# Patient Record
Sex: Female | Born: 1935 | Race: White | Smoking: Never smoker
Health system: Northeastern US, Academic
[De-identification: ages and names within clinical notes are randomized; demographics above are authoritative.]

## PROBLEM LIST (undated history)

## (undated) DIAGNOSIS — M1990 Unspecified osteoarthritis, unspecified site: Secondary | ICD-10-CM

## (undated) DIAGNOSIS — I341 Nonrheumatic mitral (valve) prolapse: Secondary | ICD-10-CM

## (undated) DIAGNOSIS — G5603 Carpal tunnel syndrome, bilateral upper limbs: Secondary | ICD-10-CM

## (undated) DIAGNOSIS — G4733 Obstructive sleep apnea (adult) (pediatric): Secondary | ICD-10-CM

## (undated) DIAGNOSIS — K219 Gastro-esophageal reflux disease without esophagitis: Secondary | ICD-10-CM

## (undated) DIAGNOSIS — I10 Essential (primary) hypertension: Secondary | ICD-10-CM

## (undated) DIAGNOSIS — E559 Vitamin D deficiency, unspecified: Secondary | ICD-10-CM

## (undated) DIAGNOSIS — H269 Unspecified cataract: Secondary | ICD-10-CM

## (undated) DIAGNOSIS — Z5189 Encounter for other specified aftercare: Secondary | ICD-10-CM

## (undated) DIAGNOSIS — R0602 Shortness of breath: Secondary | ICD-10-CM

## (undated) DIAGNOSIS — I4719 Other supraventricular tachycardia: Secondary | ICD-10-CM

## (undated) DIAGNOSIS — E785 Hyperlipidemia, unspecified: Secondary | ICD-10-CM

## (undated) DIAGNOSIS — E669 Obesity, unspecified: Secondary | ICD-10-CM

## (undated) DIAGNOSIS — Z8719 Personal history of other diseases of the digestive system: Secondary | ICD-10-CM

## (undated) DIAGNOSIS — IMO0002 Reserved for concepts with insufficient information to code with codable children: Secondary | ICD-10-CM

## (undated) DIAGNOSIS — S129XXA Fracture of neck, unspecified, initial encounter: Secondary | ICD-10-CM

## (undated) DIAGNOSIS — R51 Headache: Secondary | ICD-10-CM

## (undated) DIAGNOSIS — W19XXXA Unspecified fall, initial encounter: Secondary | ICD-10-CM

## (undated) DIAGNOSIS — I340 Nonrheumatic mitral (valve) insufficiency: Secondary | ICD-10-CM

## (undated) DIAGNOSIS — IMO0001 Reserved for inherently not codable concepts without codable children: Secondary | ICD-10-CM

## (undated) DIAGNOSIS — S0990XA Unspecified injury of head, initial encounter: Secondary | ICD-10-CM

## (undated) DIAGNOSIS — M8580 Other specified disorders of bone density and structure, unspecified site: Secondary | ICD-10-CM

## (undated) DIAGNOSIS — G5602 Carpal tunnel syndrome, left upper limb: Secondary | ICD-10-CM

## (undated) DIAGNOSIS — K259 Gastric ulcer, unspecified as acute or chronic, without hemorrhage or perforation: Secondary | ICD-10-CM

## (undated) DIAGNOSIS — M75100 Unspecified rotator cuff tear or rupture of unspecified shoulder, not specified as traumatic: Secondary | ICD-10-CM

## (undated) DIAGNOSIS — J302 Other seasonal allergic rhinitis: Secondary | ICD-10-CM

## (undated) DIAGNOSIS — I272 Pulmonary hypertension, unspecified: Secondary | ICD-10-CM

## (undated) DIAGNOSIS — I2510 Atherosclerotic heart disease of native coronary artery without angina pectoris: Secondary | ICD-10-CM

## (undated) DIAGNOSIS — I050 Rheumatic mitral stenosis: Secondary | ICD-10-CM

## (undated) DIAGNOSIS — Z9989 Dependence on other enabling machines and devices: Secondary | ICD-10-CM

## (undated) HISTORY — DX: Unspecified osteoarthritis, unspecified site: M19.90

## (undated) HISTORY — DX: Essential (primary) hypertension: I10

## (undated) HISTORY — DX: Unspecified cataract: H26.9

## (undated) HISTORY — DX: Gastro-esophageal reflux disease without esophagitis: K21.9

## (undated) HISTORY — DX: Nonrheumatic mitral (valve) prolapse: I34.1

## (undated) HISTORY — DX: Obstructive sleep apnea (adult) (pediatric): G47.33

## (undated) HISTORY — DX: Vitamin D deficiency, unspecified: E55.9

## (undated) HISTORY — DX: Carpal tunnel syndrome, bilateral upper limbs: G56.03

## (undated) HISTORY — DX: Rheumatic mitral stenosis: I05.0

## (undated) HISTORY — PX: LIPOMA EXCISION: SHX5283

## (undated) HISTORY — PX: TUBAL LIGATION: SHX77

## (undated) HISTORY — DX: Nonrheumatic mitral (valve) insufficiency: I34.0

## (undated) HISTORY — DX: Obesity, unspecified: E66.9

## (undated) HISTORY — DX: Atherosclerotic heart disease of native coronary artery without angina pectoris: I25.10

## (undated) HISTORY — PX: OTHER SURGICAL HISTORY: SHX169

## (undated) HISTORY — DX: Gastric ulcer, unspecified as acute or chronic, without hemorrhage or perforation: K25.9

## (undated) HISTORY — DX: Obstructive sleep apnea (adult) (pediatric): Z99.89

## (undated) HISTORY — PX: THUMB FUSION: SUR636

## (undated) HISTORY — DX: Pulmonary hypertension, unspecified: I27.20

## (undated) HISTORY — PX: EYE SURGERY: SHX253

## (undated) HISTORY — PX: CATARACT EXTRACTION: SUR2

## (undated) HISTORY — DX: Other supraventricular tachycardia: I47.19

## (undated) HISTORY — DX: Other specified disorders of bone density and structure, unspecified site: M85.80

---
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
## 1989-01-05 DIAGNOSIS — IMO0002 Reserved for concepts with insufficient information to code with codable children: Secondary | ICD-10-CM

## 1989-01-05 HISTORY — DX: Reserved for concepts with insufficient information to code with codable children: IMO0002

## 2007-06-06 ENCOUNTER — Encounter: Admission: RE | Admit: 2007-06-06 | Discharge: 2007-06-06 | Payer: Self-pay | Admitting: Internal Medicine

## 2007-12-08 ENCOUNTER — Other Ambulatory Visit: Admission: RE | Admit: 2007-12-08 | Discharge: 2007-12-08 | Payer: Self-pay | Admitting: Internal Medicine

## 2008-07-18 ENCOUNTER — Encounter: Admission: RE | Admit: 2008-07-18 | Discharge: 2008-07-18 | Payer: Self-pay | Admitting: Internal Medicine

## 2009-04-14 ENCOUNTER — Encounter: Admission: RE | Admit: 2009-04-14 | Discharge: 2009-04-14 | Payer: Self-pay | Admitting: Neurology

## 2009-05-07 ENCOUNTER — Ambulatory Visit: Payer: Self-pay | Admitting: Obstetrics and Gynecology

## 2009-05-07 ENCOUNTER — Inpatient Hospital Stay (HOSPITAL_COMMUNITY): Admission: AD | Admit: 2009-05-07 | Discharge: 2009-05-07 | Payer: Self-pay | Admitting: Family Medicine

## 2009-05-09 ENCOUNTER — Inpatient Hospital Stay (HOSPITAL_COMMUNITY): Admission: AD | Admit: 2009-05-09 | Discharge: 2009-05-09 | Payer: Self-pay | Admitting: Obstetrics & Gynecology

## 2009-06-05 ENCOUNTER — Emergency Department (HOSPITAL_COMMUNITY): Admission: EM | Admit: 2009-06-05 | Discharge: 2009-06-05 | Payer: Self-pay | Admitting: Emergency Medicine

## 2009-11-05 ENCOUNTER — Encounter: Admission: RE | Admit: 2009-11-05 | Discharge: 2009-11-05 | Payer: Self-pay | Admitting: Orthopaedic Surgery

## 2010-03-25 LAB — URINALYSIS, ROUTINE W REFLEX MICROSCOPIC
Bilirubin Urine: NEGATIVE
Glucose, UA: NEGATIVE mg/dL
Ketones, ur: NEGATIVE mg/dL
Leukocytes, UA: NEGATIVE
Nitrite: NEGATIVE
Protein, ur: NEGATIVE mg/dL
Specific Gravity, Urine: <1.005 — ABNORMAL LOW (ref 1.005–1.030)
Urobilinogen, UA: 0.2 mg/dL (ref 0.0–1.0)
pH: 6 (ref 5.0–8.0)

## 2010-03-25 LAB — CBC
HCT: 43.1 % (ref 36.0–46.0)
Hemoglobin: 14.9 g/dL (ref 12.0–15.0)
MCHC: 34.6 g/dL (ref 30.0–36.0)
MCV: 94.1 fL (ref 78.0–100.0)
Platelets: 234 10*3/uL (ref 150–400)
RBC: 4.57 MIL/uL (ref 3.87–5.11)
RDW: 12.6 % (ref 11.5–15.5)
WBC: 7.3 10*3/uL (ref 4.0–10.5)

## 2010-03-25 LAB — URINE MICROSCOPIC-ADD ON

## 2010-03-25 LAB — WET PREP, GENITAL
Clue Cells Wet Prep HPF POC: NONE SEEN
Trich, Wet Prep: NONE SEEN
WBC, Wet Prep HPF POC: NONE SEEN
Yeast Wet Prep HPF POC: NONE SEEN

## 2010-03-25 LAB — GC/CHLAMYDIA PROBE AMP, GENITAL
Chlamydia, DNA Probe: NEGATIVE
GC Probe Amp, Genital: NEGATIVE

## 2010-03-25 LAB — TSH: TSH: 1.477 u[IU]/mL (ref 0.350–4.500)

## 2011-03-04 ENCOUNTER — Other Ambulatory Visit: Payer: Self-pay | Admitting: Orthopaedic Surgery

## 2011-03-10 ENCOUNTER — Encounter (HOSPITAL_COMMUNITY): Payer: Self-pay | Admitting: Pharmacy Technician

## 2011-03-12 NOTE — Pre-Procedure Instructions (Signed)
20 Sydney Booth  03/12/2011   Your procedure is scheduled on:  Tuesday, March 19th.  Report to [facility] at 6:30 AM.  Call this number if you have problems the morning of surgery: [phone]   Remember:   Do not eat food:After Midnight.  May have clear liquids: up to 4 Hours before arrival. (2:30) (2:30am) Clear liquids include soda, tea, black coffee, apple or grape juice, broth.   Take these medicines the morning of surgery with A SIP OF WATER:  Amlodipine (Norvasc), Omeprazole (Prilosec), Propranolol (Inderal LA)   Do not wear jewelry, make-up or nail polish.  Do not wear lotions, powders, or perfumes. You may wear deodorant.  Do not shave 48 hours prior to surgery.  Do not bring valuables to the hospital.  Contacts, dentures or bridgework may not be worn into surgery.  Leave suitcase in the car. After surgery it may be brought to your room.  For patients admitted to the hospital, checkout time is 11:00 AM the day of discharge.   Patients discharged the day of surgery will not be allowed to drive home.  Name and phone number of your driver: --  Special Instructions: CHG Shower Use Special Wash: 1/2 bottle night before surgery and 1/2 bottle morning of surgery.   Please read over the following fact sheets that you were given: Pain Booklet, Coughing and Deep Breathing, Blood Transfusion Information, MRSA Information and Surgical Site Infection Prevention

## 2011-03-13 ENCOUNTER — Encounter (HOSPITAL_COMMUNITY)
Admission: RE | Admit: 2011-03-13 | Discharge: 2011-03-13 | Disposition: A | Discharge status: Home / Self Care | Payer: Medicare Other | Source: Ambulatory Visit | Attending: Orthopaedic Surgery | Admitting: Orthopaedic Surgery

## 2011-03-13 ENCOUNTER — Encounter (HOSPITAL_COMMUNITY): Payer: Self-pay

## 2011-03-13 ENCOUNTER — Other Ambulatory Visit: Payer: Self-pay

## 2011-03-13 HISTORY — DX: Unspecified osteoarthritis, unspecified site: M19.90

## 2011-03-13 HISTORY — DX: Unspecified injury of head, initial encounter: S09.90XA

## 2011-03-13 HISTORY — DX: Reserved for concepts with insufficient information to code with codable children: IMO0002

## 2011-03-13 HISTORY — DX: Headache: R51

## 2011-03-13 HISTORY — DX: Reserved for inherently not codable concepts without codable children: IMO0001

## 2011-03-13 HISTORY — DX: Personal history of other diseases of the digestive system: Z87.19

## 2011-03-13 HISTORY — DX: Other seasonal allergic rhinitis: J30.2

## 2011-03-13 HISTORY — DX: Nonrheumatic mitral (valve) prolapse: I34.1

## 2011-03-13 HISTORY — DX: Encounter for other specified aftercare: Z51.89

## 2011-03-13 HISTORY — DX: Unspecified rotator cuff tear or rupture of unspecified shoulder, not specified as traumatic: M75.100

## 2011-03-13 HISTORY — DX: Shortness of breath: R06.02

## 2011-03-13 HISTORY — DX: Fracture of neck, unspecified, initial encounter: S12.9XXA

## 2011-03-13 HISTORY — DX: Essential (primary) hypertension: I10

## 2011-03-13 HISTORY — DX: Hyperlipidemia, unspecified: E78.5

## 2011-03-13 HISTORY — DX: Gastro-esophageal reflux disease without esophagitis: K21.9

## 2011-03-13 LAB — DIFFERENTIAL
Basophils Absolute: 0 10*3/uL (ref 0.0–0.1)
Basophils Relative: 0 % (ref 0–1)
Eosinophils Absolute: 0.1 10*3/uL (ref 0.0–0.7)
Eosinophils Relative: 1 % (ref 0–5)
Lymphocytes Relative: 23 % (ref 12–46)
Lymphs Abs: 1.5 10*3/uL (ref 0.7–4.0)
Monocytes Absolute: 0.5 10*3/uL (ref 0.1–1.0)
Monocytes Relative: 7 % (ref 3–12)
Neutro Abs: 4.6 10*3/uL (ref 1.7–7.7)
Neutrophils Relative %: 69 % (ref 43–77)

## 2011-03-13 LAB — BASIC METABOLIC PANEL
BUN: 11 mg/dL (ref 6–23)
CO2: 29 mEq/L (ref 19–32)
Calcium: 9.8 mg/dL (ref 8.4–10.5)
Chloride: 99 mEq/L (ref 96–112)
Creatinine, Ser: 0.83 mg/dL (ref 0.50–1.10)
GFR calc Af Amer: 77 mL/min — ABNORMAL LOW (ref >90)
GFR calc non Af Amer: 66 mL/min — ABNORMAL LOW (ref >90)
Glucose, Bld: 104 mg/dL — ABNORMAL HIGH (ref 70–99)
Potassium: 4.4 mEq/L (ref 3.5–5.1)
Sodium: 137 mEq/L (ref 135–145)

## 2011-03-13 LAB — URINALYSIS, ROUTINE W REFLEX MICROSCOPIC
Bilirubin Urine: NEGATIVE
Glucose, UA: NEGATIVE mg/dL
Hgb urine dipstick: NEGATIVE
Ketones, ur: NEGATIVE mg/dL
Leukocytes, UA: NEGATIVE
Nitrite: NEGATIVE
Protein, ur: NEGATIVE mg/dL
Specific Gravity, Urine: 1.006 (ref 1.005–1.030)
Urobilinogen, UA: 0.2 mg/dL (ref 0.0–1.0)
pH: 7 (ref 5.0–8.0)

## 2011-03-13 LAB — SURGICAL PCR SCREEN
MRSA, PCR: NEGATIVE
Staphylococcus aureus: NEGATIVE

## 2011-03-13 LAB — CBC
HCT: 41.7 % (ref 36.0–46.0)
Hemoglobin: 13.8 g/dL (ref 12.0–15.0)
MCH: 30.3 pg (ref 26.0–34.0)
MCHC: 33.1 g/dL (ref 30.0–36.0)
MCV: 91.6 fL (ref 78.0–100.0)
Platelets: 272 10*3/uL (ref 150–400)
RBC: 4.55 MIL/uL (ref 3.87–5.11)
RDW: 12.7 % (ref 11.5–15.5)
WBC: 6.6 10*3/uL (ref 4.0–10.5)

## 2011-03-13 LAB — PROTIME-INR
INR: 1.13 (ref 0.00–1.49)
Prothrombin Time: 14.7 seconds (ref 11.6–15.2)

## 2011-03-13 LAB — APTT: aPTT: 30 seconds (ref 24–37)

## 2011-03-13 NOTE — Progress Notes (Signed)
Requested 2 D-Echo from Dr Forest Gleason office.

## 2011-03-19 NOTE — H&P (Signed)
Sydney Booth is an 78 y.o. female.   Chief Complaint: Left knee pain HPI: pt has a long history of severe left knee pain. She is having pain with every step and now night pain. She has tried nsaid's , cortisone and supartz all with no real benefit. X-ray reveal bone on bone left knee DJD. We have discussed options with her and will go forward with a TKR.  Past Medical History  Diagnosis Date  . Hypertension   . Blood transfusion   . Hyperlipemia   . Headache   . Closed head injury     as a chlid  . Cervical vertebral fracture     fued on its on  . Shortness of breath     With activity  . Seasonal allergies   . GERD (gastroesophageal reflux disease)   . Arthritis   . H/O hiatal hernia   . Ulcer 1991    Bledding   . Rotator cuff tear     Left  . Mitral valve prolapse     Past Surgical History  Procedure Date  . Carparal tunnel   . Thumb fusion   . Eye surgery     Right 2011, Left 2008  . Colonscopy   . Biospy    . Biospy      Uterus wall    Family History  Problem Relation Age of Onset  . Anesthesia problems Neg Hx    Social History:  reports that she has never smoked. She does not have any smokeless tobacco history on file. She reports that she does not drink alcohol or use illicit drugs.  Allergies:  Allergies  Allergen Reactions  . Sulfa Antibiotics Hives  . Food     Milk  Dariey products    No current facility-administered medications on file as of .   No current outpatient prescriptions on file as of .    No results found for this or any previous visit (from the past 48 hour(s)). No results found.  Review of Systems  Constitutional: Negative.   HENT: Negative.   Eyes: Negative.   Respiratory: Negative.   Cardiovascular: Negative.        History of MVP  Gastrointestinal: Negative.   Genitourinary: Negative.   Musculoskeletal: Negative.   Skin: Negative.   Neurological: Negative.   Endo/Heme/Allergies: Negative.   Psychiatric/Behavioral:  Negative.     There were no vitals taken for this visit. Physical Exam  Constitutional: She appears well-developed.  HENT:  Head: Normocephalic.  Eyes: Pupils are equal, round, and reactive to light.  Neck: Normal range of motion.  Cardiovascular: Normal rate and normal heart sounds.   Respiratory: Effort normal and breath sounds normal.  GI: Soft.  Musculoskeletal:       Left knee: ROM 0-110. 1+effusion pain along the medial joint line. 1+ crepitation  Neurological: She is alert.  Skin: Skin is warm.  Psychiatric: She has a normal mood and affect.     Assessment/Plan A: Left knee end stage DJD P: The plan is to go forward with a L TKR. Risks are discussed with the patient.  Sydney Booth R 03/19/2011, 12:55 PM

## 2011-03-23 MED ORDER — CEFAZOLIN SODIUM-DEXTROSE 2-3 GM-% IV SOLR
2.0000 g | INTRAVENOUS | Status: AC
  Administered 2011-03-24: 2 g via INTRAVENOUS
  Filled 2011-03-23: qty 50

## 2011-03-24 ENCOUNTER — Ambulatory Visit (HOSPITAL_COMMUNITY): Payer: Medicare Other | Admitting: Anesthesiology

## 2011-03-24 ENCOUNTER — Encounter (HOSPITAL_COMMUNITY): Payer: Self-pay | Admitting: Anesthesiology

## 2011-03-24 ENCOUNTER — Encounter (HOSPITAL_COMMUNITY)
Admission: RE | Disposition: A | Discharge status: Home Health | Payer: Self-pay | Source: Ambulatory Visit | Attending: Orthopaedic Surgery

## 2011-03-24 ENCOUNTER — Inpatient Hospital Stay (HOSPITAL_COMMUNITY)
Admission: RE | Admit: 2011-03-24 | Discharge: 2011-03-27 | DRG: 470 | Disposition: A | Discharge status: Home Health | Payer: Medicare Other | Source: Ambulatory Visit | Attending: Orthopaedic Surgery | Admitting: Orthopaedic Surgery

## 2011-03-24 ENCOUNTER — Encounter (HOSPITAL_COMMUNITY): Payer: Self-pay | Admitting: *Deleted

## 2011-03-24 DIAGNOSIS — M1710 Unilateral primary osteoarthritis, unspecified knee: Principal | ICD-10-CM | POA: Diagnosis present

## 2011-03-24 DIAGNOSIS — Z91011 Allergy to milk products: Secondary | ICD-10-CM

## 2011-03-24 DIAGNOSIS — Z882 Allergy status to sulfonamides status: Secondary | ICD-10-CM

## 2011-03-24 DIAGNOSIS — M1711 Unilateral primary osteoarthritis, right knee: Secondary | ICD-10-CM | POA: Diagnosis present

## 2011-03-24 DIAGNOSIS — I10 Essential (primary) hypertension: Secondary | ICD-10-CM | POA: Diagnosis present

## 2011-03-24 DIAGNOSIS — M1712 Unilateral primary osteoarthritis, left knee: Secondary | ICD-10-CM

## 2011-03-24 DIAGNOSIS — K219 Gastro-esophageal reflux disease without esophagitis: Secondary | ICD-10-CM | POA: Diagnosis present

## 2011-03-24 DIAGNOSIS — Z6841 Body Mass Index (BMI) 40.0 and over, adult: Secondary | ICD-10-CM

## 2011-03-24 DIAGNOSIS — Z7901 Long term (current) use of anticoagulants: Secondary | ICD-10-CM

## 2011-03-24 HISTORY — PX: TOTAL KNEE ARTHROPLASTY: SHX125

## 2011-03-24 LAB — TYPE AND SCREEN
ABO/RH(D): O POS
Antibody Screen: POSITIVE

## 2011-03-24 SURGERY — ARTHROPLASTY, KNEE, TOTAL
Anesthesia: Monitor Anesthesia Care | Site: Knee | Laterality: Left | Wound class: Clean

## 2011-03-24 MED ORDER — ENOXAPARIN SODIUM 30 MG/0.3ML ~~LOC~~ SOLN
30.0000 mg | Freq: Two times a day (BID) | SUBCUTANEOUS | Status: DC
  Administered 2011-03-24 – 2011-03-26 (×4): 30 mg via SUBCUTANEOUS
  Filled 2011-03-24 (×5): qty 0.3

## 2011-03-24 MED ORDER — BUPIVACAINE-EPINEPHRINE PF 0.5-1:200000 % IJ SOLN
INTRAMUSCULAR | Status: DC | PRN
  Administered 2011-03-24: 25 mL

## 2011-03-24 MED ORDER — LACTATED RINGERS IV SOLN
INTRAVENOUS | Status: DC
  Administered 2011-03-24 – 2011-03-25 (×2): via INTRAVENOUS

## 2011-03-24 MED ORDER — MIDAZOLAM HCL 2 MG/2ML IJ SOLN: 1.0000 mg | INTRAMUSCULAR | Status: DC | PRN

## 2011-03-24 MED ORDER — LIDOCAINE HCL (CARDIAC) 20 MG/ML IV SOLN
INTRAVENOUS | Status: DC | PRN
  Administered 2011-03-24: 80 mg via INTRAVENOUS

## 2011-03-24 MED ORDER — METOCLOPRAMIDE HCL 5 MG PO TABS
5.0000 mg | ORAL_TABLET | Freq: Three times a day (TID) | ORAL | Status: DC | PRN
  Filled 2011-03-24: qty 2

## 2011-03-24 MED ORDER — METHOCARBAMOL 100 MG/ML IJ SOLN
500.0000 mg | Freq: Four times a day (QID) | INTRAVENOUS | Status: DC | PRN
  Filled 2011-03-24: qty 5

## 2011-03-24 MED ORDER — CEFAZOLIN SODIUM-DEXTROSE 2-3 GM-% IV SOLR
2.0000 g | Freq: Four times a day (QID) | INTRAVENOUS | Status: AC
  Administered 2011-03-24 – 2011-03-25 (×3): 2 g via INTRAVENOUS
  Filled 2011-03-24 (×3): qty 50

## 2011-03-24 MED ORDER — EZETIMIBE 10 MG PO TABS
10.0000 mg | ORAL_TABLET | Freq: Every day | ORAL | Status: DC
  Administered 2011-03-25 – 2011-03-27 (×3): 10 mg via ORAL
  Filled 2011-03-24 (×3): qty 1

## 2011-03-24 MED ORDER — PANTOPRAZOLE SODIUM 40 MG PO TBEC
40.0000 mg | DELAYED_RELEASE_TABLET | Freq: Every day | ORAL | Status: DC
  Administered 2011-03-25 – 2011-03-27 (×3): 40 mg via ORAL
  Filled 2011-03-24 (×3): qty 1

## 2011-03-24 MED ORDER — GLYCOPYRROLATE 0.2 MG/ML IJ SOLN
INTRAMUSCULAR | Status: DC | PRN
  Administered 2011-03-24: .4 mg via INTRAVENOUS

## 2011-03-24 MED ORDER — MIDAZOLAM HCL 5 MG/5ML IJ SOLN
INTRAMUSCULAR | Status: DC | PRN
  Administered 2011-03-24: 2 mg via INTRAVENOUS

## 2011-03-24 MED ORDER — ACETAMINOPHEN 325 MG PO TABS: 650.0000 mg | ORAL_TABLET | Freq: Four times a day (QID) | ORAL | Status: DC | PRN

## 2011-03-24 MED ORDER — DOCUSATE SODIUM 100 MG PO CAPS
100.0000 mg | ORAL_CAPSULE | Freq: Two times a day (BID) | ORAL | Status: DC
  Administered 2011-03-24 – 2011-03-27 (×6): 100 mg via ORAL
  Filled 2011-03-24 (×6): qty 1

## 2011-03-24 MED ORDER — FLEET ENEMA 7-19 GM/118ML RE ENEM: 1.0000 | ENEMA | Freq: Once | RECTAL | Status: AC | PRN

## 2011-03-24 MED ORDER — DEXTROSE-NACL 5-0.45 % IV SOLN: INTRAVENOUS | Status: DC

## 2011-03-24 MED ORDER — NEOSTIGMINE METHYLSULFATE 1 MG/ML IJ SOLN
INTRAMUSCULAR | Status: DC | PRN
  Administered 2011-03-24: 3 mg via INTRAVENOUS

## 2011-03-24 MED ORDER — ALUM & MAG HYDROXIDE-SIMETH 200-200-20 MG/5ML PO SUSP: 30.0000 mL | ORAL | Status: DC | PRN

## 2011-03-24 MED ORDER — METOCLOPRAMIDE HCL 5 MG/ML IJ SOLN
5.0000 mg | Freq: Three times a day (TID) | INTRAMUSCULAR | Status: DC | PRN
  Administered 2011-03-24: 10 mg via INTRAVENOUS
  Filled 2011-03-24: qty 2

## 2011-03-24 MED ORDER — ACETAMINOPHEN 650 MG RE SUPP: 650.0000 mg | Freq: Four times a day (QID) | RECTAL | Status: DC | PRN

## 2011-03-24 MED ORDER — ROCURONIUM BROMIDE 100 MG/10ML IV SOLN
INTRAVENOUS | Status: DC | PRN
  Administered 2011-03-24: 50 mg via INTRAVENOUS

## 2011-03-24 MED ORDER — ONDANSETRON HCL 4 MG/2ML IJ SOLN
INTRAMUSCULAR | Status: AC
  Filled 2011-03-24: qty 2

## 2011-03-24 MED ORDER — DIPHENHYDRAMINE HCL 12.5 MG/5ML PO ELIX: 12.5000 mg | ORAL_SOLUTION | ORAL | Status: DC | PRN

## 2011-03-24 MED ORDER — ONDANSETRON HCL 4 MG PO TABS: 4.0000 mg | ORAL_TABLET | Freq: Four times a day (QID) | ORAL | Status: DC | PRN

## 2011-03-24 MED ORDER — HYDROMORPHONE HCL PF 1 MG/ML IJ SOLN
0.5000 mg | INTRAMUSCULAR | Status: DC | PRN
  Administered 2011-03-25: 1 mg via INTRAVENOUS
  Filled 2011-03-24: qty 1

## 2011-03-24 MED ORDER — WARFARIN SODIUM 5 MG PO TABS
5.0000 mg | ORAL_TABLET | Freq: Once | ORAL | Status: AC
  Administered 2011-03-24: 5 mg via ORAL
  Filled 2011-03-24: qty 1

## 2011-03-24 MED ORDER — MENTHOL 3 MG MT LOZG: 1.0000 | LOZENGE | OROMUCOSAL | Status: DC | PRN

## 2011-03-24 MED ORDER — WARFARIN VIDEO: Freq: Once | Status: DC

## 2011-03-24 MED ORDER — HYDROMORPHONE HCL PF 1 MG/ML IJ SOLN
INTRAMUSCULAR | Status: AC
  Filled 2011-03-24: qty 1

## 2011-03-24 MED ORDER — WARFARIN - PHARMACIST DOSING INPATIENT: Freq: Every day | Status: DC

## 2011-03-24 MED ORDER — SODIUM CHLORIDE 0.9 % IR SOLN
Status: DC | PRN
  Administered 2011-03-24: 3000 mL
  Administered 2011-03-24: 1000 mL

## 2011-03-24 MED ORDER — PHENYLEPHRINE HCL 10 MG/ML IJ SOLN
INTRAMUSCULAR | Status: DC | PRN
  Administered 2011-03-24: 40 ug via INTRAVENOUS
  Administered 2011-03-24: 80 ug via INTRAVENOUS
  Administered 2011-03-24 (×2): 40 ug via INTRAVENOUS
  Administered 2011-03-24 (×4): 80 ug via INTRAVENOUS

## 2011-03-24 MED ORDER — OXYCODONE-ACETAMINOPHEN 5-325 MG PO TABS
1.0000 | ORAL_TABLET | ORAL | Status: DC | PRN
  Administered 2011-03-24 – 2011-03-25 (×2): 2 via ORAL
  Filled 2011-03-24 (×2): qty 2

## 2011-03-24 MED ORDER — COUMADIN BOOK
Freq: Once | Status: AC
  Administered 2011-03-24: 20:00:00
  Filled 2011-03-24: qty 1

## 2011-03-24 MED ORDER — ONDANSETRON HCL 4 MG/2ML IJ SOLN
4.0000 mg | Freq: Four times a day (QID) | INTRAMUSCULAR | Status: DC | PRN
  Administered 2011-03-24: 4 mg via INTRAVENOUS
  Filled 2011-03-24: qty 2

## 2011-03-24 MED ORDER — ONDANSETRON HCL 4 MG/2ML IJ SOLN
INTRAMUSCULAR | Status: DC | PRN
  Administered 2011-03-24 (×2): 4 mg via INTRAVENOUS

## 2011-03-24 MED ORDER — CHLORHEXIDINE GLUCONATE 4 % EX LIQD
60.0000 mL | Freq: Once | CUTANEOUS | Status: DC
  Filled 2011-03-24: qty 60

## 2011-03-24 MED ORDER — METHOCARBAMOL 500 MG PO TABS
500.0000 mg | ORAL_TABLET | Freq: Four times a day (QID) | ORAL | Status: DC | PRN
  Administered 2011-03-25 – 2011-03-26 (×2): 500 mg via ORAL
  Filled 2011-03-24 (×2): qty 1

## 2011-03-24 MED ORDER — BISACODYL 5 MG PO TBEC
5.0000 mg | DELAYED_RELEASE_TABLET | Freq: Every day | ORAL | Status: DC | PRN
  Administered 2011-03-27: 5 mg via ORAL
  Filled 2011-03-24: qty 1

## 2011-03-24 MED ORDER — CEFUROXIME SODIUM 1.5 G IJ SOLR
INTRAMUSCULAR | Status: DC | PRN
  Administered 2011-03-24: 1.5 g

## 2011-03-24 MED ORDER — PROPRANOLOL HCL ER 60 MG PO CP24
60.0000 mg | ORAL_CAPSULE | Freq: Every day | ORAL | Status: DC
  Administered 2011-03-26 – 2011-03-27 (×2): 60 mg via ORAL
  Filled 2011-03-24 (×3): qty 1

## 2011-03-24 MED ORDER — FENTANYL CITRATE 0.05 MG/ML IJ SOLN: 50.0000 ug | INTRAMUSCULAR | Status: DC | PRN

## 2011-03-24 MED ORDER — PROPOFOL 10 MG/ML IV EMUL
INTRAVENOUS | Status: DC | PRN
  Administered 2011-03-24: 160 mg via INTRAVENOUS

## 2011-03-24 MED ORDER — PHENOL 1.4 % MT LIQD: 1.0000 | OROMUCOSAL | Status: DC | PRN

## 2011-03-24 MED ORDER — LACTATED RINGERS IV SOLN
INTRAVENOUS | Status: DC | PRN
  Administered 2011-03-24 (×2): via INTRAVENOUS

## 2011-03-24 MED ORDER — OXYCODONE HCL 5 MG PO TABS
5.0000 mg | ORAL_TABLET | ORAL | Status: DC | PRN
  Filled 2011-03-24 (×2): qty 2

## 2011-03-24 MED ORDER — MEDROXYPROGESTERONE ACETATE 2.5 MG PO TABS
2.5000 mg | ORAL_TABLET | Freq: Every day | ORAL | Status: DC
  Administered 2011-03-25 – 2011-03-27 (×3): 2.5 mg via ORAL
  Filled 2011-03-24 (×3): qty 1

## 2011-03-24 MED ORDER — AMLODIPINE BESYLATE 5 MG PO TABS
5.0000 mg | ORAL_TABLET | Freq: Every day | ORAL | Status: DC
  Administered 2011-03-26 – 2011-03-27 (×2): 5 mg via ORAL
  Filled 2011-03-24 (×3): qty 1

## 2011-03-24 MED ORDER — LORAZEPAM 2 MG/ML IJ SOLN: 1.0000 mg | Freq: Once | INTRAMUSCULAR | Status: DC | PRN

## 2011-03-24 MED ORDER — FENTANYL CITRATE 0.05 MG/ML IJ SOLN
INTRAMUSCULAR | Status: DC | PRN
  Administered 2011-03-24: 50 ug via INTRAVENOUS
  Administered 2011-03-24: 100 ug via INTRAVENOUS
  Administered 2011-03-24: 50 ug via INTRAVENOUS

## 2011-03-24 MED ORDER — HYDROMORPHONE HCL PF 1 MG/ML IJ SOLN
0.2500 mg | INTRAMUSCULAR | Status: DC | PRN
  Administered 2011-03-24 (×3): 0.5 mg via INTRAVENOUS

## 2011-03-24 MED ORDER — SENNOSIDES-DOCUSATE SODIUM 8.6-50 MG PO TABS: 1.0000 | ORAL_TABLET | Freq: Every evening | ORAL | Status: DC | PRN

## 2011-03-24 SURGICAL SUPPLY — 65 items
AUTOTRANSFUSION W/QD PVC DRAIN (AUTOTRANSFUSION) ×2 IMPLANT
BANDAGE ELASTIC 4 VELCRO ST LF (GAUZE/BANDAGES/DRESSINGS) ×2 IMPLANT
BANDAGE ESMARK 6X9 LF (GAUZE/BANDAGES/DRESSINGS) ×1 IMPLANT
BANDAGE GAUZE ELAST BULKY 4 IN (GAUZE/BANDAGES/DRESSINGS) ×4 IMPLANT
BLADE SAGITTAL 25.0X1.19X90 (BLADE) ×2 IMPLANT
BLADE SURG ROTATE 9660 (MISCELLANEOUS) IMPLANT
BNDG ELASTIC 6X10 VLCR STRL LF (GAUZE/BANDAGES/DRESSINGS) ×2 IMPLANT
BNDG ESMARK 6X9 LF (GAUZE/BANDAGES/DRESSINGS) ×2
BOWL SMART MIX CTS (DISPOSABLE) ×2 IMPLANT
CEMENT HV SMART SET (Cement) ×4 IMPLANT
CLOTH BEACON ORANGE TIMEOUT ST (SAFETY) ×2 IMPLANT
COVER SURGICAL LIGHT HANDLE (MISCELLANEOUS) ×2 IMPLANT
CUFF TOURNIQUET SINGLE 34IN LL (TOURNIQUET CUFF) ×2 IMPLANT
CUFF TOURNIQUET SINGLE 44IN (TOURNIQUET CUFF) IMPLANT
DRAPE EXTREMITY T 121X128X90 (DRAPE) ×2 IMPLANT
DRAPE PROXIMA HALF (DRAPES) ×2 IMPLANT
DRAPE U-SHAPE 47X51 STRL (DRAPES) ×2 IMPLANT
DRSG ADAPTIC 3X8 NADH LF (GAUZE/BANDAGES/DRESSINGS) ×2 IMPLANT
DRSG PAD ABDOMINAL 8X10 ST (GAUZE/BANDAGES/DRESSINGS) ×2 IMPLANT
DURAPREP 26ML APPLICATOR (WOUND CARE) ×2 IMPLANT
ELECT REM PT RETURN 9FT ADLT (ELECTROSURGICAL) ×2
ELECTRODE REM PT RTRN 9FT ADLT (ELECTROSURGICAL) ×1 IMPLANT
EVACUATOR 1/8 PVC DRAIN (DRAIN) ×2 IMPLANT
FACESHIELD LNG OPTICON STERILE (SAFETY) ×4 IMPLANT
GLOVE BIO SURGEON STRL SZ8.5 (GLOVE) ×2 IMPLANT
GLOVE BIOGEL PI IND STRL 8 (GLOVE) ×2 IMPLANT
GLOVE BIOGEL PI IND STRL 8.5 (GLOVE) ×1 IMPLANT
GLOVE BIOGEL PI INDICATOR 8 (GLOVE) ×2
GLOVE BIOGEL PI INDICATOR 8.5 (GLOVE) ×1
GLOVE SS BIOGEL STRL SZ 8 (GLOVE) ×3 IMPLANT
GLOVE SUPERSENSE BIOGEL SZ 8 (GLOVE) ×3
GLOVE SURG SS PI 7.5 STRL IVOR (GLOVE) ×2 IMPLANT
GOWN PREVENTION PLUS XLARGE (GOWN DISPOSABLE) ×4 IMPLANT
GOWN PREVENTION PLUS XXLARGE (GOWN DISPOSABLE) ×2 IMPLANT
GOWN STRL NON-REIN LRG LVL3 (GOWN DISPOSABLE) ×2 IMPLANT
HANDPIECE INTERPULSE COAX TIP (DISPOSABLE) ×1
HOOD PEEL AWAY FACE SHEILD DIS (HOOD) ×4 IMPLANT
IMMOBILIZER KNEE 20 (SOFTGOODS)
IMMOBILIZER KNEE 20 THIGH 36 (SOFTGOODS) IMPLANT
IMMOBILIZER KNEE 22 UNIV (SOFTGOODS) ×2 IMPLANT
IMMOBILIZER KNEE 24 THIGH 36 (MISCELLANEOUS) IMPLANT
IMMOBILIZER KNEE 24 UNIV (MISCELLANEOUS)
KIT BASIN OR (CUSTOM PROCEDURE TRAY) ×2 IMPLANT
KIT ROOM TURNOVER OR (KITS) ×2 IMPLANT
MANIFOLD NEPTUNE II (INSTRUMENTS) ×2 IMPLANT
NS IRRIG 1000ML POUR BTL (IV SOLUTION) ×2 IMPLANT
PACK TOTAL JOINT (CUSTOM PROCEDURE TRAY) ×2 IMPLANT
PAD ARMBOARD 7.5X6 YLW CONV (MISCELLANEOUS) ×4 IMPLANT
PADDING CAST ABS 4INX4YD NS (CAST SUPPLIES) ×1
PADDING CAST ABS 6INX4YD NS (CAST SUPPLIES) ×1
PADDING CAST ABS COTTON 4X4 ST (CAST SUPPLIES) ×1 IMPLANT
PADDING CAST ABS COTTON 6X4 NS (CAST SUPPLIES) ×1 IMPLANT
SET HNDPC FAN SPRY TIP SCT (DISPOSABLE) ×1 IMPLANT
SPONGE GAUZE 4X4 12PLY (GAUZE/BANDAGES/DRESSINGS) ×4 IMPLANT
STAPLER VISISTAT 35W (STAPLE) ×2 IMPLANT
SUCTION FRAZIER TIP 10 FR DISP (SUCTIONS) ×2 IMPLANT
SUT VIC AB 0 CT1 27 (SUTURE) ×2
SUT VIC AB 0 CT1 27XBRD ANBCTR (SUTURE) ×2 IMPLANT
SUT VIC AB 1 CTB1 27 (SUTURE) ×4 IMPLANT
SUT VIC AB 2-0 CT1 27 (SUTURE) ×2
SUT VIC AB 2-0 CT1 TAPERPNT 27 (SUTURE) ×2 IMPLANT
TOWEL OR 17X24 6PK STRL BLUE (TOWEL DISPOSABLE) ×2 IMPLANT
TOWEL OR 17X26 10 PK STRL BLUE (TOWEL DISPOSABLE) ×2 IMPLANT
TRAY FOLEY CATH 14FR (SET/KITS/TRAYS/PACK) ×2 IMPLANT
WATER STERILE IRR 1000ML POUR (IV SOLUTION) ×6 IMPLANT

## 2011-03-24 NOTE — Transfer of Care (Signed)
Immediate Anesthesia Transfer of Care Note  Patient: Sydney Booth  Procedure(s) Performed: Procedure(s) (LRB): TOTAL KNEE ARTHROPLASTY (Left)  Patient Location: PACU  Anesthesia Type: General  Level of Consciousness: awake, alert  and oriented  Airway & Oxygen Therapy: Patient Spontanous Breathing and Patient connected to nasal cannula oxygen  Post-op Assessment: Report given to PACU RN and Post -op Vital signs reviewed and stable  Post vital signs: Reviewed and stable  Complications: No apparent anesthesia complications

## 2011-03-24 NOTE — Progress Notes (Signed)
ANTICOAGULATION CONSULT NOTE - Initial Consult  Pharmacy Consult for Coumadin Indication: total knee replacement  Allergies  Allergen Reactions  . Sulfa Antibiotics Hives  . Food     Milk  Dariey products    Patient Measurements: Height: 5\' 1"  (154.9 cm) Weight: 221 lb 1.9 oz (100.3 kg) IBW/kg (Calculated) : 47.8  Heparin Dosing Weight:   Vital Signs: Temp: 96.5 F (35.8 C) (03/19 1700) Temp src: Oral (03/19 1700) BP: 103/66 mmHg (03/19 1700) Pulse Rate: 76  (03/19 1700)  Labs: No results found for this basename: HGB:2,HCT:3,PLT:3,APTT:3,LABPROT:3,INR:3,HEPARINUNFRC:3,CREATININE:3,CKTOTAL:3,CKMB:3,TROPONINI:3 in the last 72 hours Estimated Creatinine Clearance: 61.7 ml/min (by C-G formula based on Cr of 0.83).  Medical History: Past Medical History  Diagnosis Date  . Hypertension   . Blood transfusion   . Hyperlipemia   . Headache   . Closed head injury     as a chlid  . Cervical vertebral fracture     fued on its on  . Shortness of breath     With activity  . Seasonal allergies   . GERD (gastroesophageal reflux disease)   . Arthritis   . H/O hiatal hernia   . Ulcer 1991    Bledding   . Rotator cuff tear     Left  . Mitral valve prolapse     Medications:  Scheduled:     . amLODipine  5 mg Oral Daily  .  ceFAZolin (ANCEF) IV  2 g Intravenous 60 min Pre-Op  .  ceFAZolin (ANCEF) IV  2 g Intravenous Q6H  . docusate sodium  100 mg Oral BID  . enoxaparin  30 mg Subcutaneous Q12H  . ezetimibe  10 mg Oral Daily  . HYDROmorphone      . medroxyPROGESTERone  2.5 mg Oral Daily  . ondansetron      . pantoprazole  40 mg Oral Q1200  . propranolol  60 mg Oral Daily  . DISCONTD: chlorhexidine  60 mL Topical Once     Assessment: 78 yr old female admitted for total knee replacement. Her pre op INR was 1.13.  Goal of Therapy:  INR 2-3   Plan:  Ordered coumadin 5 mg today. Also ordered coumadin booklet and video for pt. Will f/u daily INR.  Eugene Garnet 03/24/2011,7:06 PM

## 2011-03-24 NOTE — Anesthesia Procedure Notes (Addendum)
Anesthesia Regional Block:  Femoral nerve block  Pre-Anesthetic Checklist: ,, timeout performed, Correct Patient, Correct Site, Correct Laterality, Correct Procedure, Correct Position, site marked, Risks and benefits discussed,  Surgical consent,  Pre-op evaluation,  At surgeon's request and post-op pain management  Laterality: Left  Prep: Maximum Sterile Barrier Precautions used and chloraprep       Needles:  Injection technique: Single-shot  Needle Type: Stimulator Needle - 80     Needle Length: 8cm  Needle Gauge: 22 and 22 G    Additional Needles:  Procedures: nerve stimulator Femoral nerve block  Nerve Stimulator or Paresthesia:  Response: 0.48 mA,   Additional Responses:   Narrative:  Start time: 03/24/2011 7:55 AM End time: 03/24/2011 8:08 AM Injection made incrementally with aspirations every 5 mL.  Performed by: Personally  Anesthesiologist: Dr Gypsy Balsam  Additional Notes: [id number] L FNB for POP  Sterile tech, fbp, chg prep #22 stim needle w/stim down to .48ma No blood asp, multiple times Vernia Buff .5% w/epi 1:200000 total 25 cc No compl Dr Gypsy Balsam  Interscalene brachial plexus block Procedure Name: Intubation Date/Time: 03/24/2011 8:41 AM Performed by: Quentin Ore Pre-anesthesia Checklist: Patient identified, Emergency Drugs available, Suction available, Patient being monitored and Timeout performed Patient Re-evaluated:Patient Re-evaluated prior to inductionOxygen Delivery Method: Circle system utilized Preoxygenation: Pre-oxygenation with 100% oxygen Intubation Type: IV induction Ventilation: Mask ventilation without difficulty Laryngoscope Size: Mac and 3 Grade View: Grade II Tube type: Oral Tube size: 7.0 mm Number of attempts: 1 Airway Equipment and Method: Stylet Placement Confirmation: ETT inserted through vocal cords under direct vision,  positive ETCO2 and breath sounds checked- equal and bilateral Secured at: 20 cm Tube secured with: Tape Dental  Injury: Teeth and Oropharynx as per pre-operative assessment

## 2011-03-24 NOTE — Anesthesia Postprocedure Evaluation (Signed)
  Anesthesia Post-op Note  Patient: Sydney Booth  Procedure(s) Performed: Procedure(s) (LRB): TOTAL KNEE ARTHROPLASTY (Left)  Patient Location: PACU  Anesthesia Type: GA combined with regional for post-op pain  Level of Consciousness: awake and alert   Airway and Oxygen Therapy: Patient Spontanous Breathing  Post-op Pain: mild  Post-op Assessment: Post-op Vital signs reviewed, Patient's Cardiovascular Status Stable, Respiratory Function Stable, Patent Airway and Pain level controlled  Post-op Vital Signs: stable  Complications: No apparent anesthesia complications

## 2011-03-24 NOTE — Preoperative (Signed)
Beta Blockers   Reason not to administer Beta Blockers:Not Applicable 

## 2011-03-24 NOTE — Progress Notes (Signed)
Orthopedic Tech Progress Note Patient Details:  Sydney Booth 1931-04-15 [id number]  CPM Left Knee CPM Left Knee: On Left Knee Flexion (Degrees): 50  Left Knee Extension (Degrees): 0    [name] 03/24/2011, 2:22 PM

## 2011-03-24 NOTE — Op Note (Signed)
PREOP DIAGNOSIS: DJD LEFT KNEE POSTOP DIAGNOSIS: DJD LEFT KNEE PROCEDURE: LEFT TKR ANESTHESIA: General and block ATTENDING SURGEON: Yanilen Adamik G ASSISTANT: Lindwood Qua PA  INDICATIONS FOR PROCEDURE: Sydney Booth is a 78 y.o. female who has struggled for a long time with pain due to degenerative arthritis of the left knee.  The patient has failed many conservative non-operative measures and at this point has pain which limits the ability to sleep and walk.  The patient is offered total knee replacement.  Informed operative consent was obtained after discussion of possible risks of anesthesia, infection, neurovascular injury, DVT, and death.  The importance of the post-operative rehabilitation protocol to optimize result was stressed extensively with the patient.  SUMMARY OF FINDINGS AND PROCEDURE:  Sydney Booth was taken to the operative suite where under the above anesthesia a left knee replacement was performed.  There were advanced degenerative changes and the bone quality was good.  We used the DePuy system and placed size standard femur, 3 MBT tibia, 35mm all polyethylene patella, and a size 10 spacer.  I did include zinacef antibiotic in the cement.  The patient was admitted for appropriate post-op care to include perioperative antibiotics and mechanical and pharmacologic measures for DVT prophylaxis.  DESCRIPTION OF PROCEDURE:  Sydney Booth was taken to the operative suite where the above anesthesia was applied.  The patient was positioned supine and prepped and draped in normal sterile fashion.  An appropriate time out was performed.  After the administration of kefzol pre-op antibiotic a standard longitudinal incision was made on the anterior knee.  Dissection was carried down to the extensor mechanism.  All appropriate anti-infective measures were used including the pre-operative antibiotic, betadine impregnated drape, and closed hooded exhaust systems for each member of the surgical team.  A  medial parapatellar incision was made in the extensor mechanism and the knee cap flipped and the knee flexed.  Some residual meniscal tissues were removed along with any remaining ACL/PCL tissue.  A guide was placed on the tibia and a flat cut was made on it's superior surface.  An intramedullary guide was placed in the femur and was utilized to make anterior and posterior cuts creating an appropriate flexion gap.  A second intramedullary guide was placed in the femur to make a distal cut properly balancing the knee with an extension gap equal to the flexion gap.  The three bones sized to the above mentioned sizes and the appropriate guides were placed and utilized.  A trial reduction was done and the knee easily came to full extension and the patella tracked well on flexion.  The trial components were removed and all bones were cleaned with pulsatile lavage and then dried thoroughly.  Cement was mixed including antibiotic and was pressurized onto the bones followed by placement of the aforementioned components.  Excess cement was trimmed and pressure was held on the components until the cement had hardened.  The tourniquet was deflated and a small amount of bleeding was controlled with cautery and pressure.  The knee was irrigated and a drain placed exiting superolaterally.  The extensor mechanism was re-approximated with #1 suture.  The knee was flexed and the repair was solid.  The subcutaneous tissues were re-approximated with #0 and #2-0 vicryl and the skin closed with staples.  A sterile dressing was applied.  Intraoperative fluids, EBL, and tourniquet time can be obtained from anesthesia records.  DISPOSITION:  The patient was taken to recovery room in stable condition and  admitted for appropriate post-op care to include peri-operative antibiotic and DVT prophylaxis with mechanical and pharmacologic measures.  Hilliary Jock G 03/24/2011, 10:43 AM

## 2011-03-24 NOTE — Anesthesia Preprocedure Evaluation (Addendum)
Anesthesia Evaluation  Patient identified by MRN, date of birth, ID band Patient awake    Reviewed: Allergy & Precautions, H&P , NPO status , Patient's Chart, lab work & pertinent test results  Airway Mallampati: II TM Distance: >3 FB Neck ROM: Full    Dental   Pulmonary shortness of breath,    Pulmonary exam normal       Cardiovascular hypertension,     Neuro/Psych  Headaches,  Neuromuscular disease    GI/Hepatic hiatal hernia, GERD-  ,  Endo/Other  Morbid obesity  Renal/GU      Musculoskeletal   Abdominal (+) + obese,   Peds  Hematology   Anesthesia Other Findings   Reproductive/Obstetrics                           Anesthesia Physical Anesthesia Plan  ASA: III  Anesthesia Plan: General   Post-op Pain Management:    Induction: Intravenous  Airway Management Planned: Simple Face Mask and Oral ETT  Additional Equipment:   Intra-op Plan:   Post-operative Plan: Extubation in OR  Informed Consent: I have reviewed the patients History and Physical, chart, labs and discussed the procedure including the risks, benefits and alternatives for the proposed anesthesia with the patient or authorized representative who has indicated his/her understanding and acceptance.     Plan Discussed with: CRNA and Surgeon  Anesthesia Plan Comments: (Pt very anxious Long discussion of GA w/fnb vs spinal Risk/benefits stated and understood Elected to change to GA with fnb for post op pain control Dr Gypsy Balsam)       Anesthesia Quick Evaluation

## 2011-03-24 NOTE — Progress Notes (Signed)
Orthopedic Tech Progress Note Patient Details:  Sydney Booth 1931-03-05 [id number]      [name] 03/24/2011, 2:23 PM Trapeze bar

## 2011-03-25 ENCOUNTER — Encounter (HOSPITAL_COMMUNITY): Payer: Self-pay | Admitting: *Deleted

## 2011-03-25 LAB — BASIC METABOLIC PANEL
BUN: 12 mg/dL (ref 6–23)
CO2: 28 mEq/L (ref 19–32)
Calcium: 8.7 mg/dL (ref 8.4–10.5)
Chloride: 98 mEq/L (ref 96–112)
Creatinine, Ser: 0.69 mg/dL (ref 0.50–1.10)
GFR calc Af Amer: >90 mL/min (ref >90)
GFR calc non Af Amer: 82 mL/min — ABNORMAL LOW (ref >90)
Glucose, Bld: 129 mg/dL — ABNORMAL HIGH (ref 70–99)
Potassium: 3.7 mEq/L (ref 3.5–5.1)
Sodium: 133 mEq/L — ABNORMAL LOW (ref 135–145)

## 2011-03-25 LAB — CBC
HCT: 31.8 % — ABNORMAL LOW (ref 36.0–46.0)
Hemoglobin: 10.9 g/dL — ABNORMAL LOW (ref 12.0–15.0)
MCH: 30.7 pg (ref 26.0–34.0)
MCHC: 34.3 g/dL (ref 30.0–36.0)
MCV: 89.6 fL (ref 78.0–100.0)
Platelets: 245 10*3/uL (ref 150–400)
RBC: 3.55 MIL/uL — ABNORMAL LOW (ref 3.87–5.11)
RDW: 12.7 % (ref 11.5–15.5)
WBC: 10 10*3/uL (ref 4.0–10.5)

## 2011-03-25 LAB — PROTIME-INR
INR: 1.19 (ref 0.00–1.49)
Prothrombin Time: 15.4 seconds — ABNORMAL HIGH (ref 11.6–15.2)

## 2011-03-25 MED ORDER — HYDROCODONE-ACETAMINOPHEN 5-325 MG PO TABS
1.0000 | ORAL_TABLET | ORAL | Status: DC | PRN
  Administered 2011-03-25 (×3): 2 via ORAL
  Administered 2011-03-26: 1 via ORAL
  Administered 2011-03-26 (×2): 2 via ORAL
  Administered 2011-03-27 (×2): 1 via ORAL
  Filled 2011-03-25 (×2): qty 1
  Filled 2011-03-25 (×6): qty 2

## 2011-03-25 MED ORDER — WARFARIN SODIUM 5 MG PO TABS
5.0000 mg | ORAL_TABLET | Freq: Once | ORAL | Status: AC
  Administered 2011-03-25: 5 mg via ORAL
  Filled 2011-03-25: qty 1

## 2011-03-25 NOTE — Progress Notes (Signed)
Physical Therapy Note   03/25/11 1728  PT Visit Information  Last PT Received On 03/25/11  Precautions  Precautions Knee  Knee Immobilizer (Used KI for knee stability in standing)  Restrictions  LLE Weight Bearing WBAT  Bed Mobility  Supine to Sit 4: Min assist;With rails;HOB elevated (Comment degrees)  Supine to Sit Details (indicate cue type and reason) cues for technique  Sitting - Scoot to Edge of Bed 4: Min assist;With rail  Sitting - Scoot to [place] of Bed Details (indicate cue type and reason) safety cues  Transfers  Sit to Stand 4: Min assist;From bed;With upper extremity assist (guard assist)  Sit to Stand Details (indicate cue type and reason) safety cues  Stand to Sit 4: Min assist;With upper extremity assist;With armrests;To chair/3-in-1  Stand to Sit Details safety, positioning cues  Ambulation/Gait  Ambulation/Gait Yes  Ambulation/Gait Assistance 4: Min assist  Ambulation/Gait Assistance Details (indicate cue type and reason) cues fro gait sequence and turning RW/directional cues; pt reports slightly dizzy, but better activity tol than am session  Ambulation Distance (Feet) 6 Feet  Assistive device Rolling walker  Gait Pattern Antalgic  PT - End of Session  Equipment Utilized During Treatment Gait belt;Left knee immobilizer  Activity Tolerance Patient tolerated treatment well (improved from am session)  Patient left in chair;with call bell in reach  Nurse Communication Mobility status for transfers  General  Behavior During Session [facility] for tasks performed  Cognition [facility] for tasks performed  PT - Assessment/Plan  Comments on Treatment Session Pt was able to tolerate more upright activity with less reports of dizziness  PT Plan Discharge plan remains appropriate  PT Frequency 7X/week  Recommendations for Other Services OT consult  Follow Up Recommendations Home health PT;Supervision/Assistance - 24 hour  Equipment Recommended Rolling walker with 5" wheels;3 in 1  bedside comode  Acute Rehab PT Goals  Time For Goal Achievement 7 days  Pt will go Supine/Side to Sit with supervision  PT Goal: Supine/Side to Sit - Progress Progressing toward goal  Pt will go Sit to Supine/Side with supervision  Pt will go Sit to Stand with supervision  PT Goal: Sit to Stand - Progress Progressing toward goal  Pt will go Stand to Sit with supervision  PT Goal: Stand to Sit - Progress Progressing toward goal  Pt will Ambulate >150 feet;with supervision;with rolling walker  PT Goal: Ambulate - Progress Progressing toward goal (slow)    Van Clines, [place] [phone]\

## 2011-03-25 NOTE — Progress Notes (Signed)
Subjective: 1 Day Post-Op Procedure(s) (LRB): TOTAL KNEE ARTHROPLASTY (Left)  Activity level:  In bed doing ok Diet tolerance:  Light eating Voiding:  ok Patient reports pain as 2 on 0-10 scale.    Objective: Vital signs in last 24 hours: Temp:  [96.5 F (35.8 C)-98 F (36.7 C)] 97.8 F (36.6 C) (03/20 0559) Pulse Rate:  [55-82] 76  (03/20 0559) Resp:  [3-27] 18  (03/20 0559) BP: (100-126)/(50-75) 106/73 mmHg (03/20 0559) SpO2:  [95 %-100 %] 99 % (03/20 0559) Weight:  [100.3 kg (221 lb 1.9 oz)] 100.3 kg (221 lb 1.9 oz) (03/19 1700)  Intake/Output from previous day: 03/19 0701 - 03/20 0700 In: 2240 [P.O.:240; I.V.:1400] Out: 1325 [Urine:725; Drains:450; Blood:150] Intake/Output this shift:     Basename 03/25/11 0615  HGB 10.9*    Basename 03/25/11 0615  WBC 10.0  RBC 3.55*  HCT 31.8*  PLT 245    Basename 03/25/11 0615  NA 133*  K 3.7  CL 98  CO2 28  BUN 12  CREATININE 0.69  GLUCOSE 129*  CALCIUM 8.7    Basename 03/25/11 0615  LABPT --  INR 1.19    Physical Exam:  Neurologically intact ABD soft Neurovascular intact Sensation intact distally Intact pulses distally Dorsiflexion/Plantar flexion intact No cellulitis present Compartment soft Drain still in  Assessment/Plan: 1 Day Post-Op Procedure(s) (LRB): TOTAL KNEE ARTHROPLASTY (Left) Advance diet Up with therapy Will stop percocet and change to norco  Alvine Mostafa R 03/25/2011, 8:34 AM

## 2011-03-25 NOTE — Progress Notes (Signed)
Physical Therapy Evaluation Patient Details Name: Sydney Booth MRN: [id number] DOB: 1931/08/04 Today's Date: 03/25/2011  Problem List:  Patient Active Problem List  Diagnoses  . Left knee DJD  . HTN (hypertension)    Past Medical History:  Past Medical History  Diagnosis Date  . Hypertension   . Blood transfusion   . Hyperlipemia   . Headache   . Closed head injury     as a chlid  . Cervical vertebral fracture     fued on its on  . Shortness of breath     With activity  . Seasonal allergies   . GERD (gastroesophageal reflux disease)   . Arthritis   . H/O hiatal hernia   . Ulcer 1991    Bledding   . Rotator cuff tear     Left  . Mitral valve prolapse    Past Surgical History:  Past Surgical History  Procedure Date  . Carparal tunnel   . Thumb fusion   . Eye surgery     Right 2011, Left 2008  . Colonscopy   . Biospy    . Biospy      Uterus wall  . Tubal ligation   . Total knee arthroplasty 03/24/2011    Procedure: TOTAL KNEE ARTHROPLASTY;  Surgeon: Velna Ochs, MD;  Location: [facility] OR;  Service: Orthopedics;  Laterality: Left;  with revision tibial component    PT Assessment/Plan/Recommendation PT Assessment Clinical Impression Statement: 78 yo female s/p LTKA presents with decr functional mobilty, decr activity tol; will benefit from PT to maximize independence and safety with mobility, amb, activity tol to enable safe dc home PT Recommendation/Assessment: Patient will need skilled PT in the acute care venue PT Problem List: Decreased strength;Decreased range of motion;Decreased activity tolerance;Decreased balance;Decreased mobility;Decreased coordination;Decreased knowledge of use of DME;Obesity;Pain PT Therapy Diagnosis : Difficulty walking;Acute pain PT Plan PT Frequency: 7X/week PT Treatment/Interventions: DME instruction;Gait training;Stair training;Functional mobility training;Therapeutic activities;Therapeutic exercise;Patient/family education PT  Recommendation Follow Up Recommendations: Home health PT;Supervision/Assistance - 24 hour Equipment Recommended: Rolling walker with 5" wheels;3 in 1 bedside comode (short, wide RW) PT Goals  Acute Rehab PT Goals PT Goal Formulation: With patient Time For Goal Achievement: 7 days Pt will go Supine/Side to Sit: with supervision PT Goal: Supine/Side to Sit - Progress: Goal set today Pt will go Sit to Supine/Side: with supervision PT Goal: Sit to Supine/Side - Progress: Goal set today Pt will go Sit to Stand: with supervision PT Goal: Sit to Stand - Progress: Goal set today Pt will go Stand to Sit: with supervision PT Goal: Stand to Sit - Progress: Goal set today Pt will Ambulate: >150 feet;with supervision;with rolling walker PT Goal: Ambulate - Progress: Goal set today Pt will Go Up / Down Stairs: 1-2 stairs;with min assist;with rolling walker PT Goal: Up/Down Stairs - Progress: Goal set today Pt will Perform Home Exercise Program: Independently PT Goal: Perform Home Exercise Program - Progress: Goal set today  PT Evaluation Precautions/Restrictions  Precautions Precautions: Knee Required Braces or Orthoses: Yes Knee Immobilizer: Other (comment) (Used KI POD1 for knee stability in standing) Restrictions Weight Bearing Restrictions: Yes LLE Weight Bearing: Weight bearing as tolerated Prior Functioning  Home Living Lives With: Family Receives Help From: Family (Daughter will stay with pt 24 hour initially) Type of Home: House Home Layout: One level Home Access: Stairs to enter Entrance Stairs-Rails: None Entrance Stairs-Number of Steps: 2 Home Adaptive Equipment: None (this needs to be verified) Prior Function Level of Independence: Independent  with homemaking with ambulation Able to Take Stairs?: Yes Driving: Yes Cognition Cognition Arousal/Alertness: Awake/alert Overall Cognitive Status: Appears within functional limits for tasks assessed Orientation Level: Oriented  X4 Sensation/Coordination Sensation Light Touch: Appears Intact Coordination Gross Motor Movements are Fluid and Coordinated:  (Somewhat limited by pain and body habitus) Fine Motor Movements are Fluid and Coordinated: Yes Extremity Assessment RUE Assessment RUE Assessment: Within Functional Limits LUE Assessment LUE Assessment: Within Functional Limits RLE Assessment RLE Assessment: Within Functional Limits LLE Assessment LLE Assessment:  (Grossly decr AROM and strength, limited by pain postop; AAROM in flexion to approx 50deg by visual estimate) Mobility (including Balance) Bed Mobility Bed Mobility: Yes Supine to Sit: 4: Min assist;With rails;HOB elevated (Comment degrees) Supine to Sit Details (indicate cue type and reason): cues for technique, to pre-position by half-bridging to EOB; needed min assist to pull trunk and shoulders off of bed (handheld assist Sitting - Scoot to Edge of Bed: 4: Min assist;With rail Sitting - Scoot to [place] of Bed Details (indicate cue type and reason): cues for optimal positioning Transfers Transfers: Yes Sit to Stand: 4: Min assist;From bed;With upper extremity assist Sit to Stand Details (indicate cue type and reason): cues for technique, safety, and hand placement Stand to Sit: 3: Mod assist;With armrests;With upper extremity assist;To chair/3-in-1 Stand to Sit Details: cues and physical assist for control of descent Ambulation/Gait Ambulation/Gait: Yes Ambulation/Gait Assistance: 3: Mod assist Ambulation/Gait Assistance Details (indicate cue type and reason): cues for gait sequence, to activate quad in Left stance, and to self-monitor for dizziness; pt became dizzy with standing/uprigth actvity, and had to sit down quickly; VVS once seated in chair and reclined; RN notified Ambulation Distance (Feet): 3 Feet Assistive device: Rolling walker Gait Pattern:  (short steps)    Exercise  Total Joint Exercises Ankle Circles/Pumps: AROM;Both;10  reps;Supine Quad Sets: AROM;Left;10 reps;Supine Heel Slides: AAROM;Left;5 reps;Supine End of Session PT - End of Session Equipment Utilized During Treatment: Gait belt;Left knee immobilizer Activity Tolerance:  (limited by dizziness) Patient left: in chair;with call bell in reach Nurse Communication: Mobility status for transfers General Behavior During Session: [facility] for tasks performed Cognition: [facility] for tasks performed  [facility], [place] [phone]  03/25/2011, 4:52 PM

## 2011-03-26 LAB — CBC
HCT: 27.3 % — ABNORMAL LOW (ref 36.0–46.0)
Hemoglobin: 9.4 g/dL — ABNORMAL LOW (ref 12.0–15.0)
MCH: 31 pg (ref 26.0–34.0)
MCHC: 34.4 g/dL (ref 30.0–36.0)
MCV: 90.1 fL (ref 78.0–100.0)
Platelets: 205 10*3/uL (ref 150–400)
RBC: 3.03 MIL/uL — ABNORMAL LOW (ref 3.87–5.11)
RDW: 13 % (ref 11.5–15.5)
WBC: 8.6 10*3/uL (ref 4.0–10.5)

## 2011-03-26 LAB — PROTIME-INR
INR: 2.15 — ABNORMAL HIGH (ref 0.00–1.49)
Prothrombin Time: 24.4 seconds — ABNORMAL HIGH (ref 11.6–15.2)

## 2011-03-26 NOTE — Progress Notes (Signed)
PT. TREATMENT NOTE  03/26/11 1020  PT Visit Information  Last PT Received On 03/26/11  Precautions  Precautions Knee  Required Braces or Orthoses No  Knee Immobilizer (order for DC in chart)  Restrictions  Weight Bearing Restrictions Yes  LLE Weight Bearing WBAT  Bed Mobility  Bed Mobility No  Transfers  Transfers Yes  Sit to Stand 4: Min assist;From bed;With upper extremity assist  Sit to Stand Details (indicate cue type and reason) cues for safety and technique  Stand to Sit 4: Min assist;With upper extremity assist;With armrests;To chair/3-in-1  Stand to Sit Details vc's for safety and technique  Ambulation/Gait  Ambulation/Gait Yes  Ambulation/Gait Assistance 4: Min assist  Ambulation/Gait Assistance Details (indicate cue type and reason) cues for sequence and technique  Ambulation Distance (Feet) 50 Feet  Assistive device Rolling walker  Gait Pattern Antalgic  Exercises  Exercises Total Joint  Total Joint Exercises  Ankle Circles/Pumps AROM;Both;10 reps;Supine  Quad Sets AROM;Left;10 reps;Supine  Short Arc Quad Left;Supine;5 reps;AAROM  Knee Flexion AAROM;5 reps;Seated;Other (comment) (AA ROM 0-75 degrees)  PT - End of Session  Equipment Utilized During Treatment Gait belt;Left knee immobilizer  Activity Tolerance Patient tolerated treatment well  Patient left in chair;with call bell in reach;with family/visitor present  Nurse Communication Mobility status for transfers;Mobility status for ambulation  General  Behavior During Session [facility] for tasks performed  Cognition [facility] for tasks performed  PT - Assessment/Plan  Comments on Treatment Session Pt. progressed ambulation much better today.  Improvement with sequence noted as well.  PT Frequency 7X/week  Recommendations for Other Services OT consult  Follow Up Recommendations Home health PT;Supervision/Assistance - 24 hour  Equipment Recommended Rolling walker with 5" wheels;3 in 1 bedside comode  Acute Rehab PT  Goals  PT Goal: Sit to Stand - Progress Progressing toward goal  PT Goal: Stand to Sit - Progress Progressing toward goal  PT Goal: Ambulate - Progress Progressing toward goal  PT Goal: Perform Home Exercise Program - Progress Progressing toward goal   Weldon Picking PT Acute Rehab Services [phone] [place] [phone]

## 2011-03-26 NOTE — Progress Notes (Signed)
CARE MANAGEMENT NOTE 03/26/2011      Action/Plan:   Discharge planning. Spoke with patient. Preoperatively setup with [facility], no changes.   Anticipated DC Date:  03/27/2011   Anticipated DC Plan:  HOME W HOME HEALTH SERVICES      DC Planning Services  CM consult      [facility] Choice  HOME HEALTH   Choice offered to / List presented to:          [facility] arranged  HH-1 RN  HH-2 PT      [facility] agency  [facility] Care   Status of service:  In process, will continue to follow

## 2011-03-26 NOTE — Progress Notes (Signed)
Physical Therapy Treatment Patient Details Name: Sydney Booth MRN: [id number] DOB: 1931/11/16 Today's Date: 03/26/2011  PT Assessment/Plan   PT Goals     PT Treatment Precautions/Restrictions  Precautions Precautions: Knee Required Braces or Orthoses: No Knee Immobilizer:  (order for DC in chart) Restrictions Weight Bearing Restrictions: Yes LLE Weight Bearing: Weight bearing as tolerated Mobility (including Balance) Bed Mobility Bed Mobility: Yes Supine to Sit: 4: Min assist;HOB elevated (Comment degrees) Transfers Transfers: Yes Sit to Stand: 5: Supervision Ambulation/Gait Ambulation/Gait: Yes Ambulation/Gait Assistance: 4: Min assist Ambulation/Gait Assistance Details (indicate cue type and reason): Cues for sequence and technique Assistive device: Rolling walker Gait Pattern: Step-to pattern    Exercise  Total Joint Exercises Quad Sets: AROM;Left;20 reps;Supine Hip ABduction/ADduction: AAROM;20 reps;Supine Straight Leg Raises: AAROM;Left;20 reps;Supine Long Arc Quad: AAROM;Left;20 reps;Seated End of Session PT - End of Session Equipment Utilized During Treatment: Gait belt Activity Tolerance: Patient tolerated treatment well Patient left: in chair;with call bell in reach [place], [place] [phone] 03/26/2011, 4:47 PM

## 2011-03-26 NOTE — Progress Notes (Signed)
ANTICOAGULATION CONSULT NOTE - Initial Consult  Pharmacy Consult for Coumadin Indication: VTE prophylaxis s/p L TKA  Allergies  Allergen Reactions  . Sulfa Antibiotics Hives  . Food     Milk  Dariey products    Patient Measurements: Height: 5\' 1"  (154.9 cm) Weight: 221 lb 1.9 oz (100.3 kg) IBW/kg (Calculated) : 47.8   Vital Signs: Temp: 98.6 F (37 C) (03/21 0529) BP: 141/64 mmHg (03/21 1033) Pulse Rate: 98  (03/21 1033)  Labs:  Basename 03/26/11 0650 03/25/11 0615  HGB 9.4* 10.9*  HCT 27.3* 31.8*  PLT 205 245  APTT -- --  LABPROT 24.4* 15.4*  INR 2.15* 1.19  HEPARINUNFRC -- --  CREATININE -- 0.69  CKTOTAL -- --  CKMB -- --  TROPONINI -- --   Estimated Creatinine Clearance: 64 ml/min (by C-G formula based on Cr of 0.69).   Assessment: INR therapeutic with large increase over past 24 hours - past only 2 doses of coumadin. Pt continues on lovenox 30mg  SQ q12h. No bleeding noted but Hgb trending down.  Goal of Therapy:  INR 2-3   Plan:  1. No coumadin today 2. Will d/c lovenox as original order states to d/c once INR > 1.8. 3. Continue daily INR  Christoper Fabian, PharmD, BCPS Clinical pharmacist, pager [phone] 03/26/2011,11:30 AM

## 2011-03-26 NOTE — Evaluation (Signed)
Occupational Therapy Evaluation Patient Details Name: Sydney Booth MRN: [id number] DOB: 04/04/1931 Today's Date: 03/26/2011  Problem List:  Patient Active Problem List  Diagnoses  . Left knee DJD  . HTN (hypertension)    Past Medical History:  Past Medical History  Diagnosis Date  . Hypertension   . Blood transfusion   . Hyperlipemia   . Headache   . Closed head injury     as a chlid  . Cervical vertebral fracture     fued on its on  . Shortness of breath     With activity  . Seasonal allergies   . GERD (gastroesophageal reflux disease)   . Arthritis   . H/O hiatal hernia   . Ulcer 1991    Bledding   . Rotator cuff tear     Left  . Mitral valve prolapse    Past Surgical History:  Past Surgical History  Procedure Date  . Carparal tunnel   . Thumb fusion   . Eye surgery     Right 2011, Left 2008  . Colonscopy   . Biospy    . Biospy      Uterus wall  . Tubal ligation   . Total knee arthroplasty 03/24/2011    Procedure: TOTAL KNEE ARTHROPLASTY;  Surgeon: Velna Ochs, MD;  Location: [facility] OR;  Service: Orthopedics;  Laterality: Left;  with revision tibial component    OT Assessment/Plan/Recommendation OT Assessment Clinical Impression Statement: This 78 y.o. female admitted for Lt. TKA. Pt. very motivated.  She currently is performing simple ADLs at min guard assist to supervision level, and should quickly progress to modified independence.  Pt. has good support upon discharge.  All education completed.  No further OT needs identified OT Recommendation/Assessment: Patient does not need any further OT services OT Recommendation Follow Up Recommendations: No OT follow up Equipment Recommended: Rolling walker with 5" wheels;3 in 1 bedside comode OT Goals    OT Evaluation Precautions/Restrictions  Precautions Precautions: Knee Required Braces or Orthoses: No Knee Immobilizer:  (order for DC in chart) Restrictions Weight Bearing Restrictions: Yes LLE Weight  Bearing: Weight bearing as tolerated Prior Functioning Home Living Lives With: Family Receives Help From: Family Type of Home: House Home Layout: One level Home Access: Stairs to enter Entrance Stairs-Rails: None Entrance Stairs-Number of Steps: 2 Bathroom Shower/Tub: Walk-in shower (Pt. will be on first floor with 1/2 bath) Bathroom Toilet: Standard Bathroom Accessibility: Yes How Accessible: Accessible via walker Home Adaptive Equipment: None Additional Comments: Pt's dtr. will be available to assist pt. 24/7 at discharge Prior Function Level of Independence: Independent with homemaking with ambulation Able to Take Stairs?: Yes Driving: Yes ADL ADL Eating/Feeding: Simulated;Independent Where Assessed - Eating/Feeding: Chair Grooming: Simulated;Wash/dry hands;Wash/dry face;Teeth care (min guard assist) Where Assessed - Grooming: Standing at sink Upper Body Bathing: Simulated;Set up Where Assessed - Upper Body Bathing: Sitting, chair Lower Body Bathing: Simulated (min guard assist for sit to stand) Where Assessed - Lower Body Bathing: Sit to stand from chair;Sit to stand from bed Upper Body Dressing: Simulated;Set up Where Assessed - Upper Body Dressing: Sitting, chair Lower Body Dressing: Simulated;Performed (min guard assist) Where Assessed - Lower Body Dressing: Sit to stand from chair;Sit to stand from bed Toilet Transfer: Simulated (min guard assist) Toilet Transfer Method: Ambulating Toilet Transfer Equipment: Comfort height toilet;Grab bars Toileting - Clothing Manipulation: Simulated (min guard assist) Where Assessed - Toileting Clothing Manipulation: Standing Toileting - Hygiene: Simulated;Supervision/safety Where Assessed - Toileting Hygiene: Sit on 3-in-1 or  toilet Equipment Used: Rolling walker ADL Comments: Pt. motivated.  Able to don/doff socks.  Ambulated to BR with PT just prior to OT session.  Pt. reports previous rotator cuff injury Lt. shoulder.  Reviewed  her exercise program that she had been previously performing, and encouraged her to continue with good alignment of shoulder Vision/Perception    Cognition Cognition Arousal/Alertness: Awake/alert Overall Cognitive Status: Appears within functional limits for tasks assessed Orientation Level: Oriented X4 Sensation/Coordination Coordination Gross Motor Movements are Fluid and Coordinated: Yes Fine Motor Movements are Fluid and Coordinated: Yes Extremity Assessment RUE Assessment RUE Assessment: Within Functional Limits LUE Assessment LUE Assessment: Exceptions to WFL LUE AROM (degrees) LUE Overall AROM Comments: Pt. reports rotator cuff injury and decreased strength End of Session OT - End of Session Activity Tolerance: Patient tolerated treatment well Patient left: in chair;with call bell in reach General Behavior During Session: [facility] for tasks performed Cognition: [facility] for tasks performed   Sayra Frisby M 03/26/2011, 4:50 PM

## 2011-03-26 NOTE — Progress Notes (Signed)
Subjective: 2 Days Post-Op Procedure(s) (LRB): TOTAL KNEE ARTHROPLASTY (Left)  Activity level:  Walking in hall Diet tolerance:  Eating  Voiding:  ok Patient reports pain as 2 on 0-10 scale.    Objective: Vital signs in last 24 hours: Temp:  [97.9 F (36.6 C)-98.6 F (37 C)] 98.6 F (37 C) (03/21 0529) Pulse Rate:  [85-98] 98  (03/21 1033) Resp:  [17-20] 20  (03/21 0529) BP: (98-141)/(53-75) 141/64 mmHg (03/21 1033) SpO2:  [96 %-97 %] 96 % (03/21 0529)  Intake/Output from previous day: 03/20 0701 - 03/21 0700 In: 1190 [P.O.:590; I.V.:600] Out: 525 [Urine:450; Drains:75] Intake/Output this shift: Total I/O In: 290 [P.O.:290] Out: -    Basename 03/26/11 0650 03/25/11 0615  HGB 9.4* 10.9*    Basename 03/26/11 0650 03/25/11 0615  WBC 8.6 10.0  RBC 3.03* 3.55*  HCT 27.3* 31.8*  PLT 205 245    Basename 03/25/11 0615  NA 133*  K 3.7  CL 98  CO2 28  BUN 12  CREATININE 0.69  GLUCOSE 129*  CALCIUM 8.7    Basename 03/26/11 0650 03/25/11 0615  LABPT -- --  INR 2.15* 1.19    Physical Exam:  Neurologically intact ABD soft Neurovascular intact Sensation intact distally Intact pulses distally Dorsiflexion/Plantar flexion intact Incision: dressing C/D/I No cellulitis present Compartment soft Drain pulled and dressing changed  Assessment/Plan: 2 Days Post-Op Procedure(s) (LRB): TOTAL KNEE ARTHROPLASTY (Left) D/c IV  Plan to D/C home Friday [facility] for therapy and blood draws x 2 weeks  Amun Stemm R 03/26/2011, 12:51 PM

## 2011-03-27 LAB — CBC
HCT: 28 % — ABNORMAL LOW (ref 36.0–46.0)
Hemoglobin: 9.5 g/dL — ABNORMAL LOW (ref 12.0–15.0)
MCH: 31.4 pg (ref 26.0–34.0)
MCHC: 33.9 g/dL (ref 30.0–36.0)
MCV: 92.4 fL (ref 78.0–100.0)
Platelets: 211 10*3/uL (ref 150–400)
RBC: 3.03 MIL/uL — ABNORMAL LOW (ref 3.87–5.11)
RDW: 13.4 % (ref 11.5–15.5)
WBC: 7.8 10*3/uL (ref 4.0–10.5)

## 2011-03-27 LAB — PROTIME-INR
INR: 1.88 — ABNORMAL HIGH (ref 0.00–1.49)
Prothrombin Time: 21.9 seconds — ABNORMAL HIGH (ref 11.6–15.2)

## 2011-03-27 MED ORDER — WARFARIN SODIUM 2 MG PO TABS
2.0000 mg | ORAL_TABLET | Freq: Once | ORAL | Status: AC
  Administered 2011-03-27: 2 mg via ORAL
  Filled 2011-03-27: qty 1

## 2011-03-27 MED ORDER — WARFARIN SODIUM 2 MG PO TABS: ORAL_TABLET | ORAL | Status: DC

## 2011-03-27 MED ORDER — METHOCARBAMOL 500 MG PO TABS: 500.0000 mg | ORAL_TABLET | Freq: Four times a day (QID) | ORAL | Status: AC | PRN

## 2011-03-27 MED ORDER — HYDROCODONE-ACETAMINOPHEN 5-325 MG PO TABS: 1.0000 | ORAL_TABLET | ORAL | Status: AC | PRN

## 2011-03-27 MED ORDER — BISACODYL 10 MG RE SUPP
10.0000 mg | Freq: Every day | RECTAL | Status: DC | PRN
  Administered 2011-03-27: 10 mg via RECTAL

## 2011-03-27 MED ORDER — FLEET ENEMA 7-19 GM/118ML RE ENEM
1.0000 | ENEMA | Freq: Every day | RECTAL | Status: DC | PRN
  Administered 2011-03-27: 1 via RECTAL

## 2011-03-27 MED ORDER — MAGNESIUM CITRATE PO SOLN
1.0000 | Freq: Once | ORAL | Status: AC
  Administered 2011-03-27: 1 via ORAL
  Filled 2011-03-27: qty 296

## 2011-03-27 NOTE — Progress Notes (Signed)
ANTICOAGULATION CONSULT NOTE - Initial Consult  Pharmacy Consult for Coumadin Indication: VTE prophylaxis s/p L TKA  Allergies  Allergen Reactions  . Sulfa Antibiotics Hives  . Food     Milk  Dariey products    Patient Measurements: Height: 5\' 1"  (154.9 cm) Weight: 221 lb 1.9 oz (100.3 kg) IBW/kg (Calculated) : 47.8   Vital Signs: Temp: 98.6 F (37 C) (03/22 0527) BP: 133/42 mmHg (03/22 0527) Pulse Rate: 86  (03/22 0527)  Labs:  Basename 03/27/11 0703 03/26/11 0650 03/25/11 0615  HGB 9.5* 9.4* --  HCT 28.0* 27.3* 31.8*  PLT 211 205 245  APTT -- -- --  LABPROT 21.9* 24.4* 15.4*  INR 1.88* 2.15* 1.19  HEPARINUNFRC -- -- --  CREATININE -- -- 0.69  CKTOTAL -- -- --  CKMB -- -- --  TROPONINI -- -- --   Estimated Creatinine Clearance: 64 ml/min (by C-G formula based on Cr of 0.69).   Assessment: INR decreased after holding dose yesterday.  No bleeding noted.  Goal of Therapy:  INR 2-3   Plan:  1. Coumadin 2 mg today 2. Continue daily INR  Talbert Cage, PharmD Clinical pharmacist, pager [phone] 03/27/2011,9:52 AM

## 2011-03-27 NOTE — Discharge Summary (Signed)
Patient ID: Sydney Booth MRN: [id number] DOB/AGE: [birth year] 78 y.o.  Admit date: 03/24/2011 Discharge date: 03/27/2011  Admission Diagnoses:  Principal Problem:  *Left knee DJD Active Problems:  HTN (hypertension)   Discharge Diagnoses:  Same  Past Medical History  Diagnosis Date  . Hypertension   . Blood transfusion   . Hyperlipemia   . Headache   . Closed head injury     as a chlid  . Cervical vertebral fracture     fued on its on  . Shortness of breath     With activity  . Seasonal allergies   . GERD (gastroesophageal reflux disease)   . Arthritis   . H/O hiatal hernia   . Ulcer 1991    Bledding   . Rotator cuff tear     Left  . Mitral valve prolapse     Surgeries: Procedure(s): TOTAL KNEE ARTHROPLASTY on 03/24/2011   Consultants:    Discharged Condition: Improved  Hospital Course: Sydney Booth is an 78 y.o. female who was admitted 03/24/2011 for operative treatment ofLeft knee DJD. Patient has severe unremitting pain that affects sleep, daily activities, and work/hobbies. After pre-op clearance the patient was taken to the operating room on 03/24/2011 and underwent  Procedure(s): TOTAL KNEE ARTHROPLASTY.    Patient was given perioperative antibiotics: Anti-infectives     Start     Dose/Rate Route Frequency Ordered Stop   03/24/11 1930   ceFAZolin (ANCEF) IVPB 2 g/50 mL premix        2 g 100 mL/hr over 30 Minutes Intravenous Every 6 hours 03/24/11 1749 03/25/11 0740   03/24/11 0919   cefUROXime (ZINACEF) injection  Status:  Discontinued          As needed 03/24/11 0919 03/24/11 1107   03/23/11 1413   ceFAZolin (ANCEF) IVPB 2 g/50 mL premix        2 g 100 mL/hr over 30 Minutes Intravenous 60 min pre-op 03/23/11 1413 03/24/11 0845           Patient was given sequential compression devices, early ambulation, and chemoprophylaxis to prevent DVT.  Patient benefited maximally from hospital stay and there were no complications.    Recent vital signs:  Patient Vitals for the past 24 hrs:  BP Temp Pulse Resp SpO2  03/27/11 0954 113/50 mmHg - 83  - -  03/27/11 0527 133/42 mmHg 98.6 F (37 C) 86  18  95 %  2011-04-14 2235 110/43 mmHg 98.8 F (37.1 C) 86  18  98 %  2011/04/14 1300 110/70 mmHg 98.6 F (37 C) 105  17  100 %  2011-04-14 1033 141/64 mmHg - 98  - -     Recent laboratory studies:  Basename 03/27/11 0703 04/14/11 0650 03/25/11 0615  WBC 7.8 8.6 --  HGB 9.5* 9.4* --  HCT 28.0* 27.3* --  PLT 211 205 --  NA -- -- 133*  K -- -- 3.7  CL -- -- 98  CO2 -- -- 28  BUN -- -- 12  CREATININE -- -- 0.69  GLUCOSE -- -- 129*  INR 1.88* 2.15* --  CALCIUM -- -- 8.7     Discharge Medications:   Medication List  As of 03/27/2011 10:09 AM   STOP taking these medications         aspirin EC 81 MG EC tablet      CINNAMON PO      fish oil-omega-3 fatty acids 1000 MG capsule  TAKE these medications         amLODipine 5 MG tablet   Commonly known as: NORVASC   Take 5 mg by mouth daily.      calcium carbonate 600 MG Tabs   Commonly known as: OS-CAL   Take 600 mg by mouth daily.      ezetimibe 10 MG tablet   Commonly known as: ZETIA   Take 10 mg by mouth daily.      HYDROcodone-acetaminophen 5-325 MG per tablet   Commonly known as: NORCO   Take 1-2 tablets by mouth every 4 (four) hours as needed.      medroxyPROGESTERone 2.5 MG tablet   Commonly known as: PROVERA   Take 2.5 mg by mouth daily.      methocarbamol 500 MG tablet   Commonly known as: ROBAXIN   Take 1 tablet (500 mg total) by mouth every 6 (six) hours as needed.      omeprazole 40 MG capsule   Commonly known as: PRILOSEC   Take 40 mg by mouth daily.      propranolol 60 MG 24 hr capsule   Commonly known as: INDERAL LA   Take 60 mg by mouth daily.      VITAMIN C PO   Take 1 tablet by mouth daily.      VITAMIN D (CHOLECALCIFEROL) PO   Take 1 tablet by mouth daily.      warfarin 2 MG tablet   Commonly known as: COUMADIN   Take as directed for 2  weeks -INR 2.0- 3.0            Diagnostic Studies: Dg Chest 2 View  03/13/2011  *RADIOLOGY REPORT*  Clinical Data: Preoperative study for left total knee arthroplasty.  CHEST - 2 VIEW  Comparison: No priors.  Findings: There is a vague nodular opacity projecting in the left mid lung (coincident with the anterior aspect of the left third rib and the posterior aspect of the left eighth rib), seen only on the frontal projection.  No other larger more suspicious appearing pulmonary nodules or masses are otherwise identified.  No consolidative airspace disease.  No pleural effusions.  Pulmonary vasculature and the cardiomediastinal silhouette are within normal limits.  IMPRESSION: 1.  No radiographic evidence of acute cardiopulmonary disease. 2.  Vague nodular opacity projecting in the left mid lung seen only on the frontal projection, as above.  Follow-up radiographs in 1-2 months are recommended to evaluate for persistence or resolution of this finding.  Original Report Authenticated By: Florencia Reasons, M.D.    Disposition: Final discharge disposition not confirmed  Discharge Orders    Future Orders Please Complete By Expires   Diet - low sodium heart healthy      Call MD / Call 911      Comments:   If you experience chest pain or shortness of breath, CALL 911 and be transported to the hospital emergency room.  If you develope a fever above 101 F, pus (white drainage) or increased drainage or redness at the wound, or calf pain, call your surgeon's office.   Constipation Prevention      Comments:   Drink plenty of fluids.  Prune juice may be helpful.  You may use a stool softener, such as Colace (over the counter) 100 mg twice a day.  Use MiraLax (over the counter) for constipation as needed.   Increase activity slowly as tolerated      Weight Bearing as taught in Physical Therapy  Comments:   Use a walker or crutches as instructed.      Follow-up Information    Follow up with  DALLDORF,PETER G, MD. Call in 12 days.   Contact information:   [address] [place] [address] [phone]           Signed: Prince Rome 03/27/2011, 10:09 AM

## 2011-03-27 NOTE — Progress Notes (Signed)
Subjective: 3 Days Post-Op Procedure(s) (LRB): TOTAL KNEE ARTHROPLASTY (Left)  Activity level:  walking Diet tolerance:  eating Voiding:  ok Patient reports pain as 1 on 0-10 scale.    Objective: Vital signs in last 24 hours: Temp:  [98.6 F (37 C)-98.8 F (37.1 C)] 98.6 F (37 C) (03/22 0527) Pulse Rate:  [83-105] 83  (03/22 0954) Resp:  [17-18] 18  (03/22 0527) BP: (110-141)/(42-70) 113/50 mmHg (03/22 0954) SpO2:  [95 %-100 %] 95 % (03/22 0527)  Intake/Output from previous day: 03/21 0701 - 03/22 0700 In: 898 [P.O.:898] Out: -  Intake/Output this shift:     Basename 03/27/11 0703 03/26/11 0650 03/25/11 0615  HGB 9.5* 9.4* 10.9*    Basename 03/27/11 0703 03/26/11 0650  WBC 7.8 8.6  RBC 3.03* 3.03*  HCT 28.0* 27.3*  PLT 211 205    Basename 03/25/11 0615  NA 133*  K 3.7  CL 98  CO2 28  BUN 12  CREATININE 0.69  GLUCOSE 129*  CALCIUM 8.7    Basename 03/27/11 0703 03/26/11 0650  LABPT -- --  INR 1.88* 2.15*    Physical Exam:  Neurologically intact ABD soft Neurovascular intact Sensation intact distally Intact pulses distally Dorsiflexion/Plantar flexion intact No cellulitis present Compartment soft  Assessment/Plan: 3 Days Post-Op Procedure(s) (LRB): TOTAL KNEE ARTHROPLASTY (Left) Advance diet Up with therapy Discharge home with home health  Anjenette Gerbino R 03/27/2011, 10:01 AM

## 2011-03-27 NOTE — Progress Notes (Signed)
PT TREATMENT NOTE  03/27/11 1018  PT Visit Information  Last PT Received On 03/27/11  Precautions  Precautions Knee  Required Braces or Orthoses No  Restrictions  Weight Bearing Restrictions Yes  LLE Weight Bearing WBAT  Bed Mobility  Bed Mobility No  Transfers  Transfers Yes  Sit to Stand 6: Modified independent (Device/Increase time);From chair/3-in-1;With upper extremity assist;With armrests  Sit to Stand Details (indicate cue type and reason) pt. used correct technique and showed good safety  Stand to Sit 6: Modified independent (Device/Increase time);With armrests;To chair/3-in-1  Stand to Sit Details correct technique and sequence  Ambulation/Gait  Ambulation/Gait Yes  Ambulation/Gait Assistance 6: Modified independent (Device/Increase time)  Ambulation/Gait Assistance Details (indicate cue type and reason) pt. able to replicate sequence  without cues today  Ambulation Distance (Feet) 160 Feet  Assistive device Rolling walker  Gait Pattern Step-to pattern  Stairs Yes  Stairs Assistance 5: Supervision  Stairs Assistance Details (indicate cue type and reason) pt. could state sequence before completing task  Stair Management Technique No rails;Backwards;With walker  Number of Stairs 1   Exercises  Exercises Total Joint  Total Joint Exercises  Ankle Circles/Pumps AROM;Both;10 reps;Supine  Quad Sets AROM;Left;20 reps;Supine  Short Arc Quad Left;10 reps;AAROM;Supine  Straight Leg Raises AAROM;Left;5 reps;Supine  Knee Flexion AAROM;5 reps;Seated;Other (comment) (AAROM 0-80 today)  PT - End of Session  Equipment Utilized During Treatment Gait belt  Activity Tolerance Patient tolerated treatment well  Patient left in chair;with call bell in reach  Nurse Communication Mobility status for transfers;Mobility status for ambulation  General  Behavior During Session [facility] for tasks performed  Cognition [facility] for tasks performed  PT - Assessment/Plan  Comments on Treatment Session  Continues to show good progress and will be ready for DC home with daughter/24 hr. care after next session.  PT Plan Discharge plan remains appropriate  PT Frequency 7X/week  Recommendations for Other Services OT consult  Follow Up Recommendations Home health PT;Supervision/Assistance - 24 hour  Equipment Recommended Rolling walker with 5" wheels;3 in 1 bedside comode  Acute Rehab PT Goals  PT Goal: Sit to Stand - Progress Progressing toward goal  PT Goal: Stand to Sit - Progress Progressing toward goal  PT Goal: Ambulate - Progress Progressing toward goal  PT Goal: Up/Down Stairs - Progress Progressing toward goal  PT Goal: Perform Home Exercise Program - Progress Progressing toward goal   Weldon Picking PT Acute Rehab Services [phone] Beeper [phone]

## 2011-03-27 NOTE — Discharge Instructions (Signed)
Ice and elevation  Use cpm at home RTO 10-12 days Call office if any problems

## 2011-03-27 NOTE — Progress Notes (Signed)
D/C instructions reviewed with patient and daughter. RX x 3 given. Has equipment at home. hh services arranged with [facility]. All questions answered. Coumadin dose given prior to d/c. Pt d/c'ed via wheelchair in stable condition

## 2011-03-27 NOTE — Progress Notes (Signed)
PT TREATMENT NOTE  03/27/11 1340  PT Visit Information  Last PT Received On 03/27/11  Precautions  Precautions Knee  Required Braces or Orthoses No  Restrictions  Weight Bearing Restrictions Yes  LLE Weight Bearing WBAT  Bed Mobility  Bed Mobility No  Transfers  Transfers Yes  Sit to Stand 6: Modified independent (Device/Increase time);From chair/3-in-1  Sit to Stand Details (indicate cue type and reason) no cues needed  Stand to Sit 6: Modified independent (Device/Increase time);To chair/3-in-1;With upper extremity assist;With armrests  Stand to Sit Details no cues needed  Ambulation/Gait  Ambulation/Gait Yes  Ambulation/Gait Assistance 6: Modified independent (Device/Increase time)  Ambulation/Gait Assistance Details (indicate cue type and reason) good sequence and technique  Ambulation Distance (Feet) 80 Feet  Assistive device Rolling walker  Gait Pattern Step-to pattern  Stairs Yes  Stairs Assistance 6: Modified independent (Device/Increase time)  Stairs Assistance Details (indicate cue type and reason) no cues needed for technique  Stair Management Technique No rails;Backwards;With walker  Number of Stairs 1   PT - End of Session  Equipment Utilized During Treatment Gait belt  Activity Tolerance Patient tolerated treatment well  Patient left in chair;with call bell in reach  Nurse Communication Mobility status for transfers;Mobility status for ambulation  General  Behavior During Session [facility] for tasks performed  Cognition [facility] for tasks performed  PT - Assessment/Plan  Comments on Treatment Session Good progress, ready for DC when MD ready.  PT Plan Discharge plan remains appropriate  Follow Up Recommendations Home health PT;Supervision/Assistance - 24 hour  Equipment Recommended Rolling walker with 5" wheels;3 in 1 bedside comode  Acute Rehab PT Goals  PT Goal: Sit to Stand - Progress Met  PT Goal: Stand to Sit - Progress Met  PT Goal: Ambulate - Progress Met  PT  Goal: Up/Down Stairs - Progress Met  PT Goal: Perform Home Exercise Program - Progress Progressing toward goal   Weldon Picking PT Acute Rehab Services [phone] Beeper [phone]

## 2011-03-28 LAB — TYPE AND SCREEN
ABO/RH(D): O POS
Antibody Screen: POSITIVE
DAT, IgG: NEGATIVE
Donor AG Type: NEGATIVE
Donor AG Type: NEGATIVE
PT AG Type: NEGATIVE
Unit division: 0
Unit division: 0

## 2012-01-27 DIAGNOSIS — E669 Obesity, unspecified: Secondary | ICD-10-CM | POA: Insufficient documentation

## 2012-03-02 DIAGNOSIS — K219 Gastro-esophageal reflux disease without esophagitis: Secondary | ICD-10-CM | POA: Insufficient documentation

## 2012-03-02 DIAGNOSIS — E785 Hyperlipidemia, unspecified: Secondary | ICD-10-CM | POA: Insufficient documentation

## 2012-03-02 DIAGNOSIS — I341 Nonrheumatic mitral (valve) prolapse: Secondary | ICD-10-CM | POA: Insufficient documentation

## 2012-03-24 DIAGNOSIS — M1710 Unilateral primary osteoarthritis, unspecified knee: Secondary | ICD-10-CM | POA: Insufficient documentation

## 2012-03-24 DIAGNOSIS — Z96659 Presence of unspecified artificial knee joint: Secondary | ICD-10-CM | POA: Insufficient documentation

## 2013-02-09 ENCOUNTER — Encounter: Payer: Self-pay | Admitting: Cardiology

## 2013-02-09 DIAGNOSIS — E785 Hyperlipidemia, unspecified: Secondary | ICD-10-CM | POA: Insufficient documentation

## 2013-02-09 DIAGNOSIS — E782 Mixed hyperlipidemia: Secondary | ICD-10-CM | POA: Insufficient documentation

## 2013-02-14 DIAGNOSIS — G4733 Obstructive sleep apnea (adult) (pediatric): Secondary | ICD-10-CM | POA: Insufficient documentation

## 2013-03-08 DIAGNOSIS — E559 Vitamin D deficiency, unspecified: Secondary | ICD-10-CM | POA: Insufficient documentation

## 2013-11-24 ENCOUNTER — Encounter: Payer: Self-pay | Admitting: Neurology

## 2013-11-24 ENCOUNTER — Ambulatory Visit (INDEPENDENT_AMBULATORY_CARE_PROVIDER_SITE_OTHER): Payer: Medicare Other | Admitting: Neurology

## 2013-11-24 VITALS — BP 136/75 | HR 70 | Ht 60.5 in | Wt 240.0 lb

## 2013-11-24 DIAGNOSIS — R42 Dizziness and giddiness: Secondary | ICD-10-CM

## 2013-11-24 DIAGNOSIS — H538 Other visual disturbances: Secondary | ICD-10-CM | POA: Insufficient documentation

## 2013-11-24 NOTE — Patient Instructions (Signed)
Blurred Vision °You have been seen today complaining of blurred vision. This means you have a loss of ability to see small details.  °CAUSES  °Blurred vision can be a symptom of underlying eye problems, such as: °· Aging of the eye (presbyopia). °· Glaucoma. °· Cataracts. °· Eye infection. °· Eye-related migraine. °· Diabetes mellitus. °· Fatigue. °· Migraine headaches. °· High blood pressure. °· Breakdown of the back of the eye (macular degeneration). °· Problems caused by some medications. °The most common cause of blurred vision is the need for eyeglasses or a new prescription. Today in the emergency department, no cause for your blurred vision can be found. °SYMPTOMS  °Blurred vision is the loss of visual sharpness and detail (acuity). °DIAGNOSIS  °Should blurred vision continue, you should see your caregiver. If your caregiver is your primary care physician, he or she may choose to refer you to another specialist.  °TREATMENT  °Do not ignore your blurred vision. Make sure to have it checked out to see if further treatment or referral is necessary. °SEEK MEDICAL CARE IF:  °You are unable to get into a specialist so we can help you with a referral. °SEEK IMMEDIATE MEDICAL CARE IF: °You have severe eye pain, severe headache, or sudden loss of vision. °MAKE SURE YOU:  °· Understand these instructions. °· Will watch your condition. °· Will get help right away if you are not doing well or get worse. °Document Released: 12/25/2002 Document Revised: 03/16/2011 Document Reviewed: 07/27/2007 °ExitCare® Patient Information ©2015 [employer]. This information is not intended to replace advice given to you by your health care provider. Make sure you discuss any questions you have with your health care provider. ° °

## 2013-11-24 NOTE — Progress Notes (Signed)
Reason for visit: Blurred vision  Sydney Booth is a 80 y.o. female  History of present illness:  Sydney Booth is an 80 year old right-handed white female with a history of dizziness in the past, she was seen through this office in 2011 with reports of dizziness. At that time, a brainstem auditory evoked response test was unremarkable, and MRI evaluation the brain showed minimal nonspecific white matter disease. MRA of the head was unremarkable. The patient one month ago, she had onset of some problems with blurred vision associated with some increase in her dizziness. She felt somewhat confused, and went to the emergency room, and was found to have a right lower lobe pneumonia. She has been treated for the pneumonia, but the blurred vision has not improved. Over the last 2 weeks, the vision has actually improved slightly. The patient has undergone MRI evaluation of the brain,and the disc is brought for my review. This shows evidence of small vessel disease. The patient is on low-dose aspirin. She indicates that she went to an optometrist, and eye examination was unremarkable. The patient indicates that the vision in the right eye is unaffected, the vision in the left eye is blurred. She denies any eye pain, she does have headaches on occasion, and she has a prior history of migraine. The patient has not had a severe worsening of her headaches, however. The patient reports no generalized symptoms of malaise, fevers or chills, or weight loss. She has had no night sweats. She reports no numbness or weakness on the face, arms, or legs with exception of some numbness in the feet that has been present for 10 years or more. The patient denies any severe changes in balance, no falls or blackouts. She is sent to this office for an evaluation.  Past Medical History  Diagnosis Date  . Hypertension   . Blood transfusion   . Hyperlipemia   . Headache(784.0)   . Closed head injury     as a chlid  . Cervical  vertebral fracture     fued on its on  . Shortness of breath     With activity  . Seasonal allergies   . GERD (gastroesophageal reflux disease)   . Arthritis   . H/O hiatal hernia   . Ulcer 1991    Bledding   . Rotator cuff tear     Left  . Mitral valve prolapse   . Obesity   . Osteopenia   . Gastric ulcer   . OSA on CPAP     Past Surgical History  Procedure Laterality Date  . Carparal tunnel    . Thumb fusion    . Eye surgery      Right 2011, Left 2008  . Colonscopy    . Biospy     . Biospy       Uterus wall  . Tubal ligation    . Total knee arthroplasty  03/24/2011    Procedure: TOTAL KNEE ARTHROPLASTY;  Surgeon: Hessie Dibble, MD;  Location: [facility];  Service: Orthopedics;  Laterality: Left;  with revision tibial component  . Cataract extraction    . Lipoma excision      FROM BACK     Family History  Problem Relation Age of Onset  . Anesthesia problems Neg Hx   . CVA Mother   . Cancer Father     COLON, LIVER AND PANCREAS  . Sleep apnea Brother   . Pulmonary embolism Brother  Social history:  reports that she has never smoked. She has never used smokeless tobacco. She reports that she does not drink alcohol or use illicit drugs.  Medications:  Current Outpatient Prescriptions on File Prior to Visit  Medication Sig Dispense Refill  . Ascorbic Acid (VITAMIN C PO) Take 1 tablet by mouth daily.    Marland Kitchen aspirin 81 MG tablet Take 81 mg by mouth daily.    . calcium carbonate (OS-CAL) 600 MG TABS Take 600 mg by mouth daily.    Marland Kitchen CINNAMON PO Take 500 mg by mouth 2 (two) times daily.    . Omega-3 Fatty Acids (FISH OIL) 1000 MG CPDR Take 1,000 mg by mouth daily.    Marland Kitchen omeprazole (PRILOSEC) 40 MG capsule Take 40 mg by mouth daily.    Marland Kitchen VITAMIN D, CHOLECALCIFEROL, PO Take 1 tablet by mouth daily.     No current facility-administered medications on file prior to visit.      Allergies  Allergen Reactions  . Sulfa Antibiotics Hives  . Food     Milk  Dariey  products    ROS:  Out of a complete 14 system review of symptoms, the patient complains only of the following symptoms, and all other reviewed systems are negative.  Ringing in the ears Blurred vision Shortness of breath Joint pain Numbness, dizziness  Blood pressure 136/75, pulse 70, height 5' 0.5" (1.537 m), weight 240 lb (108.863 kg).  Physical Exam  General: The patient is alert and cooperative at the time of the examination. The patient is moderately to markedly obese.  Eyes: Pupils are equal, round, and reactive to light. Discs are flat bilaterally.  Neck: The neck is supple, no carotid bruits are noted.  Respiratory: The respiratory examination is clear.  Cardiovascular: The cardiovascular examination reveals a regular rate and rhythm, no obvious murmurs or rubs are noted.  Skin: Extremities are with 1+ edema at the ankles bilaterally.  Neurologic Exam  Mental status: The patient is alert and oriented x 3 at the time of the examination. The patient has apparent normal recent and remote memory, with an apparently normal attention span and concentration ability.  Cranial nerves: Facial symmetry is present. There is good sensation of the face to pinprick and soft touch bilaterally. The strength of the facial muscles and the muscles to head turning and shoulder shrug are normal bilaterally. Speech is well enunciated, no aphasia or dysarthria is noted. Extraocular movements are full. Visual fields are full. The tongue is midline, and the patient has symmetric elevation of the soft palate. No obvious hearing deficits are noted.  Motor: The motor testing reveals 5 over 5 strength of all 4 extremities. Good symmetric motor tone is noted throughout.  Sensory: Sensory testing is intact to pinprick, soft touch, vibration sensation, and position sense on all 4 extremities. No evidence of extinction is noted.  Coordination: Cerebellar testing reveals good finger-nose-finger and  heel-to-shin bilaterally.  Gait and station: Gait is normal. Tandem gait is slightly unsteady. Romberg is negative. No drift is seen.  Reflexes: Deep tendon reflexes are symmetric and normal bilaterally. Toes are downgoing bilaterally.   Assessment/Plan:  1. Blurred vision, OS  2. Dizziness  The patient reports some change in vision in the left eye. The clinical examination today is unremarkable. The patient will have blood work done today, and she will have a visual evoked response test. A carotid Doppler study will be done. She has seen an optometrist previously, but I have recommended that she  see an ophthalmologist to look for retinal pathology. This will be set up. The patient currently lives in [place] state, and she will be returning in 5 weeks. The patient will follow-up through this office if needed.  Jill Alexanders MD 11/25/2013 7:42 AM  [facility] [address] [place], [place] [address]  Phone [phone] Fax [phone]

## 2013-11-26 LAB — ANA W/REFLEX: Anti Nuclear Antibody(ANA): NEGATIVE

## 2013-11-26 LAB — SEDIMENTATION RATE: Sed Rate: 7 mm/hr (ref 0–40)

## 2013-11-26 LAB — ANGIOTENSIN CONVERTING ENZYME: Angio Convert Enzyme: 67 U/L (ref 14–82)

## 2013-11-26 LAB — VITAMIN B12: Vitamin B-12: 320 pg/mL (ref 211–946)

## 2013-11-28 ENCOUNTER — Ambulatory Visit (INDEPENDENT_AMBULATORY_CARE_PROVIDER_SITE_OTHER): Payer: Medicare Other | Admitting: Neurology

## 2013-11-28 ENCOUNTER — Telehealth: Payer: Self-pay | Admitting: Neurology

## 2013-11-28 DIAGNOSIS — R42 Dizziness and giddiness: Secondary | ICD-10-CM

## 2013-11-28 DIAGNOSIS — H538 Other visual disturbances: Secondary | ICD-10-CM

## 2013-11-28 NOTE — Procedures (Signed)
    History:   Sydney Booth is an 80 year old patient with a history of dizziness in the past with a MRI study that showed nonspecific white matter changes. The patient has reported some increase in her dizziness recently, and some blurring of vision involving the left eye. The patient is being evaluated for this issue.   Description: The visual evoked response test was performed today using 32 x 32 check sizes. The absolute latencies for the N1 and the P100 wave forms were within normal limits bilaterally. The amplitudes for the P100 wave forms were also within normal limits bilaterally. The visual acuity was 20/40 OD and 20/40 OS corrected.  Impression:  The visual evoked response test above was within normal limits bilaterally. No evidence of conduction slowing was seen within the anterior visual pathways on either side on today's evaluation.

## 2013-11-28 NOTE — Telephone Encounter (Signed)
I called the patient. The visual evoked response test was unremarkable. The patient will be seen by her ophthalmologist on 12/14/2013. Blood work is still pending.

## 2013-12-14 ENCOUNTER — Ambulatory Visit (INDEPENDENT_AMBULATORY_CARE_PROVIDER_SITE_OTHER): Payer: Medicare Other

## 2013-12-14 DIAGNOSIS — R42 Dizziness and giddiness: Secondary | ICD-10-CM

## 2013-12-14 DIAGNOSIS — H538 Other visual disturbances: Secondary | ICD-10-CM

## 2013-12-18 ENCOUNTER — Telehealth: Payer: Self-pay | Admitting: Neurology

## 2013-12-18 NOTE — Telephone Encounter (Signed)
I called patient. The carotid Doppler study results were normal.

## 2013-12-25 ENCOUNTER — Telehealth: Payer: Self-pay | Admitting: Neurology

## 2013-12-25 NOTE — Telephone Encounter (Signed)
Patient is calling to get lab results

## 2013-12-25 NOTE — Telephone Encounter (Signed)
I spoke to patient and relayed that her labs were left on a voice message on 11-27-13.  I relayed again that the patient had unremarkable labs. She is returning home and wishes to have her medical records sent to her doctor at home.  I have referred her to our medical records department.

## 2014-05-30 DIAGNOSIS — H6983 Other specified disorders of Eustachian tube, bilateral: Secondary | ICD-10-CM | POA: Insufficient documentation

## 2014-06-28 DIAGNOSIS — M75100 Unspecified rotator cuff tear or rupture of unspecified shoulder, not specified as traumatic: Secondary | ICD-10-CM | POA: Insufficient documentation

## 2014-09-12 DIAGNOSIS — M792 Neuralgia and neuritis, unspecified: Secondary | ICD-10-CM | POA: Insufficient documentation

## 2014-09-12 DIAGNOSIS — H8110 Benign paroxysmal vertigo, unspecified ear: Secondary | ICD-10-CM | POA: Insufficient documentation

## 2014-12-20 DIAGNOSIS — Z96652 Presence of left artificial knee joint: Secondary | ICD-10-CM | POA: Insufficient documentation

## 2014-12-20 DIAGNOSIS — M25511 Pain in right shoulder: Secondary | ICD-10-CM | POA: Insufficient documentation

## 2014-12-20 DIAGNOSIS — G8929 Other chronic pain: Secondary | ICD-10-CM | POA: Insufficient documentation

## 2015-04-11 ENCOUNTER — Ambulatory Visit: Payer: Self-pay | Admitting: Neurosurgery

## 2015-04-11 ENCOUNTER — Encounter: Payer: Self-pay | Admitting: Neurosurgery

## 2015-04-11 DIAGNOSIS — H8111 Benign paroxysmal vertigo, right ear: Secondary | ICD-10-CM | POA: Insufficient documentation

## 2015-04-11 DIAGNOSIS — M542 Cervicalgia: Secondary | ICD-10-CM

## 2015-04-11 DIAGNOSIS — R2689 Other abnormalities of gait and mobility: Secondary | ICD-10-CM
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
## 2015-05-09 ENCOUNTER — Other Ambulatory Visit: Payer: Self-pay | Admitting: Gastroenterology

## 2015-05-09 ENCOUNTER — Ambulatory Visit: Payer: Self-pay | Admitting: Neurosurgery

## 2015-05-09 ENCOUNTER — Encounter: Payer: Self-pay | Admitting: Neurosurgery

## 2015-05-09 VITALS — BP 152/100 | HR 68 | Ht 60.0 in | Wt 221.0 lb

## 2015-05-09 DIAGNOSIS — H8111 Benign paroxysmal vertigo, right ear: Secondary | ICD-10-CM

## 2015-05-09 DIAGNOSIS — R2689 Other abnormalities of gait and mobility: Secondary | ICD-10-CM

## 2015-05-09 DIAGNOSIS — M542 Cervicalgia: Secondary | ICD-10-CM

## 2015-05-09 NOTE — Procedures (Signed)
Patient underwent Semont maneuver for left posterior canal benign positional vertigo.  She was dizzy on the first maneuver with all position change and less so on the second.  I did not see nystagmus.  She tolerated the procedure well.

## 2015-05-09 NOTE — Progress Notes (Signed)
Mariah Hunt was seen in follow-up on May 09, 2015 regarding difficulty with dizziness.  Patient complains that she is still having difficulty with dizziness.  She tried the exercises but they don't seem to be helpful.    MRI of her cervical spine reveals multilevel disc and facet degenerative disease with moderate central canal stenosis at C4-5, C5-6 and C6-C7.  Images were reviewed.    Patient apparently had EMG of her upper extremities.  I do not have that report.    Current Outpatient Prescriptions   Medication Sig Dispense Refill    omeprazole (PRILOSEC) 40 MG capsule Take 40 mg by mouth daily      metoprolol (LOPRESSOR) 25 MG tablet Take 25 mg by mouth 2 times daily   1/2 tablet twice a day      simvastatin (ZOCOR) 20 MG tablet Take 20 mg by mouth daily (with dinner)      aspirin 81 MG tablet Take 81 mg by mouth daily      cyanocobalamin (VITAMIN B-12) 1000 MCG tablet Take 1,000 mcg by mouth daily      Omega 3 1200 MG CAPS Take by mouth      Cinnamon 500 MG capsule Take 1,000 mg by mouth daily      Calcium Carbonate-Vit D-Min (CALCIUM 1200 PO) Take by mouth      Cholecalciferol (D-3-5) 5000 UNITS capsule Take 5,000 Units by mouth daily      vitamin A 1610910000 UNIT capsule Take 10,000 Units by mouth daily       No current facility-administered medications for this visit.      Allergies:  Sulfa antibiotics    Examination:  Vitals:    05/09/15 0816   BP: (!) 152/100   Pulse: 68   Weight: 100.2 kg (221 lb)   Height: 1.524 m (5')     Ms. Mariah Hunt appears her stated age.    Romberg:  She can hold a standard Romberg for 20 seconds.    Modified Hallpike:  She has no symptoms down to the right.  She has dizziness going down to the left.    Impression:  Patient still presents as though she has positional vertigo however on today's visit appears to be on the left side.    Cervical canal stenosis that is felt to be moderate.    Plan:  Patient was assisted with Semont maneuver for left posterior canal benign positional  vertigo.    Patient is asked to proceed with the VNG.  This will be scheduled with Dr. [place]ool[place].    Patient is asked to continue with Semont maneuver for the left side one night on the right side the following night.    Patient is asked to proceed with flexion-extension views of her cervical spine.    Consider neurosurgical referral.  Patient prefers [facility].    Patient is asked to sign release of records for the EMG.    Follow-up one month.

## 2015-06-18 ENCOUNTER — Ambulatory Visit: Payer: Self-pay | Admitting: Neurosurgery

## 2015-08-27 ENCOUNTER — Encounter: Payer: Self-pay | Admitting: Neurosurgery

## 2015-08-27 ENCOUNTER — Ambulatory Visit: Payer: Self-pay | Admitting: Neurosurgery

## 2015-08-27 VITALS — BP 159/80 | HR 68 | Ht 60.0 in | Wt 236.0 lb

## 2015-08-27 DIAGNOSIS — R2689 Other abnormalities of gait and mobility: Secondary | ICD-10-CM

## 2015-08-27 DIAGNOSIS — H8111 Benign paroxysmal vertigo, right ear: Secondary | ICD-10-CM

## 2015-08-27 NOTE — Progress Notes (Signed)
Mariah Hunt was seen in follow-up on August 27, 2015 regarding difficulty with dizziness.  She is doing some exercises with vestibular therapy.  She still has some slight dizziness.    Patient underwent VNG August 20, 2015.  This revealed moderate to severe high-frequency sensorineural hearing loss and suspected possible bilateral BPPV.  The VNG findings however were within normal limits with robust caloric responses bilaterally.  She did have some visually suppressed geotropic nystagmus of up to 2/s during positional testing with vision denied.  These results are within normal limits but may suggest a peripheral component.  Canalith repositioning is recommended on a trial basis.    Current Outpatient Prescriptions   Medication Sig Dispense Refill    fluticasone (VERAMYST) 27.5 MCG/SPRAY nasal spray 2 sprays by Each Nare route daily      omeprazole (PRILOSEC) 40 MG capsule Take 40 mg by mouth daily      metoprolol (LOPRESSOR) 25 MG tablet Take 25 mg by mouth 2 times daily   1/2 tablet twice a day      simvastatin (ZOCOR) 20 MG tablet Take 20 mg by mouth daily (with dinner)      aspirin 81 MG tablet Take 81 mg by mouth daily      cyanocobalamin (VITAMIN B-12) 1000 MCG tablet Take 1,000 mcg by mouth daily      Omega 3 1200 MG CAPS Take by mouth      Cinnamon 500 MG capsule Take 1,000 mg by mouth daily      Calcium Carbonate-Vit D-Min (CALCIUM 1200 PO) Take by mouth      Cholecalciferol (D-3-5) 5000 UNITS capsule Take 5,000 Units by mouth daily      vitamin A 1610910000 UNIT capsule Take 10,000 Units by mouth daily       No current facility-administered medications for this visit.      Allergies:  Sulfa antibiotics    Examination:  Vitals:    08/27/15 0816   BP: 159/80   Pulse: 68   Weight: 107 kg (236 lb)   Height: 1.524 m (5')     Mariah Hunt appears her stated age.    Romberg:  She can hold a standard Romberg for 20 seconds.    Modified Hallpike:  She has no symptoms going down to the left.  She has no symptoms  returning to sit from the left.  She does have slight dizziness sitting up from the right.  I did not see nystagmus.    Impression:  Sensation of dizziness that may represent BPPV however I cannot document that.    Plan:  Will add vestibular rehab to her current physical therapy as she has to attend anyway.  Hopefully this will be of benefit.    Follow-up in 2-3 months.

## 2015-10-31 DIAGNOSIS — M7542 Impingement syndrome of left shoulder: Secondary | ICD-10-CM | POA: Insufficient documentation

## 2015-12-03 ENCOUNTER — Ambulatory Visit: Payer: Self-pay | Admitting: Neurosurgery

## 2016-06-04 DIAGNOSIS — M65322 Trigger finger, left index finger: Secondary | ICD-10-CM | POA: Insufficient documentation

## 2017-10-26 ENCOUNTER — Other Ambulatory Visit: Payer: Self-pay | Admitting: Internal Medicine

## 2017-10-26 DIAGNOSIS — Z1231 Encounter for screening mammogram for malignant neoplasm of breast: Secondary | ICD-10-CM

## 2017-12-06 ENCOUNTER — Ambulatory Visit
Admission: RE | Admit: 2017-12-06 | Discharge: 2017-12-06 | Disposition: A | Discharge status: Home / Self Care | Payer: Medicare Other | Source: Ambulatory Visit | Attending: Internal Medicine | Admitting: Internal Medicine

## 2017-12-06 DIAGNOSIS — Z1231 Encounter for screening mammogram for malignant neoplasm of breast: Secondary | ICD-10-CM

## 2017-12-22 ENCOUNTER — Other Ambulatory Visit: Payer: Self-pay

## 2017-12-22 ENCOUNTER — Encounter (HOSPITAL_COMMUNITY): Payer: Self-pay | Admitting: Emergency Medicine

## 2017-12-22 ENCOUNTER — Emergency Department (HOSPITAL_COMMUNITY): Payer: Medicare Other

## 2017-12-22 ENCOUNTER — Inpatient Hospital Stay (HOSPITAL_COMMUNITY): Payer: Medicare Other

## 2017-12-22 ENCOUNTER — Inpatient Hospital Stay (HOSPITAL_COMMUNITY)
Admission: EM | Admit: 2017-12-22 | Discharge: 2017-12-25 | DRG: 125 | Disposition: A | Discharge status: Home Health | Payer: Medicare Other | Attending: Family Medicine | Admitting: Family Medicine

## 2017-12-22 DIAGNOSIS — Z6841 Body Mass Index (BMI) 40.0 and over, adult: Secondary | ICD-10-CM | POA: Diagnosis not present

## 2017-12-22 DIAGNOSIS — Z96652 Presence of left artificial knee joint: Secondary | ICD-10-CM | POA: Diagnosis present

## 2017-12-22 DIAGNOSIS — K219 Gastro-esophageal reflux disease without esophagitis: Secondary | ICD-10-CM | POA: Diagnosis present

## 2017-12-22 DIAGNOSIS — E782 Mixed hyperlipidemia: Secondary | ICD-10-CM | POA: Diagnosis present

## 2017-12-22 DIAGNOSIS — Z79899 Other long term (current) drug therapy: Secondary | ICD-10-CM | POA: Diagnosis not present

## 2017-12-22 DIAGNOSIS — R55 Syncope and collapse: Secondary | ICD-10-CM | POA: Diagnosis present

## 2017-12-22 DIAGNOSIS — S0232XA Fracture of orbital floor, left side, initial encounter for closed fracture: Secondary | ICD-10-CM | POA: Diagnosis not present

## 2017-12-22 DIAGNOSIS — Z23 Encounter for immunization: Secondary | ICD-10-CM

## 2017-12-22 DIAGNOSIS — T148XXA Other injury of unspecified body region, initial encounter: Secondary | ICD-10-CM

## 2017-12-22 DIAGNOSIS — G4733 Obstructive sleep apnea (adult) (pediatric): Secondary | ICD-10-CM | POA: Diagnosis present

## 2017-12-22 DIAGNOSIS — Z882 Allergy status to sulfonamides status: Secondary | ICD-10-CM

## 2017-12-22 DIAGNOSIS — S0285XA Fracture of orbit, unspecified, initial encounter for closed fracture: Secondary | ICD-10-CM | POA: Diagnosis not present

## 2017-12-22 DIAGNOSIS — I341 Nonrheumatic mitral (valve) prolapse: Secondary | ICD-10-CM | POA: Diagnosis present

## 2017-12-22 DIAGNOSIS — I10 Essential (primary) hypertension: Secondary | ICD-10-CM | POA: Diagnosis present

## 2017-12-22 DIAGNOSIS — G6289 Other specified polyneuropathies: Secondary | ICD-10-CM | POA: Diagnosis not present

## 2017-12-22 DIAGNOSIS — S060X9A Concussion with loss of consciousness of unspecified duration, initial encounter: Secondary | ICD-10-CM | POA: Diagnosis present

## 2017-12-22 DIAGNOSIS — R4182 Altered mental status, unspecified: Secondary | ICD-10-CM | POA: Diagnosis present

## 2017-12-22 DIAGNOSIS — W19XXXA Unspecified fall, initial encounter: Secondary | ICD-10-CM | POA: Diagnosis not present

## 2017-12-22 DIAGNOSIS — M1712 Unilateral primary osteoarthritis, left knee: Secondary | ICD-10-CM | POA: Diagnosis present

## 2017-12-22 DIAGNOSIS — R410 Disorientation, unspecified: Secondary | ICD-10-CM

## 2017-12-22 DIAGNOSIS — E669 Obesity, unspecified: Secondary | ICD-10-CM | POA: Diagnosis present

## 2017-12-22 DIAGNOSIS — G629 Polyneuropathy, unspecified: Secondary | ICD-10-CM | POA: Diagnosis present

## 2017-12-22 DIAGNOSIS — Z888 Allergy status to other drugs, medicaments and biological substances status: Secondary | ICD-10-CM

## 2017-12-22 DIAGNOSIS — S0231XA Fracture of orbital floor, right side, initial encounter for closed fracture: Secondary | ICD-10-CM | POA: Diagnosis present

## 2017-12-22 DIAGNOSIS — M8580 Other specified disorders of bone density and structure, unspecified site: Secondary | ICD-10-CM | POA: Diagnosis present

## 2017-12-22 DIAGNOSIS — Z91011 Allergy to milk products: Secondary | ICD-10-CM

## 2017-12-22 HISTORY — DX: Unspecified fall, initial encounter: W19.XXXA

## 2017-12-22 LAB — CBC WITH DIFFERENTIAL/PLATELET
Abs Immature Granulocytes: 0.01 10*3/uL (ref 0.00–0.07)
Basophils Absolute: 0 10*3/uL (ref 0.0–0.1)
Basophils Relative: 1 %
Eosinophils Absolute: 0.1 10*3/uL (ref 0.0–0.5)
Eosinophils Relative: 2 %
HCT: 41.2 % (ref 36.0–46.0)
Hemoglobin: 13.3 g/dL (ref 12.0–15.0)
Immature Granulocytes: 0 %
Lymphocytes Relative: 29 %
Lymphs Abs: 1.5 10*3/uL (ref 0.7–4.0)
MCH: 29.6 pg (ref 26.0–34.0)
MCHC: 32.3 g/dL (ref 30.0–36.0)
MCV: 91.8 fL (ref 80.0–100.0)
Monocytes Absolute: 0.5 10*3/uL (ref 0.1–1.0)
Monocytes Relative: 10 %
Neutro Abs: 2.9 10*3/uL (ref 1.7–7.7)
Neutrophils Relative %: 58 %
Platelets: 249 10*3/uL (ref 150–400)
RBC: 4.49 MIL/uL (ref 3.87–5.11)
RDW: 12.2 % (ref 11.5–15.5)
WBC: 5 10*3/uL (ref 4.0–10.5)
nRBC: 0 % (ref 0.0–0.2)

## 2017-12-22 LAB — COMPREHENSIVE METABOLIC PANEL
ALT: 13 U/L (ref 0–44)
AST: 23 U/L (ref 15–41)
Albumin: 3.5 g/dL (ref 3.5–5.0)
Alkaline Phosphatase: 77 U/L (ref 38–126)
Anion gap: 11 (ref 5–15)
BUN: 9 mg/dL (ref 8–23)
CO2: 25 mmol/L (ref 22–32)
Calcium: 9.3 mg/dL (ref 8.9–10.3)
Chloride: 102 mmol/L (ref 98–111)
Creatinine, Ser: 0.85 mg/dL (ref 0.44–1.00)
GFR calc Af Amer: >60 mL/min (ref >60)
GFR calc non Af Amer: >60 mL/min (ref >60)
Glucose, Bld: 135 mg/dL — ABNORMAL HIGH (ref 70–99)
Potassium: 3.9 mmol/L (ref 3.5–5.1)
Sodium: 138 mmol/L (ref 135–145)
Total Bilirubin: 0.6 mg/dL (ref 0.3–1.2)
Total Protein: 6.7 g/dL (ref 6.5–8.1)

## 2017-12-22 LAB — URINALYSIS, ROUTINE W REFLEX MICROSCOPIC
Bacteria, UA: NONE SEEN
Bilirubin Urine: NEGATIVE
Glucose, UA: NEGATIVE mg/dL
Ketones, ur: NEGATIVE mg/dL
Leukocytes, UA: NEGATIVE
Nitrite: NEGATIVE
Protein, ur: NEGATIVE mg/dL
Specific Gravity, Urine: 1.01 (ref 1.005–1.030)
pH: 8 (ref 5.0–8.0)

## 2017-12-22 LAB — TROPONIN I: Troponin I: <0.03 ng/mL (ref <0.03)

## 2017-12-22 LAB — PHOSPHORUS: Phosphorus: 3 mg/dL (ref 2.5–4.6)

## 2017-12-22 LAB — I-STAT TROPONIN, ED: Troponin i, poc: 0.01 ng/mL (ref 0.00–0.08)

## 2017-12-22 LAB — MAGNESIUM: Magnesium: 1.5 mg/dL — ABNORMAL LOW (ref 1.7–2.4)

## 2017-12-22 LAB — LIPASE, BLOOD: Lipase: 33 U/L (ref 11–51)

## 2017-12-22 LAB — CBG MONITORING, ED: Glucose-Capillary: 117 mg/dL — ABNORMAL HIGH (ref 70–99)

## 2017-12-22 MED ORDER — ACETAMINOPHEN 325 MG PO TABS: 650.0000 mg | ORAL_TABLET | Freq: Four times a day (QID) | ORAL | Status: DC | PRN

## 2017-12-22 MED ORDER — ACETAMINOPHEN 500 MG PO TABS
1000.0000 mg | ORAL_TABLET | Freq: Three times a day (TID) | ORAL | Status: DC
  Administered 2017-12-22 – 2017-12-25 (×8): 1000 mg via ORAL
  Filled 2017-12-22 (×8): qty 2

## 2017-12-22 MED ORDER — ONDANSETRON HCL 4 MG PO TABS
4.0000 mg | ORAL_TABLET | Freq: Four times a day (QID) | ORAL | Status: DC | PRN
  Administered 2017-12-23: 4 mg via ORAL
  Filled 2017-12-22: qty 1

## 2017-12-22 MED ORDER — ONDANSETRON HCL 4 MG/2ML IJ SOLN
4.0000 mg | Freq: Once | INTRAMUSCULAR | Status: AC
  Administered 2017-12-22: 4 mg via INTRAVENOUS
  Filled 2017-12-22: qty 2

## 2017-12-22 MED ORDER — PANTOPRAZOLE SODIUM 40 MG PO TBEC
40.0000 mg | DELAYED_RELEASE_TABLET | Freq: Every day | ORAL | Status: DC
  Administered 2017-12-23 – 2017-12-25 (×3): 40 mg via ORAL
  Filled 2017-12-22 (×3): qty 1

## 2017-12-22 MED ORDER — TETANUS-DIPHTH-ACELL PERTUSSIS 5-2.5-18.5 LF-MCG/0.5 IM SUSP
0.5000 mL | Freq: Once | INTRAMUSCULAR | Status: AC
  Administered 2017-12-22: 0.5 mL via INTRAMUSCULAR
  Filled 2017-12-22: qty 0.5

## 2017-12-22 MED ORDER — ACETAMINOPHEN 650 MG RE SUPP: 650.0000 mg | Freq: Four times a day (QID) | RECTAL | Status: DC | PRN

## 2017-12-22 MED ORDER — METOCLOPRAMIDE HCL 5 MG/ML IJ SOLN
10.0000 mg | Freq: Once | INTRAMUSCULAR | Status: AC
  Administered 2017-12-22: 10 mg via INTRAVENOUS
  Filled 2017-12-22: qty 2

## 2017-12-22 MED ORDER — SODIUM CHLORIDE 0.9% FLUSH
3.0000 mL | Freq: Two times a day (BID) | INTRAVENOUS | Status: DC
  Administered 2017-12-22 – 2017-12-24 (×4): 3 mL via INTRAVENOUS

## 2017-12-22 MED ORDER — DEXTROSE-NACL 5-0.45 % IV SOLN
INTRAVENOUS | Status: DC
  Administered 2017-12-22 – 2017-12-23 (×2): via INTRAVENOUS

## 2017-12-22 MED ORDER — ASPIRIN 81 MG PO TABS: 81.0000 mg | ORAL_TABLET | Freq: Every day | ORAL | Status: DC

## 2017-12-22 MED ORDER — ONDANSETRON HCL 4 MG/2ML IJ SOLN
4.0000 mg | Freq: Four times a day (QID) | INTRAMUSCULAR | Status: DC | PRN
  Administered 2017-12-22 – 2017-12-24 (×2): 4 mg via INTRAVENOUS
  Filled 2017-12-22 (×2): qty 2

## 2017-12-22 MED ORDER — SIMVASTATIN 20 MG PO TABS
20.0000 mg | ORAL_TABLET | Freq: Every day | ORAL | Status: DC
  Filled 2017-12-22: qty 1

## 2017-12-22 MED ORDER — HEPARIN SODIUM (PORCINE) 5000 UNIT/ML IJ SOLN: 5000.0000 [IU] | Freq: Three times a day (TID) | INTRAMUSCULAR | Status: DC

## 2017-12-22 NOTE — ED Provider Notes (Signed)
[facility] EMERGENCY DEPARTMENT Provider Note   CSN: [id number] Arrival date & time: 12/22/17  1426     History   Chief Complaint Chief Complaint  Patient presents with  . Fall  . Nausea    HPI Sydney Booth is a 84 y.o. female with a past medical history of hypertension, hyperlipidemia, GERD, cervical vertebral fracture, who presents today for evaluation after a fall.  She was outside the chiropractor's office when she fell.  She is confused and unable to further contribute to history.  History obtained from EMS and patient's daughter.  Patient's daughter reports that her mother was walking into the chiropractor's office to get adjusted when she fell.  She has not had an adjustment in approximately 2 weeks.  Patient reports feeling like her face is tingly.  She does not remember how she got here or what happened.    HPI  Past Medical History:  Diagnosis Date  . Arthritis   . Blood transfusion   . Cervical vertebral fracture (HCC)    fued on its on  . Closed head injury    as a chlid  . Gastric ulcer   . GERD (gastroesophageal reflux disease)   . H/O hiatal hernia   . Headache(784.0)   . Hyperlipemia   . Hypertension   . Mitral valve prolapse   . Obesity   . OSA on CPAP   . Osteopenia   . Rotator cuff tear    Left  . Seasonal allergies   . Shortness of breath    With activity  . Ulcer 1991   [place]     Patient Active Problem List   Diagnosis Date Noted  . Dizzy 11/24/2013  . Blurred vision 11/24/2013  . Mixed hyperlipidemia 02/09/2013  . 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 HTN (hypertension) 03/24/2011    Class: Chronic    Past Surgical History:  Procedure Laterality Date  . BIospy     . Biospy      Uterus wall  . Carparal Tunnel    . CATARACT EXTRACTION    . Colonscopy    . EYE SURGERY     Right 2011, Left 2008  . LIPOMA EXCISION     FROM BACK   . THUMB FUSION    . TOTAL KNEE ARTHROPLASTY  03/24/2011   Procedure:  TOTAL KNEE ARTHROPLASTY;  Surgeon: Hessie Dibble, MD;  Location: [facility];  Service: Orthopedics;  Laterality: Left;  with revision tibial component  . TUBAL LIGATION       OB History   No obstetric history on file.      Home Medications    Prior to Admission medications   Medication Sig Start Date End Date Taking? Authorizing Provider  Ascorbic Acid (VITAMIN C PO) Take 1 tablet by mouth daily.   Yes [provider]  aspirin 81 MG tablet Take 81 mg by mouth daily.   Yes [provider]  calcium carbonate (OS-CAL) 600 MG TABS Take 600 mg by mouth daily.   Yes [provider]  CINNAMON PO Take 500 mg by mouth 2 (two) times daily.   Yes [provider]  metoprolol tartrate (LOPRESSOR) 25 MG tablet Take 25 mg by mouth 2 (two) times daily.    Yes [provider]  Omega-3 Fatty Acids (FISH OIL) 1000 MG CPDR Take 1,000 mg by mouth daily.   Yes [provider]  omeprazole (PRILOSEC) 40 MG capsule Take 40  mg by mouth daily.   Yes [provider]  simvastatin (ZOCOR) 20 MG tablet Take 20 mg by mouth daily.   Yes [provider]  VITAMIN D, CHOLECALCIFEROL, PO Take 1 tablet by mouth daily.   Yes [provider]    Family History Family History  Problem Relation Age of Onset  . CVA Mother   . Cancer Father        COLON, LIVER AND PANCREAS  . Sleep apnea Brother   . Pulmonary embolism Brother   . Anesthesia problems Neg Hx   . Breast cancer Neg Hx     Social History Social History   Tobacco Use  . Smoking status: Never Smoker  . Smokeless tobacco: Never Used  Substance Use Topics  . Alcohol use: No    Alcohol/week: 0.0 standard drinks  . Drug use: No     Allergies   Sulfa antibiotics and Food   Review of Systems Review of Systems  Unable to perform ROS: Mental status change     Physical Exam Updated Vital Signs BP (!) 149/98   Pulse 73   Temp 98 F (36.7 C) (Oral)   Resp 14   SpO2  97%   Physical Exam Vitals signs and nursing note reviewed.  Constitutional:      General: She is not in acute distress.    Appearance: She is well-developed.  HENT:     Head: Normocephalic.     Right Ear: Tympanic membrane, ear canal and external ear normal.     Left Ear: Tympanic membrane, ear canal and external ear normal.     Nose: Nose normal.     Mouth/Throat:     Mouth: Mucous membranes are moist.  Eyes:     Comments: Obvious edema around right eye with ecchymosis and abrasions.  To the right side of her face. Redness lateral to the pupil consistent with subconjunctival hematoma.   Neck:     Musculoskeletal: Normal range of motion and neck supple.  Cardiovascular:     Rate and Rhythm: Normal rate and regular rhythm.     Pulses: Normal pulses.     Heart sounds: Normal heart sounds. No murmur.  Pulmonary:     Effort: Pulmonary effort is normal. No respiratory distress.     Breath sounds: Normal breath sounds.  Abdominal:     General: There is no distension.     Palpations: Abdomen is soft. There is no mass.     Tenderness: There is no abdominal tenderness.     Hernia: No hernia is present.  Skin:    General: Skin is warm and dry.  Neurological:     Mental Status: She is alert.     Comments: No pronator drift.  Pupils PEARL.  Pain on right eye with ROM, and she stops tracking which she says is from pain.  Able to lift bilateral legs off the bed.  No facial droop. She knows who she is, knows she is in a hospital.  She knows Trump is president, doesn't know the year and thinks it is spring.  She asks the same question multiple times.       ED Treatments / Results  Labs (all labs ordered are listed, but only abnormal results are displayed) Labs Reviewed  URINALYSIS, ROUTINE W REFLEX MICROSCOPIC - Abnormal; Notable for the following components:      Result Value   Color, Urine STRAW (*)    Hgb urine dipstick SMALL (*)  All other components within normal limits    COMPREHENSIVE METABOLIC PANEL - Abnormal; Notable for the following components:   Glucose, Bld 135 (*)    All other components within normal limits  CBG MONITORING, ED - Abnormal; Notable for the following components:   Glucose-Capillary 117 (*)    All other components within normal limits  CBC WITH DIFFERENTIAL/PLATELET  LIPASE, BLOOD  I-STAT TROPONIN, ED    EKG None  Radiology Ct Head Wo Contrast  Result Date: 12/22/2017 CLINICAL DATA:  Recent fall with facial pain, initial encounter EXAM: CT HEAD WITHOUT CONTRAST CT MAXILLOFACIAL WITHOUT CONTRAST CT CERVICAL SPINE WITHOUT CONTRAST TECHNIQUE: Multidetector CT imaging of the head, cervical spine, and maxillofacial structures were performed using the standard protocol without intravenous contrast. Multiplanar CT image reconstructions of the cervical spine and maxillofacial structures were also generated. COMPARISON:  11/05/2009 MRI of the lumbar spine. FINDINGS: CT HEAD FINDINGS Brain: Mild atrophic changes are noted commensurate with the patient's given age. No findings to suggest acute hemorrhage, acute infarct or space-occupying mass lesion are noted. Scattered hypodensities are noted consistent with chronic white matter ischemic change. Vascular: No hyperdense vessel or unexpected calcification. Skull: No acute fracture is identified. Other: Soft tissue swelling is noted over the right eye consistent with the recent injury. Air-fluid level is noted within the right maxillary antrum as well as fracture through the inferior orbital wall. CT MAXILLOFACIAL FINDINGS Osseous: There are changes consistent with a blowout fracture involving the right orbit with downward depression of the inferior orbital wall identified. No muscular entrapment is noted. Air-fluid level is noted within the right maxillary antrum with evidence of hemorrhage consistent with the recent injury. No other fracture is seen. Orbits: The globes are unremarkable bilaterally.  Some air is noted within the right orbit posteriorly related to communication with the right maxillary antrum secondary to the known fracture. Right periorbital soft tissue swelling is seen. Sinuses: Air-fluid level with hemorrhage is noted within the right maxillary antrum. Some mucosal thickening is noted within the nasal passages as well as within the ethmoid air cells on the right. No other focal air-fluid level is seen. Soft tissues: Considerable soft tissue swelling is noted over the right orbit consistent with the recent injury. CT CERVICAL SPINE FINDINGS Alignment: There is loss of the normal cervical lordosis. Mild anterolisthesis of C2 on C3 and C3 on C4 as well as C4 on C5 is noted related to facet hypertrophic changes. Mild retrolisthesis of C5 with respect to C4 and C6 is seen but stable from a prior MRI examination of 2011. Skull base and vertebrae: Vertebral body height is well maintained. Multilevel facet hypertrophic changes are noted as well as osteophytic changes which contribute to central canal stenosis. The retrolisthesis of C5 with respect to C6 also contributes to central canal stenosis as well as neural foraminal narrowing. Multilevel neural foraminal narrowing is seen related to the facet hypertrophic changes. No acute fracture or acute facet abnormality is noted. Soft tissues and spinal canal: No significant spinal cord compression is noted. Surrounding soft tissues are within normal limits. Upper chest: Within normal limits. Other: None IMPRESSION: CT of the head: Mild atrophic and chronic white matter ischemic changes commensurate with the patient's given age. CT of the maxillofacial bones: Considerable soft tissue swelling is noted over the right orbit with evidence of fracture through the inferior orbital wall with downward displacement of the fracture fragment. No muscular entrapment is noted. Associated air-fluid level with hemorrhage is noted within the right  maxillary antrum. No  other fractures are seen. CT of the cervical spine: No acute abnormality noted. Multilevel degenerative change which has progressed from 2011. Stable retrolisthesis of C5 with respect to C4 and C6 is noted. Electronically Signed   By: Inez Catalina M.D.   On: 12/22/2017 15:19   Ct Cervical Spine Wo Contrast  Result Date: 12/22/2017 CLINICAL DATA:  Recent fall with facial pain, initial encounter EXAM: CT HEAD WITHOUT CONTRAST CT MAXILLOFACIAL WITHOUT CONTRAST CT CERVICAL SPINE WITHOUT CONTRAST TECHNIQUE: Multidetector CT imaging of the head, cervical spine, and maxillofacial structures were performed using the standard protocol without intravenous contrast. Multiplanar CT image reconstructions of the cervical spine and maxillofacial structures were also generated. COMPARISON:  11/05/2009 MRI of the lumbar spine. FINDINGS: CT HEAD FINDINGS Brain: Mild atrophic changes are noted commensurate with the patient's given age. No findings to suggest acute hemorrhage, acute infarct or space-occupying mass lesion are noted. Scattered hypodensities are noted consistent with chronic white matter ischemic change. Vascular: No hyperdense vessel or unexpected calcification. Skull: No acute fracture is identified. Other: Soft tissue swelling is noted over the right eye consistent with the recent injury. Air-fluid level is noted within the right maxillary antrum as well as fracture through the inferior orbital wall. CT MAXILLOFACIAL FINDINGS Osseous: There are changes consistent with a blowout fracture involving the right orbit with downward depression of the inferior orbital wall identified. No muscular entrapment is noted. Air-fluid level is noted within the right maxillary antrum with evidence of hemorrhage consistent with the recent injury. No other fracture is seen. Orbits: The globes are unremarkable bilaterally. Some air is noted within the right orbit posteriorly related to communication with the right maxillary antrum  secondary to the known fracture. Right periorbital soft tissue swelling is seen. Sinuses: Air-fluid level with hemorrhage is noted within the right maxillary antrum. Some mucosal thickening is noted within the nasal passages as well as within the ethmoid air cells on the right. No other focal air-fluid level is seen. Soft tissues: Considerable soft tissue swelling is noted over the right orbit consistent with the recent injury. CT CERVICAL SPINE FINDINGS Alignment: There is loss of the normal cervical lordosis. Mild anterolisthesis of C2 on C3 and C3 on C4 as well as C4 on C5 is noted related to facet hypertrophic changes. Mild retrolisthesis of C5 with respect to C4 and C6 is seen but stable from a prior MRI examination of 2011. Skull base and vertebrae: Vertebral body height is well maintained. Multilevel facet hypertrophic changes are noted as well as osteophytic changes which contribute to central canal stenosis. The retrolisthesis of C5 with respect to C6 also contributes to central canal stenosis as well as neural foraminal narrowing. Multilevel neural foraminal narrowing is seen related to the facet hypertrophic changes. No acute fracture or acute facet abnormality is noted. Soft tissues and spinal canal: No significant spinal cord compression is noted. Surrounding soft tissues are within normal limits. Upper chest: Within normal limits. Other: None IMPRESSION: CT of the head: Mild atrophic and chronic white matter ischemic changes commensurate with the patient's given age. CT of the maxillofacial bones: Considerable soft tissue swelling is noted over the right orbit with evidence of fracture through the inferior orbital wall with downward displacement of the fracture fragment. No muscular entrapment is noted. Associated air-fluid level with hemorrhage is noted within the right maxillary antrum. No other fractures are seen. CT of the cervical spine: No acute abnormality noted. Multilevel degenerative change  which has progressed  from 2011. Stable retrolisthesis of C5 with respect to C4 and C6 is noted. Electronically Signed   By: Inez Catalina M.D.   On: 12/22/2017 15:19   Dg Chest Portable 1 View  Result Date: 12/22/2017 CLINICAL DATA:  84 year old female status post trip and fall today. Syncope. EXAM: PORTABLE CHEST 1 VIEW COMPARISON:  Chest radiographs 03/13/2011. FINDINGS: Portable AP semi upright view at 1616 hours. Mediastinal contours are stable and within normal limits. Stable lung volumes. Allowing for portable technique the lungs are clear. Visualized tracheal air column is within normal limits. Osteopenia. No acute osseous abnormality identified. IMPRESSION: No acute cardiopulmonary abnormality or acute traumatic injury identified. Electronically Signed   By: Genevie Lillie M.D.   On: 12/22/2017 16:29   Ct Maxillofacial Wo Cm  Result Date: 12/22/2017 CLINICAL DATA:  Recent fall with facial pain, initial encounter EXAM: CT HEAD WITHOUT CONTRAST CT MAXILLOFACIAL WITHOUT CONTRAST CT CERVICAL SPINE WITHOUT CONTRAST TECHNIQUE: Multidetector CT imaging of the head, cervical spine, and maxillofacial structures were performed using the standard protocol without intravenous contrast. Multiplanar CT image reconstructions of the cervical spine and maxillofacial structures were also generated. COMPARISON:  11/05/2009 MRI of the lumbar spine. FINDINGS: CT HEAD FINDINGS Brain: Mild atrophic changes are noted commensurate with the patient's given age. No findings to suggest acute hemorrhage, acute infarct or space-occupying mass lesion are noted. Scattered hypodensities are noted consistent with chronic white matter ischemic change. Vascular: No hyperdense vessel or unexpected calcification. Skull: No acute fracture is identified. Other: Soft tissue swelling is noted over the right eye consistent with the recent injury. Air-fluid level is noted within the right maxillary antrum as well as fracture through the inferior  orbital wall. CT MAXILLOFACIAL FINDINGS Osseous: There are changes consistent with a blowout fracture involving the right orbit with downward depression of the inferior orbital wall identified. No muscular entrapment is noted. Air-fluid level is noted within the right maxillary antrum with evidence of hemorrhage consistent with the recent injury. No other fracture is seen. Orbits: The globes are unremarkable bilaterally. Some air is noted within the right orbit posteriorly related to communication with the right maxillary antrum secondary to the known fracture. Right periorbital soft tissue swelling is seen. Sinuses: Air-fluid level with hemorrhage is noted within the right maxillary antrum. Some mucosal thickening is noted within the nasal passages as well as within the ethmoid air cells on the right. No other focal air-fluid level is seen. Soft tissues: Considerable soft tissue swelling is noted over the right orbit consistent with the recent injury. CT CERVICAL SPINE FINDINGS Alignment: There is loss of the normal cervical lordosis. Mild anterolisthesis of C2 on C3 and C3 on C4 as well as C4 on C5 is noted related to facet hypertrophic changes. Mild retrolisthesis of C5 with respect to C4 and C6 is seen but stable from a prior MRI examination of 2011. Skull base and vertebrae: Vertebral body height is well maintained. Multilevel facet hypertrophic changes are noted as well as osteophytic changes which contribute to central canal stenosis. The retrolisthesis of C5 with respect to C6 also contributes to central canal stenosis as well as neural foraminal narrowing. Multilevel neural foraminal narrowing is seen related to the facet hypertrophic changes. No acute fracture or acute facet abnormality is noted. Soft tissues and spinal canal: No significant spinal cord compression is noted. Surrounding soft tissues are within normal limits. Upper chest: Within normal limits. Other: None IMPRESSION: CT of the head: Mild  atrophic and chronic white matter ischemic  changes commensurate with the patient's given age. CT of the maxillofacial bones: Considerable soft tissue swelling is noted over the right orbit with evidence of fracture through the inferior orbital wall with downward displacement of the fracture fragment. No muscular entrapment is noted. Associated air-fluid level with hemorrhage is noted within the right maxillary antrum. No other fractures are seen. CT of the cervical spine: No acute abnormality noted. Multilevel degenerative change which has progressed from 2011. Stable retrolisthesis of C5 with respect to C4 and C6 is noted. Electronically Signed   By: Inez Catalina M.D.   On: 12/22/2017 15:19    Procedures Procedures (including critical care time)  Medications Ordered in ED Medications  Tdap (BOOSTRIX) injection 0.5 mL (has no administration in time range)  ondansetron (ZOFRAN) injection 4 mg (4 mg Intravenous Given 12/22/17 1452)  metoCLOPramide (REGLAN) injection 10 mg (10 mg Intravenous Given 12/22/17 1604)     Initial Impression / Assessment and Plan / ED Course  I have reviewed the triage vital signs and the nursing notes.  Pertinent labs & imaging results that were available during my care of the patient were reviewed by me and considered in my medical decision making (see chart for details).  Clinical Course as of Dec 23 1746  Wed Dec 22, 2017  1447 Called CT scan to expedite process.    [EH]  1452 Patient gave me permission to speak with her daughter Stanton Kidney.  She is unsure why she is here and does not remember events leading up to her coming here.  Patient's daughter Stanton Kidney reports that her mother was walking into the chiropractor's office to get adjusted, had not yet gotten adjusted when she fell on the sidewalk.   [EH]  1611 Patient reevaluated, she is still dry heaving.  She remains oriented to self and aware that Trump is president, however is unsure what year it is, that it is spring  outside and does not remember leaving the house this morning.     [EH]  1612 She remains with out pronator drift, able to lift bilateral legs off the bed.  Pupils PEARL.  Attempted to re-test EOM, patient stops following saying that it hurts. When attempting to test laterally.    [EH]  10 Dr. Wilburn Cornelia from ENT states that patient does not need antibiotics.  He recommends follow-up as an outpatient in his office in 1 week when swelling has reduced.  Patient is not to blow her nose.  Recommend slight elevation of the head of the bed to help reduce swelling.   [EH]  1731 Spoke with hospitalist team who will admit patient.   [EH]  1731 Patient is no longer dry heaving.   [EH]    Clinical Course User Index [EH] Lorin Glass, PA-C    Patient presents today for evaluation after she fell today.  She was outside of a chiropractor's office going into get adjusted, she did not get any adjustments prior to her falling.  Fall was witnessed by her daughter who reports that she fell did not trip.  Upon arrival patient had obvious edema around her right eye with dry heaving despite getting Zofran raising concern for closed head injury.  CT head, face, and neck was expedited, showing no intracranial hemorrhage at this time, concern for right-sided orbital fracture.  Patient is not anemic.  Troponin is not elevated, lipase is not elevated.  She does not have significant electrolyte abnormalities, urine is not infected.  She was treated with Zofran without  relief of her dry heaving, then given Reglan which provided adequate relief.  Tdap is updated.  I spoke with Dr. Wilburn Cornelia from ENT regarding orbital fracture who recommends outpatient follow-up in his office in 1 week, with instructions to patient not to blow her nose, can use ice or elevate head of bed as needed for swelling.    CT scan does not show evidence of entrapment, clinically unable to fully assess as once right eye gets to midline patient  closes her eyes and says it hurts too much, not that she is unable to move her eye.  Patient remained confused while in the department.  She is oriented to person, and place however not fully oriented to time, she knows that Trump is president however is unsure of the year, believes it is spring and does not remember any of the events today.    Patient will be admitted to the hospital for further monitoring, repeat neurologic exams, and syncope work-up.  Patient was seen as a shared visit with Dr. Sedonia Small upon patient arrival.  Final Clinical Impressions(s) / ED Diagnoses   Final diagnoses:  Fall, initial encounter  Confusion  Closed fracture of left orbital floor, initial encounter [facility])  Abrasion    ED Discharge Orders    None       Lorin Glass, PA-C 12/22/17 1816    Maudie Flakes, MD 12/25/17 1454

## 2017-12-22 NOTE — ED Notes (Signed)
Patient transported to CT 

## 2017-12-22 NOTE — ED Triage Notes (Signed)
Per EMS: pt went to chiropractor this AM, tripped and fell in front of office.  No LOC.  Pt c/o nausea and headache.  EMS administered 4mg  Zofran PTA.   Pt also complaining of right sided facial numbness.    EMS vitals:  BP 132/80 Sp02 98% RA Pulse 74  CBG 119

## 2017-12-22 NOTE — H&P (Addendum)
[facility] Admission History and Physical Service Pager: [phone]  Patient name: Sydney Booth Medical record number: [id number] Date of birth: 06/04/1931 Age: 84 y.o. Gender: female  Primary Care Provider: Marney Doctor, MD Consultants: ENT Code Status: Full   Chief Complaint: Fall with orbital fracture, possible syncope event  Assessment and Plan: JOVAN SCHICKLING is a 84 y.o. female presenting after a sudden fall, found to have a left sided orbital fracture and presenting for further syncopal work-up. PMH is significant for hypertension, hyperlipidemia, left knee DJD, and "leaky" heart valve.  Sudden Onset Fall, possible Syncopal Event: Acute.  Daughter reports sudden onset fall directly onto her face as she stepped onto the curb, patient does not remember episode. Daughter witnessed mid fall, unable to comment to any precipitating event, and not sure if she put her arms out to catch herself. VSS, satting well on RA, alert and no focal neuro deficits on exam.  EKG NSR without ischemic changes. Glucose WNL. Troponin POC 0.01.  Main concern for cardiac etiology (arrhthymia vs ACS vs valvular) given the sudden onset fall, however reassuring that she has a minimal cardiac history and EKG NSR with troponin 0.01 thus far. Could also still consider a mechanical fall as she was stepping up onto the curb, but would have expected her to try to catch her fall.  However, she may not have had quick enough reflexes to stop her fall. Could consider orthostatic hypotension, but less likely given the rapid nature of fall and blood pressure WNL on EMS/ED arrival.  Also considered neurological etiology, however no previous history of seizures without any witnessed seizure activity and no focal neuro deficits on exam to suggest concern for a CVA preceding the fall.  - Admit to telemetry, attending Dr. Mingo Amber - Obtain echocardiogram - Orthostatic vitals in the a.m. - Obtain mag, phos  levels - Obtain troponin, trend as appropriate - EKG in the a.m. - Monitor BMP - Vitals per routine, monitor telemetry - PT/OT eval  AMS s/p Fall: Acute.  Patient alert and oriented to person and place, not time.  Also repeating questions as if she has not asked them or heard answers already.  Baseline is completely alert/oriented and able to do ADLs around the house.  Altered status is likely secondary to concussion in the setting of fall with orbital fracture as below.  CT head without acute intracranial findings or hemorrhage, however continued concern for hemorrhage given significant fracture, fall with mental status changes, and significant nausea on arrival to ED despite antinausea medications.  Unlikely related to CVA, no focal neuro deficits on exam and speech appropriate.  Unlikely metabolic, electrolytes, BUN, and glucose WNL.  Also very low concern for infectious etiology, pt afebrile without symptomatology, U/A clear, and CXR without acute cardiopulmonary disease. - Obtain MRI brain - Frequent neurological checks every 4 hours - Monitor mental status - Zofran PRN nausea - Holding pharmacological prophylaxis in the setting of r/o bleeding, SCDs in place   R Inferior Orbital Fracture 2/2 Fall: Acute.  Pt with significant ecchymoses and swelling around right eye, bruising/scratches diffusely on right side of face.  Significant conjunctival injection.  Minimal movement due to pain, however preserved vision in eye.  CT maxillofacial showing an inferior orbital wall fracture with downward displacement of the fracture fragment,  considerable soft tissue swelling overlying, and air-fluid level suggesting hemorrhage in the right maxillary antrum.  Orbit noted to be intact and circular on CT.  ENT consulted  in the ED, will follow-up outpatient in about 1 week - indicated antibiotics are not needed at this time.  Also discussed eye findings with ophthalmology, who recommended no acute interventions  needed as she has preserved vision and no evidence of ruptured globe. Limited EOM due to pain;  extraocular muscle entrapment is less likely due to size of the fracture - ENT and ophthalmology consulted, no acute intervention or antibiotics needed at this time - ENT additionally recommending no nose blowing and elevating bed to help minimize swelling - patient may follow up with ophtho in 2 weeks  - Brain MRI as above, monitoring bleeding - Cool compresses as requested - Tylenol 1000 mg scheduled 3 times daily, can provide additional pain medication if needed, patient not endorsing pain currently  Hypertension: Chronic, stable.  SBP 160s on admit.  Likely elevated in the setting of recent fall.  Takes metoprolol 25 mg twice daily for control. -Monitor BP -Holding home metoprolol in the setting of syncopal event  Hyperlipidemia: Chronic, stable.  Takes simvastatin 20 mg at home. - Continue home simvastatin  FEN/GI: NPO, SLP evaluation (concern giving extensive facial swelling/orbital fx), D5 1/2 NS while NPO  Prophylaxis: Holding pharmacological prophylaxis pending MRI, SCDs in place  Disposition: Admit to telemetry, attending Dr. Mingo Amber  History of Present Illness:  Sydney Booth is a 84 y.o. female with a past history significant for hypertension presenting after a fall. Majority of history is provided by daughter and granddaughter at bedside, patient does not remember the event. The patient was just dropped off to go into a chiropractor's office, she just stepped up off of a curb, when her daughter saw her immediately fall down onto her face.  Daughter is unsure if she stuck her arms out to catch her fall, but states she went down so quickly that maybe she did not have a chance. She is also unsure if there was any precipitating event prior to the fall including LOC, chest pain, shortness of breath, palpitations. The daughter is unable to state whether the patient tripped on the sidewalk.  From  there, with the help of the office staff, the patient was immediately brought into the chiropractor's office and sat in the chair.  The patient started shaking her bilateral arms, with a tense grip, and kept saying "I do not feel good." Prior to this event today, daughter endorses she was of her usual state of health. Family state she had not had any recent fever, illness, chest pain, palpitations, SOB, coughing, abdominal pains, or weakness/numbness.  No previous falls prior to this.  However, they states she has had a history of feeling dizzy like the room is spinning, was started on a medication for vertigo in the past that was not beneficial.  At her baseline, she is fully alert and oriented and able to do her ADLs at home. The patient reports that she does not remember today, she does not remember falling.   Of note, she does have a history of a "leaky "heart valve for several years.  She will have echocardiograms done periodically and has been told that no intervention is needed for this.  She lives at home with her daughter and granddaughter.  ED Course: On arrival, patient was hemodynamically stable and in mild distress due to pain in her right eye.  CT head, cervical spine, maxillofacial were obtained showing no acute intracranial findings or hemorrhage, but noted a fracture through the inferior orbital wall with downward displacement of the  fracture fragment and soft tissue swelling overlying region.  CXR without acute cardiopulmonary findings.  Labs notable for glucose 135, creatinine 0.85, electrolytes WNL, white blood count 5, hemoglobin 13.3.  U/a clear.  EKG NSR without ischemic changes noted.  Troponin POC 0.01.  ENT was consulted for the orbital fracture, recommended following up outpatient and no antibiotics needed at this time.  Received Zofran for significant nausea on arrival.   Review Of Systems: Per HPI with the following additions:   Review of Systems  Constitutional: Negative for  chills, fever and malaise/fatigue.  Eyes: Positive for pain and redness.  Respiratory: Negative for cough, sputum production and shortness of breath.   Cardiovascular: Negative for chest pain, palpitations and orthopnea.  Gastrointestinal: Negative for abdominal pain, constipation, diarrhea, heartburn, nausea and vomiting.  Genitourinary: Negative for dysuria.  Musculoskeletal: Positive for falls.  Skin: Negative for rash.  Neurological: Positive for dizziness. Negative for weakness and headaches.    Patient Active Problem List   Diagnosis Date Noted  . Dizzy 11/24/2013  . Blurred vision 11/24/2013  . Mixed hyperlipidemia 02/09/2013  . 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 HTN (hypertension) 03/24/2011    Class: Chronic    Past Medical History: Past Medical History:  Diagnosis Date  . Arthritis   . Blood transfusion   . Cervical vertebral fracture (HCC)    fued on its on  . Closed head injury    as a chlid  . Gastric ulcer   . GERD (gastroesophageal reflux disease)   . H/O hiatal hernia   . Headache(784.0)   . Hyperlipemia   . Hypertension   . Mitral valve prolapse   . Obesity   . OSA on CPAP   . Osteopenia   . Rotator cuff tear    Left  . Seasonal allergies   . Shortness of breath    With activity  . Ulcer 1991   [place]     Past Surgical History: Past Surgical History:  Procedure Laterality Date  . BIospy     . Biospy      Uterus wall  . Carparal Tunnel    . CATARACT EXTRACTION    . Colonscopy    . EYE SURGERY     Right 2011, Left 2008  . LIPOMA EXCISION     FROM BACK   . THUMB FUSION    . TOTAL KNEE ARTHROPLASTY  03/24/2011   Procedure: TOTAL KNEE ARTHROPLASTY;  Surgeon: Hessie Dibble, MD;  Location: [facility];  Service: Orthopedics;  Laterality: Left;  with revision tibial component  . TUBAL LIGATION      Social History: Social History   Tobacco Use  . Smoking status: Never Smoker  . Smokeless tobacco: Never Used  Substance Use  Topics  . Alcohol use: No    Alcohol/week: 0.0 standard drinks  . Drug use: No   Please also refer to relevant sections of EMR.  Family History: Family History  Problem Relation Age of Onset  . CVA Mother   . Cancer Father        COLON, LIVER AND PANCREAS  . Sleep apnea Brother   . Pulmonary embolism Brother   . Anesthesia problems Neg Hx   . Breast cancer Neg Hx     Allergies and Medications: Allergies  Allergen Reactions  . Sulfa Antibiotics Hives  . Food     Milk  Dariey products   No current facility-administered medications on file prior to encounter.  Current Outpatient Medications on File Prior to Encounter  Medication Sig Dispense Refill  . Ascorbic Acid (VITAMIN C PO) Take 1 tablet by mouth daily.    Marland Kitchen aspirin 81 MG tablet Take 81 mg by mouth daily.    . calcium carbonate (OS-CAL) 600 MG TABS Take 600 mg by mouth daily.    Marland Kitchen CINNAMON PO Take 500 mg by mouth 2 (two) times daily.    . metoprolol tartrate (LOPRESSOR) 25 MG tablet Take 25 mg by mouth 2 (two) times daily.     . Omega-3 Fatty Acids (FISH OIL) 1000 MG CPDR Take 1,000 mg by mouth daily.    Marland Kitchen omeprazole (PRILOSEC) 40 MG capsule Take 40 mg by mouth daily.    . simvastatin (ZOCOR) 20 MG tablet Take 20 mg by mouth daily.    Marland Kitchen VITAMIN D, CHOLECALCIFEROL, PO Take 1 tablet by mouth daily.      Objective: BP (!) 149/98   Pulse 73   Temp 98 F (36.7 C) (Oral)   Resp 14   SpO2 97%  Exam: General: Alert, NAD, laying with cold compress over right eye HEENT: MMM, minimal movement in right eye due to pain, visual field intact to counting fingers at 1-2 feet away.  Significant ecchymoses and swelling around right eye. Right conjunctiva is red. No hyphema. Pupils were round, no peaked pupil. Asymmetry of right upper lip seems to be related to soft tissue swelling of the right side of the face and not due to weakness. Cardiac: RRR faint heart sounds, faint systolic murmur Lungs: Clear bilaterally, no increased  WOB on RA satting well Abdomen: soft, non-tender, non-distended, normoactive BS Msk: Moves all extremities spontaneously, no tenderness with palpation to bony structures of bilateral arms, clavicles, lower extremities. Ext: Warm, dry, 2+ distal pulses, 1+ pitting edema to bilateral lower extremity Neuro: Alert and oriented to person, place but not time. States that it is Spring time. Speech appropriate, able to answer questions appropriately.  Follows commands.  PERRL. CN II-XII intact with the exception of movement in her right eye given fracture.  5/5 muscle strength in bilateral upper and lower extremity.  Sensation intact bilaterally.  Derm: Ecchymoses and swelling around right IST as above, additional ecchymoses and scratches on right side of face.  No further bruising or scratches noted on remainder of body.   Labs and Imaging: CBC BMET  Recent Labs  Lab 12/22/17 1444  WBC 5.0  HGB 13.3  HCT 41.2  PLT 249   Recent Labs  Lab 12/22/17 1444  NA 138  K 3.9  CL 102  CO2 25  BUN 9  CREATININE 0.85  GLUCOSE 135*  CALCIUM 9.3     Ct Head Wo Contrast  Result Date: 12/22/2017 CLINICAL DATA:  Recent fall with facial pain, initial encounter EXAM: CT HEAD WITHOUT CONTRAST CT MAXILLOFACIAL WITHOUT CONTRAST CT CERVICAL SPINE WITHOUT CONTRAST TECHNIQUE: Multidetector CT imaging of the head, cervical spine, and maxillofacial structures were performed using the standard protocol without intravenous contrast. Multiplanar CT image reconstructions of the cervical spine and maxillofacial structures were also generated. COMPARISON:  11/05/2009 MRI of the lumbar spine. FINDINGS: CT HEAD FINDINGS Brain: Mild atrophic changes are noted commensurate with the patient's given age. No findings to suggest acute hemorrhage, acute infarct or space-occupying mass lesion are noted. Scattered hypodensities are noted consistent with chronic white matter ischemic change. Vascular: No hyperdense vessel or unexpected  calcification. Skull: No acute fracture is identified. Other: Soft tissue swelling is noted over  the right eye consistent with the recent injury. Air-fluid level is noted within the right maxillary antrum as well as fracture through the inferior orbital wall. CT MAXILLOFACIAL FINDINGS Osseous: There are changes consistent with a blowout fracture involving the right orbit with downward depression of the inferior orbital wall identified. No muscular entrapment is noted. Air-fluid level is noted within the right maxillary antrum with evidence of hemorrhage consistent with the recent injury. No other fracture is seen. Orbits: The globes are unremarkable bilaterally. Some air is noted within the right orbit posteriorly related to communication with the right maxillary antrum secondary to the known fracture. Right periorbital soft tissue swelling is seen. Sinuses: Air-fluid level with hemorrhage is noted within the right maxillary antrum. Some mucosal thickening is noted within the nasal passages as well as within the ethmoid air cells on the right. No other focal air-fluid level is seen. Soft tissues: Considerable soft tissue swelling is noted over the right orbit consistent with the recent injury. CT CERVICAL SPINE FINDINGS Alignment: There is loss of the normal cervical lordosis. Mild anterolisthesis of C2 on C3 and C3 on C4 as well as C4 on C5 is noted related to facet hypertrophic changes. Mild retrolisthesis of C5 with respect to C4 and C6 is seen but stable from a prior MRI examination of 2011. Skull base and vertebrae: Vertebral body height is well maintained. Multilevel facet hypertrophic changes are noted as well as osteophytic changes which contribute to central canal stenosis. The retrolisthesis of C5 with respect to C6 also contributes to central canal stenosis as well as neural foraminal narrowing. Multilevel neural foraminal narrowing is seen related to the facet hypertrophic changes. No acute fracture or  acute facet abnormality is noted. Soft tissues and spinal canal: No significant spinal cord compression is noted. Surrounding soft tissues are within normal limits. Upper chest: Within normal limits. Other: None IMPRESSION: CT of the head: Mild atrophic and chronic white matter ischemic changes commensurate with the patient's given age. CT of the maxillofacial bones: Considerable soft tissue swelling is noted over the right orbit with evidence of fracture through the inferior orbital wall with downward displacement of the fracture fragment. No muscular entrapment is noted. Associated air-fluid level with hemorrhage is noted within the right maxillary antrum. No other fractures are seen. CT of the cervical spine: No acute abnormality noted. Multilevel degenerative change which has progressed from 2011. Stable retrolisthesis of C5 with respect to C4 and C6 is noted. Electronically Signed   By: Inez Catalina M.D.   On: 12/22/2017 15:19   Ct Cervical Spine Wo Contrast  Result Date: 12/22/2017 CLINICAL DATA:  Recent fall with facial pain, initial encounter EXAM: CT HEAD WITHOUT CONTRAST CT MAXILLOFACIAL WITHOUT CONTRAST CT CERVICAL SPINE WITHOUT CONTRAST TECHNIQUE: Multidetector CT imaging of the head, cervical spine, and maxillofacial structures were performed using the standard protocol without intravenous contrast. Multiplanar CT image reconstructions of the cervical spine and maxillofacial structures were also generated. COMPARISON:  11/05/2009 MRI of the lumbar spine. FINDINGS: CT HEAD FINDINGS Brain: Mild atrophic changes are noted commensurate with the patient's given age. No findings to suggest acute hemorrhage, acute infarct or space-occupying mass lesion are noted. Scattered hypodensities are noted consistent with chronic white matter ischemic change. Vascular: No hyperdense vessel or unexpected calcification. Skull: No acute fracture is identified. Other: Soft tissue swelling is noted over the right eye  consistent with the recent injury. Air-fluid level is noted within the right maxillary antrum as well as fracture through the  inferior orbital wall. CT MAXILLOFACIAL FINDINGS Osseous: There are changes consistent with a blowout fracture involving the right orbit with downward depression of the inferior orbital wall identified. No muscular entrapment is noted. Air-fluid level is noted within the right maxillary antrum with evidence of hemorrhage consistent with the recent injury. No other fracture is seen. Orbits: The globes are unremarkable bilaterally. Some air is noted within the right orbit posteriorly related to communication with the right maxillary antrum secondary to the known fracture. Right periorbital soft tissue swelling is seen. Sinuses: Air-fluid level with hemorrhage is noted within the right maxillary antrum. Some mucosal thickening is noted within the nasal passages as well as within the ethmoid air cells on the right. No other focal air-fluid level is seen. Soft tissues: Considerable soft tissue swelling is noted over the right orbit consistent with the recent injury. CT CERVICAL SPINE FINDINGS Alignment: There is loss of the normal cervical lordosis. Mild anterolisthesis of C2 on C3 and C3 on C4 as well as C4 on C5 is noted related to facet hypertrophic changes. Mild retrolisthesis of C5 with respect to C4 and C6 is seen but stable from a prior MRI examination of 2011. Skull base and vertebrae: Vertebral body height is well maintained. Multilevel facet hypertrophic changes are noted as well as osteophytic changes which contribute to central canal stenosis. The retrolisthesis of C5 with respect to C6 also contributes to central canal stenosis as well as neural foraminal narrowing. Multilevel neural foraminal narrowing is seen related to the facet hypertrophic changes. No acute fracture or acute facet abnormality is noted. Soft tissues and spinal canal: No significant spinal cord compression is  noted. Surrounding soft tissues are within normal limits. Upper chest: Within normal limits. Other: None IMPRESSION: CT of the head: Mild atrophic and chronic white matter ischemic changes commensurate with the patient's given age. CT of the maxillofacial bones: Considerable soft tissue swelling is noted over the right orbit with evidence of fracture through the inferior orbital wall with downward displacement of the fracture fragment. No muscular entrapment is noted. Associated air-fluid level with hemorrhage is noted within the right maxillary antrum. No other fractures are seen. CT of the cervical spine: No acute abnormality noted. Multilevel degenerative change which has progressed from 2011. Stable retrolisthesis of C5 with respect to C4 and C6 is noted. Electronically Signed   By: Inez Catalina M.D.   On: 12/22/2017 15:19   Dg Chest Portable 1 View  Result Date: 12/22/2017 CLINICAL DATA:  84 year old female status post trip and fall today. Syncope. EXAM: PORTABLE CHEST 1 VIEW COMPARISON:  Chest radiographs 03/13/2011. FINDINGS: Portable AP semi upright view at 1616 hours. Mediastinal contours are stable and within normal limits. Stable lung volumes. Allowing for portable technique the lungs are clear. Visualized tracheal air column is within normal limits. Osteopenia. No acute osseous abnormality identified. IMPRESSION: No acute cardiopulmonary abnormality or acute traumatic injury identified. Electronically Signed   By: Genevie Pallie M.D.   On: 12/22/2017 16:29   Ct Maxillofacial Wo Cm  Result Date: 12/22/2017 CLINICAL DATA:  Recent fall with facial pain, initial encounter EXAM: CT HEAD WITHOUT CONTRAST CT MAXILLOFACIAL WITHOUT CONTRAST CT CERVICAL SPINE WITHOUT CONTRAST TECHNIQUE: Multidetector CT imaging of the head, cervical spine, and maxillofacial structures were performed using the standard protocol without intravenous contrast. Multiplanar CT image reconstructions of the cervical spine and  maxillofacial structures were also generated. COMPARISON:  11/05/2009 MRI of the lumbar spine. FINDINGS: CT HEAD FINDINGS Brain: Mild atrophic changes are  noted commensurate with the patient's given age. No findings to suggest acute hemorrhage, acute infarct or space-occupying mass lesion are noted. Scattered hypodensities are noted consistent with chronic white matter ischemic change. Vascular: No hyperdense vessel or unexpected calcification. Skull: No acute fracture is identified. Other: Soft tissue swelling is noted over the right eye consistent with the recent injury. Air-fluid level is noted within the right maxillary antrum as well as fracture through the inferior orbital wall. CT MAXILLOFACIAL FINDINGS Osseous: There are changes consistent with a blowout fracture involving the right orbit with downward depression of the inferior orbital wall identified. No muscular entrapment is noted. Air-fluid level is noted within the right maxillary antrum with evidence of hemorrhage consistent with the recent injury. No other fracture is seen. Orbits: The globes are unremarkable bilaterally. Some air is noted within the right orbit posteriorly related to communication with the right maxillary antrum secondary to the known fracture. Right periorbital soft tissue swelling is seen. Sinuses: Air-fluid level with hemorrhage is noted within the right maxillary antrum. Some mucosal thickening is noted within the nasal passages as well as within the ethmoid air cells on the right. No other focal air-fluid level is seen. Soft tissues: Considerable soft tissue swelling is noted over the right orbit consistent with the recent injury. CT CERVICAL SPINE FINDINGS Alignment: There is loss of the normal cervical lordosis. Mild anterolisthesis of C2 on C3 and C3 on C4 as well as C4 on C5 is noted related to facet hypertrophic changes. Mild retrolisthesis of C5 with respect to C4 and C6 is seen but stable from a prior MRI examination of  2011. Skull base and vertebrae: Vertebral body height is well maintained. Multilevel facet hypertrophic changes are noted as well as osteophytic changes which contribute to central canal stenosis. The retrolisthesis of C5 with respect to C6 also contributes to central canal stenosis as well as neural foraminal narrowing. Multilevel neural foraminal narrowing is seen related to the facet hypertrophic changes. No acute fracture or acute facet abnormality is noted. Soft tissues and spinal canal: No significant spinal cord compression is noted. Surrounding soft tissues are within normal limits. Upper chest: Within normal limits. Other: None IMPRESSION: CT of the head: Mild atrophic and chronic white matter ischemic changes commensurate with the patient's given age. CT of the maxillofacial bones: Considerable soft tissue swelling is noted over the right orbit with evidence of fracture through the inferior orbital wall with downward displacement of the fracture fragment. No muscular entrapment is noted. Associated air-fluid level with hemorrhage is noted within the right maxillary antrum. No other fractures are seen. CT of the cervical spine: No acute abnormality noted. Multilevel degenerative change which has progressed from 2011. Stable retrolisthesis of C5 with respect to C4 and C6 is noted. Electronically Signed   By: Inez Catalina M.D.   On: 12/22/2017 15:19    Patriciaann Clan, DO 12/22/2017, 5:22 PM PGY-1, [facility] pager: [phone], text pages welcome  I have separately seen and examined the patient. I have discussed the findings and exam with Dr. Higinio Plan and agree with the above note.  My changes/additions are outlined in BLUE.   Everrett Coombe, MD PGY-3 [facility]

## 2017-12-22 NOTE — ED Notes (Signed)
Pt back from CT. Pt feeling nauseated. Pure-wick applied. Family at bedside.

## 2017-12-22 NOTE — Progress Notes (Signed)
ED report received at 1910 and pt arrived to the unit at 2115 from MRI. Pt alert and verbally responsive; right eye has abrasion, bruising and edematous. Pt oriented to the unit and room; fall/safety precaution and prevention education completed with pt and family at bedside. Telemetry applied and verified with CCMD; NT called to second verify; pt skin clean, dry and intact with no pressure ulcers or opened wounds except for right side facial trauma. Skin assessment completed with second RN per protocol. VSS; bed alarm on; ice applied to right face; reported off to nurse Estill Bamberg. Delia Heady RN   12/22/17 2116  Vitals  Temp 98.5 F (36.9 C)  Temp Source Oral  BP (!) 130/58  MAP (mmHg) 79  BP Location Right Arm  BP Method Automatic  Patient Position (if appropriate) Lying  Pulse Rate 71  Pulse Rate Source Monitor  Resp 16  Oxygen Therapy  SpO2 97 %  O2 Device Room Air  Height and Weight  Height 5' (1.524 m)  Weight 106.3 kg  Type of Scale Used Bed  Type of Weight Actual  BSA (Calculated - sq m) 2.12 sq meters  BMI (Calculated) 45.77  Weight in (lb) to have BMI = 25 127.7

## 2017-12-22 NOTE — ED Notes (Signed)
Pt transported to MRI 

## 2017-12-23 ENCOUNTER — Encounter (HOSPITAL_COMMUNITY): Payer: Self-pay | Admitting: General Practice

## 2017-12-23 ENCOUNTER — Inpatient Hospital Stay (HOSPITAL_COMMUNITY): Payer: Medicare Other

## 2017-12-23 DIAGNOSIS — R55 Syncope and collapse: Secondary | ICD-10-CM

## 2017-12-23 DIAGNOSIS — S0285XA Fracture of orbit, unspecified, initial encounter for closed fracture: Secondary | ICD-10-CM

## 2017-12-23 DIAGNOSIS — S0232XA Fracture of orbital floor, left side, initial encounter for closed fracture: Secondary | ICD-10-CM

## 2017-12-23 DIAGNOSIS — S060X9A Concussion with loss of consciousness of unspecified duration, initial encounter: Secondary | ICD-10-CM

## 2017-12-23 LAB — GLUCOSE, CAPILLARY: Glucose-Capillary: 125 mg/dL — ABNORMAL HIGH (ref 70–99)

## 2017-12-23 LAB — ECHOCARDIOGRAM COMPLETE
Height: 60 in
Weight: 3746.06 oz

## 2017-12-23 MED ORDER — ENOXAPARIN SODIUM 40 MG/0.4ML ~~LOC~~ SOLN
40.0000 mg | SUBCUTANEOUS | Status: DC
  Administered 2017-12-23 – 2017-12-24 (×2): 40 mg via SUBCUTANEOUS
  Filled 2017-12-23 (×2): qty 0.4

## 2017-12-23 MED ORDER — MAGNESIUM SULFATE 2 GM/50ML IV SOLN
2.0000 g | Freq: Once | INTRAVENOUS | Status: AC
  Administered 2017-12-23: 2 g via INTRAVENOUS
  Filled 2017-12-23: qty 50

## 2017-12-23 MED ORDER — KETOROLAC TROMETHAMINE 30 MG/ML IJ SOLN
30.0000 mg | Freq: Once | INTRAMUSCULAR | Status: AC
  Administered 2017-12-23: 30 mg via INTRAVENOUS
  Filled 2017-12-23: qty 1

## 2017-12-23 MED ORDER — METOPROLOL TARTRATE 12.5 MG HALF TABLET: 12.5000 mg | ORAL_TABLET | Freq: Two times a day (BID) | ORAL | Status: DC

## 2017-12-23 NOTE — Progress Notes (Addendum)
Occupational Therapy Evaluation Patient Details Name: Sydney Booth MRN: [id number] DOB: Jan 07, 1931 Today's Date: 12/23/2017    History of Present Illness 84 y.o. female presenting after a sudden fall, found to have a left sided orbital fracture and presenting for further syncopal work-up. PMH is significant for hypertension, hyperlipidemia, left knee DJD, and "leaky" heart valve.   Clinical Impression   PTA, pt was independent and lived with her daughter/grand daughter, who work during the day. Pt currently requires min gauard A @ RW level with functional mobility and overall min A with LB ADL. Pt endorses complaints of diplopia due to R eye deficits/edema. Pt most likely post-concussive. Family states pt recently started medication for anxiety/depression. At this time, recommend follow up with [facility]. Recommend vestibular eval.    Pt not orthostatic during session but does complain of "dizziness". states she has "nosedrops that she uses for dizziness".  Orthostatic BPs  Supine 136/76  Sitting 156/57     Standing   Standing after 1 min 151/82     Follow Up Recommendations  Home health OT;Supervision/Assistance - 24 hour    Equipment Recommendations  None recommended by OT    Recommendations for Other Services  Vestibular Eval     Precautions / Restrictions Precautions Precautions: Fall Restrictions Weight Bearing Restrictions: No      Mobility Bed Mobility Overal bed mobility: Needs Assistance Bed Mobility: Supine to Sit     Supine to sit: Supervision        Transfers Overall transfer level: Needs assistance   Transfers: Sit to/from Stand Sit to Stand: Min guard         General transfer comment: initially complains of dizziness    Balance Overall balance assessment: Needs assistance   Sitting balance-Leahy Scale: Good       Standing balance-Leahy Scale: Fair                             ADL either performed or assessed with clinical  judgement   ADL Overall ADL's : Needs assistance/impaired     Grooming: Set up;Sitting   Upper Body Bathing: Set up;Sitting   Lower Body Bathing: Minimal assistance;Sit to/from stand   Upper Body Dressing : Minimal assistance;Sitting   Lower Body Dressing: Moderate assistance;Sit to/from stand   Toilet Transfer: Minimal assistance(without AD; pt feels more comfortable with RW)   Toileting- Clothing Manipulation and Hygiene: Min guard       Functional mobility during ADLs: Min guard;Rolling walker;Cueing for safety       Vision Baseline Vision/History: Wears glasses Wears Glasses: At all times Patient Visual Report: Blurring of vision;Diplopia Vision Assessment?: Vision impaired- to be further tested in functional context Additional Comments: R eye with apparent edema and difficulty with full ocular ROM     Perception     Praxis      Pertinent Vitals/Pain Pain Assessment: 0-10 Pain Score: 5  Pain Location: R eye Pain Descriptors / Indicators: Aching;Discomfort Pain Intervention(s): Limited activity within patient's tolerance     Hand Dominance Right   Extremity/Trunk Assessment Upper Extremity Assessment Upper Extremity Assessment: Overall WFL for tasks assessed   Lower Extremity Assessment Lower Extremity Assessment: Defer to PT evaluation   Cervical / Trunk Assessment Cervical / Trunk Assessment: Normal   Communication Communication Communication: No difficulties   Cognition Arousal/Alertness: Awake/alert Behavior During Therapy: WFL for tasks assessed/performed;Anxious(anxious at times) Overall Cognitive Status: Impaired/Different from baseline Area of Impairment: Attention;Memory  Current Attention Level: Selective Memory: Decreased short-term memory         General Comments: Pt does not remember incident or tasks around time of incident; pt oriented this am; following commands; slow processing inititially; Began to  educate grand daughter on post - concussive syndrome. will provide handout   General Comments       Exercises     Shoulder Instructions      Home Living Family/patient expects to be discharged to:: Private residence Living Arrangements: Children;Other relatives Available Help at Discharge: Family;Available 24 hours/day Type of Home: House Home Access: Stairs to enter [employer] of Steps: 1 Entrance Stairs-Rails: None Home Layout: Two level;Able to live on main level with bedroom/bathroom     Bathroom Shower/Tub: Occupational psychologist: Standard Bathroom Accessibility: Yes How Accessible: Accessible via walker Home Equipment: [place] - 2 wheels;Cane - single point;Bedside commode;Shower seat;Shower seat - built in;Grab bars - tub/shower   Additional Comments: Family will be available      Prior Functioning/Environment Level of Independence: Independent                 OT Problem List: Decreased activity tolerance;Impaired balance (sitting and/or standing);Impaired vision/perception;Decreased cognition;Decreased safety awareness;Decreased knowledge of use of DME or AE;Obesity;Pain      OT Treatment/Interventions: Self-care/ADL training;DME and/or AE instruction;Therapeutic activities;Cognitive remediation/compensation;Visual/perceptual remediation/compensation;Patient/family education;Balance training    OT Goals(Current goals can be found in the care plan section) Acute Rehab OT Goals Patient Stated Goal: "to find out what happened" OT Goal Formulation: With patient/family Time For Goal Achievement: 01/20/18 Potential to Achieve Goals: Good  OT Frequency: Min 2X/week   Barriers to D/C:            Co-evaluation              AM-PAC OT "6 Clicks" Daily Activity     Outcome Measure Help from another person eating meals?: None Help from another person taking care of personal grooming?: A Little Help from another person toileting,  which includes using toliet, bedpan, or urinal?: A Little Help from another person bathing (including washing, rinsing, drying)?: A Little Help from another person to put on and taking off regular upper body clothing?: A Little Help from another person to put on and taking off regular lower body clothing?: A Lot 6 Click Score: 18   End of Session Equipment Utilized During Treatment: Gait belt;Rolling walker Nurse Communication: Mobility status  Activity Tolerance: Patient tolerated treatment well Patient left: Other (comment)(wiht PT)  OT Visit Diagnosis: Unsteadiness on feet (R26.81);Muscle weakness (generalized) (M62.81);Other symptoms and signs involving cognitive function;Pain(diplopia) Pain - Right/Left: Right Pain - part of body: (eye)                Time: 1937-9024 OT Time Calculation (min): 21 min Charges:  OT General Charges $OT Visit: 1 Visit OT Evaluation $OT Eval Moderate Complexity: [place], OT/L   Acute OT Clinical Specialist [place] Pager [phone] Office [phone]   [facility] 12/23/2017, 11:45 AM

## 2017-12-23 NOTE — Care Management Note (Signed)
Case Management Note  Patient Details  Name: Sydney Booth MRN: [id number] Date of Birth: 12/07/1931  Subjective/Objective:     Pt admitted with a fall resulting in orbit fracture. She is from home with her daughter and granddaughter. Pt states she is usually only alone at home from about 7 am to 12 pm.  DME: cane, walker, shower chair, 3 in 1 Denies issues obtaining her medications. Pt's daughter and granddaughter provide transportation.               Action/Plan: PT/OT recommending [place] services. CM provided her choice. She is going to review it with her daughter tonight and will let me know choice in am. MD please place orders for [facility] and [facility]. Currently no recommendations for new DME. CM following.   Expected Discharge Date:                  Expected Discharge Plan:  [place]  In-House Referral:     Discharge planning Services  CM Consult  Post Acute Care Choice:  Home Health Choice offered to:     DME Arranged:    DME Agency:     HH Arranged:    [place] Agency:     Status of Service:  In process, will continue to follow  If discussed at Long Length of Stay Meetings, dates discussed:    Additional Comments:  Pollie Friar, RN 12/23/2017, 3:06 PM

## 2017-12-23 NOTE — Evaluation (Signed)
Physical Therapy Evaluation Patient Details Name: Sydney Booth MRN: [id number] DOB: [birth year] Today's Date: 12/23/2017   History of Present Illness  Pt is an 84 y.o. female presenting after a sudden fall, found to have a left sided orbital fracture and presenting for further syncopal work-up. PMH is significant for hypertension, hyperlipidemia, left knee DJD, and "leaky" heart valve.    Clinical Impression  Pt presented sitting EOB with OT and granddaughter present. Prior to admission, pt reported that she was independent with functional mobility and ADLs. Pt lives with her daughter and granddaughter who can provide as much supervision/assistance as needed upon d/c. Pt with reported dizziness consistently throughout that was no better or worse with activity. Pt's BP was assessed in sitting and standing, which remained stable (156/57 and 151/82 respectively). Pt with mild instability with ambulation using RW with min guard for safety; however, no overt LOB. Pt would continue to benefit from skilled physical therapy services at this time while admitted and after d/c to address the below listed limitations in order to improve overall safety and independence with functional mobility.     Follow Up Recommendations Home health PT;Supervision/Assistance - 24 hour    Equipment Recommendations  None recommended by PT    Recommendations for Other Services       Precautions / Restrictions Precautions Precautions: Fall Restrictions Weight Bearing Restrictions: No      Mobility  Bed Mobility Overal bed mobility: Needs Assistance Bed Mobility: Supine to Sit     Supine to sit: Supervision     General bed mobility comments: pt seated EOB upon arrival  Transfers Overall transfer level: Needs assistance Equipment used: None Transfers: Sit to/from Stand Sit to Stand: Min guard         General transfer comment: min guard for safety, +dizziness initially  Ambulation/Gait Ambulation/Gait  assistance: Min guard Gait Distance (Feet): 75 Feet Assistive device: Rolling walker (2 wheeled) Gait Pattern/deviations: Step-through pattern;Decreased stride length Gait velocity: decreased   General Gait Details: pt slow, cautious and guarded with ambulation using RW; min guard for safety  Stairs            Wheelchair Mobility    Modified Rankin (Stroke Patients Only)       Balance Overall balance assessment: Needs assistance Sitting-balance support: Feet supported Sitting balance-Leahy Scale: Good     Standing balance support: Bilateral upper extremity supported Standing balance-Leahy Scale: Poor                               Pertinent Vitals/Pain Pain Assessment: 0-10 Pain Score: 5  Pain Location: R eye Pain Descriptors / Indicators: Aching;Discomfort Pain Intervention(s): Monitored during session;Repositioned    Home Living Family/patient expects to be discharged to:: Private residence Living Arrangements: Children;Other relatives Available Help at Discharge: Family;Available 24 hours/day Type of Home: House Home Access: Stairs to enter Entrance Stairs-Rails: None Entrance Stairs-Number of Steps: 1 Home Layout: Two level;Able to live on main level with bedroom/bathroom Home Equipment: [place] - 2 wheels;Cane - single point;Bedside commode;Shower seat;Shower seat - built in;Grab bars - tub/shower Additional Comments: Family will be available as much as needed    Prior Function Level of Independence: Independent               Hand Dominance   Dominant Hand: Right    Extremity/Trunk Assessment   Upper Extremity Assessment Upper Extremity Assessment: Defer to OT evaluation;Overall [facility] for tasks assessed  Lower Extremity Assessment Lower Extremity Assessment: Overall WFL for tasks assessed    Cervical / Trunk Assessment Cervical / Trunk Assessment: Normal  Communication   Communication: No difficulties  Cognition  Arousal/Alertness: Awake/alert Behavior During Therapy: WFL for tasks assessed/performed;Anxious Overall Cognitive Status: Impaired/Different from baseline Area of Impairment: Attention;Memory                   Current Attention Level: Selective Memory: Decreased short-term memory         General Comments: Pt does not remember incident or tasks around time of incident; pt oriented this am; following commands; slow processing inititially; Began to educate grand daughter on post - concussive syndrome. will provide handout      General Comments      Exercises     Assessment/Plan    PT Assessment Patient needs continued PT services  PT Problem List Decreased strength;Decreased balance;Decreased mobility;Decreased activity tolerance;Decreased coordination;Decreased knowledge of use of DME;Decreased safety awareness;Decreased knowledge of precautions       PT Treatment Interventions DME instruction;Gait training;Functional mobility training;Therapeutic activities;Stair training;Therapeutic exercise;Balance training;Neuromuscular re-education;Patient/family education    PT Goals (Current goals can be found in the Care Plan section)  Acute Rehab PT Goals Patient Stated Goal: "to find out what happened" PT Goal Formulation: With patient Time For Goal Achievement: 01/06/18 Potential to Achieve Goals: Good    Frequency Min 3X/week   Barriers to discharge        Co-evaluation               AM-PAC PT "6 Clicks" Mobility  Outcome Measure Help needed turning from your back to your side while in a flat bed without using bedrails?: None Help needed moving from lying on your back to sitting on the side of a flat bed without using bedrails?: None Help needed moving to and from a bed to a chair (including a wheelchair)?: A Little Help needed standing up from a chair using your arms (e.g., wheelchair or bedside chair)?: None Help needed to walk in hospital room?: A  Little Help needed climbing 3-5 steps with a railing? : A Little 6 Click Score: 21    End of Session Equipment Utilized During Treatment: Gait belt Activity Tolerance: Patient tolerated treatment well Patient left: in chair;with call bell/phone within reach;with chair alarm set;with family/visitor present Nurse Communication: Mobility status PT Visit Diagnosis: Other abnormalities of gait and mobility (R26.89)    Time: 9169-4503 PT Time Calculation (min) (ACUTE ONLY): 21 min   Charges:   PT Evaluation $PT Eval Moderate Complexity: [place], PT, DPT  Acute Rehabilitation Services Pager [phone] Office [place] 12/23/2017, 12:30 PM

## 2017-12-23 NOTE — Consult Note (Signed)
Reason for Consult: Possible syncope Referring Physician: Family Medicine  Sydney Booth is an 84 y.o. female.  HPI:   84 y/o Caucasian female with hypertension, hyperlipidemia, peripheral neuropathy in bilateral feet, admitted with a sudden fall episode. Patient lives with her daughter and granddaughter. She was on her way to her chiropracter's appointment with her daughter, when she had sudden fall that led to right orbital fracture. While patient did not have loss of consciousness, she does have loss of memory about the events that occurred around the time of the fall. While she has been here, she is awake, does not have any chest pain, shortness of breath, palpitations.I have reviewed the chart regarding her daughter's account of events, but unfortunately was not able to reach her on the phone.   At baseline, patient's walking is limited due to peripheral neuropathy, reports feeling "numb" in her feet. She has been seen by neurology many years ago. She reports lightheadedness on getting up quickly, but denies any loss of consciousness.   Past Medical History:  Diagnosis Date  . Arthritis   . Blood transfusion   . Cervical vertebral fracture (HCC)    fued on its on  . Closed head injury    as a chlid  . Gastric ulcer   . GERD (gastroesophageal reflux disease)   . H/O hiatal hernia   . Headache(784.0)   . Hyperlipemia   . Hypertension   . Mitral valve prolapse   . Obesity   . OSA on CPAP   . Osteopenia   . Rotator cuff tear    Left  . Seasonal allergies   . Shortness of breath    With activity  . Ulcer 1991   [place]     Past Surgical History:  Procedure Laterality Date  . BIospy     . Biospy      Uterus wall  . Carparal Tunnel    . CATARACT EXTRACTION    . Colonscopy    . EYE SURGERY     Right 2011, Left 2008  . LIPOMA EXCISION     FROM BACK   . THUMB FUSION    . TOTAL KNEE ARTHROPLASTY  03/24/2011   Procedure: TOTAL KNEE ARTHROPLASTY;  Surgeon: Hessie Dibble,  MD;  Location: [facility];  Service: Orthopedics;  Laterality: Left;  with revision tibial component  . TUBAL LIGATION      Family History  Problem Relation Age of Onset  . CVA Mother   . Cancer Father        COLON, LIVER AND PANCREAS  . Sleep apnea Brother   . Pulmonary embolism Brother   . Anesthesia problems Neg Hx   . Breast cancer Neg Hx     Social History:  reports that she has never smoked. She has never used smokeless tobacco. She reports that she does not drink alcohol or use drugs.  Allergies:  Allergies  Allergen Reactions  . Sulfa Antibiotics Hives  . Zocor [Simvastatin]     Muscle aches  . Food     Milk  Dariey products    Medications: I have reviewed the patient's current medications.  Results for orders placed or performed during the hospital encounter of 12/22/17 (from the past 48 hour(s))  CBG monitoring, ED     Status: Abnormal   Collection Time: 12/22/17  2:38 PM  Result Value Ref Range   Glucose-Capillary 117 (H) 70 - 99 mg/dL  Urinalysis, Routine w reflex microscopic  Status: Abnormal   Collection Time: 12/22/17  2:44 PM  Result Value Ref Range   Color, Urine STRAW (A) YELLOW   APPearance CLEAR CLEAR   Specific Gravity, Urine 1.010 1.005 - 1.030   pH 8.0 5.0 - 8.0   Glucose, UA NEGATIVE NEGATIVE mg/dL   Hgb urine dipstick SMALL (A) NEGATIVE   Bilirubin Urine NEGATIVE NEGATIVE   Ketones, ur NEGATIVE NEGATIVE mg/dL   Protein, ur NEGATIVE NEGATIVE mg/dL   Nitrite NEGATIVE NEGATIVE   Leukocytes, UA NEGATIVE NEGATIVE   RBC / HPF 0-5 0 - 5 RBC/hpf   WBC, UA 0-5 0 - 5 WBC/hpf   Bacteria, UA NONE SEEN NONE SEEN    Comment: Performed at [facility] [address], [place], [place] [address]  CBC with Differential     Status: None   Collection Time: 12/22/17  2:44 PM  Result Value Ref Range   WBC 5.0 4.0 - 10.5 K/uL   RBC 4.49 3.87 - 5.11 MIL/uL   Hemoglobin 13.3 12.0 - 15.0 g/dL   HCT 41.2 36.0 - 46.0 %   MCV 91.8 80.0 - 100.0 fL   MCH  29.6 26.0 - 34.0 pg   MCHC 32.3 30.0 - 36.0 g/dL   RDW 12.2 11.5 - 15.5 %   Platelets 249 150 - 400 K/uL   nRBC 0.0 0.0 - 0.2 %   Neutrophils Relative % 58 %   Neutro Abs 2.9 1.7 - 7.7 K/uL   Lymphocytes Relative 29 %   Lymphs Abs 1.5 0.7 - 4.0 K/uL   Monocytes Relative 10 %   Monocytes Absolute 0.5 0.1 - 1.0 K/uL   Eosinophils Relative 2 %   Eosinophils Absolute 0.1 0.0 - 0.5 K/uL   Basophils Relative 1 %   Basophils Absolute 0.0 0.0 - 0.1 K/uL   Immature Granulocytes 0 %   Abs Immature Granulocytes 0.01 0.00 - 0.07 K/uL    Comment: Performed at [facility], [address], [place], [place] [address]  Comprehensive metabolic panel     Status: Abnormal   Collection Time: 12/22/17  2:44 PM  Result Value Ref Range   Sodium 138 135 - 145 mmol/L   Potassium 3.9 3.5 - 5.1 mmol/L   Chloride 102 98 - 111 mmol/L   CO2 25 22 - 32 mmol/L   Glucose, Bld 135 (H) 70 - 99 mg/dL   BUN 9 8 - 23 mg/dL   Creatinine, Ser 0.85 0.44 - 1.00 mg/dL   Calcium 9.3 8.9 - 10.3 mg/dL   Total Protein 6.7 6.5 - 8.1 g/dL   Albumin 3.5 3.5 - 5.0 g/dL   AST 23 15 - 41 U/L   ALT 13 0 - 44 U/L   Alkaline Phosphatase 77 38 - 126 U/L   Total Bilirubin 0.6 0.3 - 1.2 mg/dL   GFR calc non Af Amer >60 >60 mL/min   GFR calc Af Amer >60 >60 mL/min   Anion gap 11 5 - 15    Comment: Performed at [facility] [address] [place], [place] [address]  Lipase, blood     Status: None   Collection Time: 12/22/17  2:44 PM  Result Value Ref Range   Lipase 33 11 - 51 U/L    Comment: Performed at [facility], [place] [address] [place], [place] [address]  I-stat troponin, ED     Status: None   Collection Time: 12/22/17  2:50 PM  Result Value Ref  Range   Troponin i, poc 0.01 0.00 - 0.08 ng/mL   Comment 3            Comment: Due to the release kinetics of cTnI, a negative result within the first hours of the onset of symptoms does not rule out myocardial infarction with certainty. If myocardial  infarction is still suspected, repeat the test at appropriate intervals.   Magnesium     Status: Abnormal   Collection Time: 12/22/17  6:17 PM  Result Value Ref Range   Magnesium 1.5 (L) 1.7 - 2.4 mg/dL    Comment: Performed at [facility] [address], [place], [place] [address]  Phosphorus     Status: None   Collection Time: 12/22/17  6:17 PM  Result Value Ref Range   Phosphorus 3.0 2.5 - 4.6 mg/dL    Comment: Performed at [facility] [address], [place], [place] [address]  Troponin I - Once     Status: None   Collection Time: 12/22/17  6:17 PM  Result Value Ref Range   Troponin I <0.03 <0.03 ng/mL    Comment: Performed at [facility] [address] [place], [place] [address]  Glucose, capillary     Status: Abnormal   Collection Time: 12/23/17  5:46 AM  Result Value Ref Range   Glucose-Capillary 125 (H) 70 - 99 mg/dL   Comment 1 Notify RN    Comment 2 Document in Chart     Ct Head Wo Contrast  Result Date: 12/22/2017 CLINICAL DATA:  Recent fall with facial pain, initial encounter EXAM: CT HEAD WITHOUT CONTRAST CT MAXILLOFACIAL WITHOUT CONTRAST CT CERVICAL SPINE WITHOUT CONTRAST TECHNIQUE: Multidetector CT imaging of the head, cervical spine, and maxillofacial structures were performed using the standard protocol without intravenous contrast. Multiplanar CT image reconstructions of the cervical spine and maxillofacial structures were also generated. COMPARISON:  11/05/2009 MRI of the lumbar spine. FINDINGS: CT HEAD FINDINGS Brain: Mild atrophic changes are noted commensurate with the patient's given age. No findings to suggest acute hemorrhage, acute infarct or space-occupying mass lesion are noted. Scattered hypodensities are noted consistent with chronic white matter ischemic change. Vascular: No hyperdense vessel or unexpected calcification. Skull: No acute fracture is identified. Other: Soft tissue swelling is noted over the right eye consistent with  the recent injury. Air-fluid level is noted within the right maxillary antrum as well as fracture through the inferior orbital wall. CT MAXILLOFACIAL FINDINGS Osseous: There are changes consistent with a blowout fracture involving the right orbit with downward depression of the inferior orbital wall identified. No muscular entrapment is noted. Air-fluid level is noted within the right maxillary antrum with evidence of hemorrhage consistent with the recent injury. No other fracture is seen. Orbits: The globes are unremarkable bilaterally. Some air is noted within the right orbit posteriorly related to communication with the right maxillary antrum secondary to the known fracture. Right periorbital soft tissue swelling is seen. Sinuses: Air-fluid level with hemorrhage is noted within the right maxillary antrum. Some mucosal thickening is noted within the nasal passages as well as within the ethmoid air cells on the right. No other focal air-fluid level is seen. Soft tissues: Considerable soft tissue swelling is noted over the right orbit consistent with the recent injury. CT CERVICAL SPINE FINDINGS Alignment: There is loss of the normal cervical lordosis. Mild anterolisthesis of C2 on C3 and C3 on C4 as well as C4 on C5 is noted related to facet hypertrophic  changes. Mild retrolisthesis of C5 with respect to C4 and C6 is seen but stable from a prior MRI examination of 2011. Skull base and vertebrae: Vertebral body height is well maintained. Multilevel facet hypertrophic changes are noted as well as osteophytic changes which contribute to central canal stenosis. The retrolisthesis of C5 with respect to C6 also contributes to central canal stenosis as well as neural foraminal narrowing. Multilevel neural foraminal narrowing is seen related to the facet hypertrophic changes. No acute fracture or acute facet abnormality is noted. Soft tissues and spinal canal: No significant spinal cord compression is noted. Surrounding  soft tissues are within normal limits. Upper chest: Within normal limits. Other: None IMPRESSION: CT of the head: Mild atrophic and chronic white matter ischemic changes commensurate with the patient's given age. CT of the maxillofacial bones: Considerable soft tissue swelling is noted over the right orbit with evidence of fracture through the inferior orbital wall with downward displacement of the fracture fragment. No muscular entrapment is noted. Associated air-fluid level with hemorrhage is noted within the right maxillary antrum. No other fractures are seen. CT of the cervical spine: No acute abnormality noted. Multilevel degenerative change which has progressed from 2011. Stable retrolisthesis of C5 with respect to C4 and C6 is noted. Electronically Signed   By: Inez Catalina M.D.   On: 12/22/2017 15:19   Ct Cervical Spine Wo Contrast  Result Date: 12/22/2017 CLINICAL DATA:  Recent fall with facial pain, initial encounter EXAM: CT HEAD WITHOUT CONTRAST CT MAXILLOFACIAL WITHOUT CONTRAST CT CERVICAL SPINE WITHOUT CONTRAST TECHNIQUE: Multidetector CT imaging of the head, cervical spine, and maxillofacial structures were performed using the standard protocol without intravenous contrast. Multiplanar CT image reconstructions of the cervical spine and maxillofacial structures were also generated. COMPARISON:  11/05/2009 MRI of the lumbar spine. FINDINGS: CT HEAD FINDINGS Brain: Mild atrophic changes are noted commensurate with the patient's given age. No findings to suggest acute hemorrhage, acute infarct or space-occupying mass lesion are noted. Scattered hypodensities are noted consistent with chronic white matter ischemic change. Vascular: No hyperdense vessel or unexpected calcification. Skull: No acute fracture is identified. Other: Soft tissue swelling is noted over the right eye consistent with the recent injury. Air-fluid level is noted within the right maxillary antrum as well as fracture through the  inferior orbital wall. CT MAXILLOFACIAL FINDINGS Osseous: There are changes consistent with a blowout fracture involving the right orbit with downward depression of the inferior orbital wall identified. No muscular entrapment is noted. Air-fluid level is noted within the right maxillary antrum with evidence of hemorrhage consistent with the recent injury. No other fracture is seen. Orbits: The globes are unremarkable bilaterally. Some air is noted within the right orbit posteriorly related to communication with the right maxillary antrum secondary to the known fracture. Right periorbital soft tissue swelling is seen. Sinuses: Air-fluid level with hemorrhage is noted within the right maxillary antrum. Some mucosal thickening is noted within the nasal passages as well as within the ethmoid air cells on the right. No other focal air-fluid level is seen. Soft tissues: Considerable soft tissue swelling is noted over the right orbit consistent with the recent injury. CT CERVICAL SPINE FINDINGS Alignment: There is loss of the normal cervical lordosis. Mild anterolisthesis of C2 on C3 and C3 on C4 as well as C4 on C5 is noted related to facet hypertrophic changes. Mild retrolisthesis of C5 with respect to C4 and C6 is seen but stable from a prior MRI examination of 2011. Skull  base and vertebrae: Vertebral body height is well maintained. Multilevel facet hypertrophic changes are noted as well as osteophytic changes which contribute to central canal stenosis. The retrolisthesis of C5 with respect to C6 also contributes to central canal stenosis as well as neural foraminal narrowing. Multilevel neural foraminal narrowing is seen related to the facet hypertrophic changes. No acute fracture or acute facet abnormality is noted. Soft tissues and spinal canal: No significant spinal cord compression is noted. Surrounding soft tissues are within normal limits. Upper chest: Within normal limits. Other: None IMPRESSION: CT of the  head: Mild atrophic and chronic white matter ischemic changes commensurate with the patient's given age. CT of the maxillofacial bones: Considerable soft tissue swelling is noted over the right orbit with evidence of fracture through the inferior orbital wall with downward displacement of the fracture fragment. No muscular entrapment is noted. Associated air-fluid level with hemorrhage is noted within the right maxillary antrum. No other fractures are seen. CT of the cervical spine: No acute abnormality noted. Multilevel degenerative change which has progressed from 2011. Stable retrolisthesis of C5 with respect to C4 and C6 is noted. Electronically Signed   By: Inez Catalina M.D.   On: 12/22/2017 15:19   Mr Brain Wo Contrast  Result Date: 12/22/2017 CLINICAL DATA:  84 y/o  F; status post fall. EXAM: MRI HEAD WITHOUT CONTRAST TECHNIQUE: Multiplanar, multiecho pulse sequences of the brain and surrounding structures were obtained without intravenous contrast. COMPARISON:  12/22/2017 CT head and maxillofacial CT. 04/14/2009 MRI head. FINDINGS: Brain: No acute infarction, hemorrhage, hydrocephalus, extra-axial collection or mass lesion. Mild progression nonspecific T2 FLAIR hyperintensities in subcortical and periventricular white matter are compatible with chronic microvascular ischemic changes for age. Mild progression of volume loss of the brain. Vascular: Normal flow voids. Skull and upper cervical spine: Normal marrow signal. Sinuses/Orbits: Right periorbital soft tissue edema. Right floor of orbit fracture with inferior displacement of fracture fragments as seen on prior maxillofacial CT. Blood fluid level within the right maxillary sinus. No abnormal signal of the mastoid air cells. Bilateral intra-ocular lens replacement. Other: None. IMPRESSION: 1. No acute intracranial abnormality. 2. Right periorbital edema, right floor of orbit fracture, and right maxillary sinus blood fluid level better characterized on  maxillofacial CT. 3. Mild progression of chronic microvascular ischemic changes and volume loss of the brain from 2011. Electronically Signed   By: Kristine Garbe M.D.   On: 12/22/2017 19:53   Dg Chest Portable 1 View  Result Date: 12/22/2017 CLINICAL DATA:  84 year old female status post trip and fall today. Syncope. EXAM: PORTABLE CHEST 1 VIEW COMPARISON:  Chest radiographs 03/13/2011. FINDINGS: Portable AP semi upright view at 1616 hours. Mediastinal contours are stable and within normal limits. Stable lung volumes. Allowing for portable technique the lungs are clear. Visualized tracheal air column is within normal limits. Osteopenia. No acute osseous abnormality identified. IMPRESSION: No acute cardiopulmonary abnormality or acute traumatic injury identified. Electronically Signed   By: Genevie Marieelena M.D.   On: 12/22/2017 16:29   Ct Maxillofacial Wo Cm  Result Date: 12/22/2017 CLINICAL DATA:  Recent fall with facial pain, initial encounter EXAM: CT HEAD WITHOUT CONTRAST CT MAXILLOFACIAL WITHOUT CONTRAST CT CERVICAL SPINE WITHOUT CONTRAST TECHNIQUE: Multidetector CT imaging of the head, cervical spine, and maxillofacial structures were performed using the standard protocol without intravenous contrast. Multiplanar CT image reconstructions of the cervical spine and maxillofacial structures were also generated. COMPARISON:  11/05/2009 MRI of the lumbar spine. FINDINGS: CT HEAD FINDINGS Brain: Mild atrophic  changes are noted commensurate with the patient's given age. No findings to suggest acute hemorrhage, acute infarct or space-occupying mass lesion are noted. Scattered hypodensities are noted consistent with chronic white matter ischemic change. Vascular: No hyperdense vessel or unexpected calcification. Skull: No acute fracture is identified. Other: Soft tissue swelling is noted over the right eye consistent with the recent injury. Air-fluid level is noted within the right maxillary antrum as well  as fracture through the inferior orbital wall. CT MAXILLOFACIAL FINDINGS Osseous: There are changes consistent with a blowout fracture involving the right orbit with downward depression of the inferior orbital wall identified. No muscular entrapment is noted. Air-fluid level is noted within the right maxillary antrum with evidence of hemorrhage consistent with the recent injury. No other fracture is seen. Orbits: The globes are unremarkable bilaterally. Some air is noted within the right orbit posteriorly related to communication with the right maxillary antrum secondary to the known fracture. Right periorbital soft tissue swelling is seen. Sinuses: Air-fluid level with hemorrhage is noted within the right maxillary antrum. Some mucosal thickening is noted within the nasal passages as well as within the ethmoid air cells on the right. No other focal air-fluid level is seen. Soft tissues: Considerable soft tissue swelling is noted over the right orbit consistent with the recent injury. CT CERVICAL SPINE FINDINGS Alignment: There is loss of the normal cervical lordosis. Mild anterolisthesis of C2 on C3 and C3 on C4 as well as C4 on C5 is noted related to facet hypertrophic changes. Mild retrolisthesis of C5 with respect to C4 and C6 is seen but stable from a prior MRI examination of 2011. Skull base and vertebrae: Vertebral body height is well maintained. Multilevel facet hypertrophic changes are noted as well as osteophytic changes which contribute to central canal stenosis. The retrolisthesis of C5 with respect to C6 also contributes to central canal stenosis as well as neural foraminal narrowing. Multilevel neural foraminal narrowing is seen related to the facet hypertrophic changes. No acute fracture or acute facet abnormality is noted. Soft tissues and spinal canal: No significant spinal cord compression is noted. Surrounding soft tissues are within normal limits. Upper chest: Within normal limits. Other: None  IMPRESSION: CT of the head: Mild atrophic and chronic white matter ischemic changes commensurate with the patient's given age. CT of the maxillofacial bones: Considerable soft tissue swelling is noted over the right orbit with evidence of fracture through the inferior orbital wall with downward displacement of the fracture fragment. No muscular entrapment is noted. Associated air-fluid level with hemorrhage is noted within the right maxillary antrum. No other fractures are seen. CT of the cervical spine: No acute abnormality noted. Multilevel degenerative change which has progressed from 2011. Stable retrolisthesis of C5 with respect to C4 and C6 is noted. Electronically Signed   By: Inez Catalina M.D.   On: 12/22/2017 15:19   Cardiac studies: EKG 12/22/2017: Sinus rhythm 76 bpm. Normal axis.. Normal conduction. Left atrial enlargement. Nonspecific T wave inversion lead III.  Echocardiogram 12/23/2017: - Left ventricle: The cavity size was normal. There was mild   concentric hypertrophy. Systolic function was vigorous. The   estimated ejection fraction was in the range of 65% to 70%. Wall   motion was normal; there were no regional wall motion   abnormalities. Diastolic function assessment limited due to   severity of mitral annular calcification. - Mitral valve: Moderately to severely calcified annulus. There was   mild regurgitation. - Left atrium: The atrium was mildly  dilated. - Right atrium: The atrium was mildly dilated. - Tricuspid valve: There was mild regurgitation. - Pulmonary arteries: PA peak pressure: 40 mm Hg (S).  Review of Systems  Constitutional: Negative.   HENT:       Right sided pain near her eye  Eyes: Negative for blurred vision.  Respiratory: Negative for shortness of breath.   Cardiovascular: Negative for chest pain.  Gastrointestinal: Negative for nausea and vomiting.  Genitourinary: Negative.   Musculoskeletal:       Right orbital fracture  Skin: Negative.    Neurological: Positive for headaches. Negative for loss of consciousness.  Psychiatric/Behavioral: Negative.   All other systems reviewed and are negative.  Blood pressure (!) 150/54, pulse 90, temperature 97.8 F (36.6 C), temperature source Oral, resp. rate 18, height 5' (1.524 m), weight 106.2 kg, SpO2 100 %. Physical Exam  Nursing note and vitals reviewed. Constitutional: She is oriented to person, place, and time. She appears well-developed and well-nourished. No distress.  Eyes:  Right periorbital ecchymosis.  Neck: No JVD present.  Cardiovascular: Normal rate and regular rhythm.  Murmur (II/VI soft holosystolci murmur RLSB) heard. Respiratory: Effort normal and breath sounds normal. She has no rales.  GI: Soft. Bowel sounds are normal. There is no abdominal tenderness.  Musculoskeletal: Normal range of motion.        General: No edema.  Lymphadenopathy:    She has no cervical adenopathy.  Neurological: She is alert and oriented to person, place, and time.  Skin: Skin is warm and dry.  Psychiatric: She has a normal mood and affect.    Assessment/Recommendations:  84 y/o Caucasian female with hypertension, hyperlipidemia, peripheral neuropathy in bilateral feet, now with sudden fall  Fall: By history, most likely a mechanical fall in a patient with neuropathy. She likely had a mild concussion after the event leading to amnesia about the events. There has been no arrhthymias on EKG or telemetry. Echocardiogram does not show any structural abnormalities that could cause syncope. At this point, I do not think she needs further cardiac workup. After her discharge, we will arrange event monitor to look for any arrhthymias, Will arrange outpatient follow up. I do not see any indication for pacemaker at this time.    Destiny Hagin J Breckon Reeves 12/23/2017, 12:25 PM   Jamin Humphries Esther Hardy, MD [facility] Pager: [phone] Office: [phone] If no answer Cell  [phone]

## 2017-12-23 NOTE — Progress Notes (Signed)
  Echocardiogram 2D Echocardiogram has been performed.  Bobbye Charleston 12/23/2017, 12:38 PM

## 2017-12-23 NOTE — Progress Notes (Signed)
SLP Cancellation Note  Patient Details Name: Sydney Booth MRN: [id number] DOB: Apr 21, 1931   Cancelled treatment:  Pt passed [facility] swallow screen last night and is eating without difficulty per her description; no formal SLP swallow f/u needed.  Orientation to elements of time have improved.  Instructed pt/family, if they notice issues with short-term memory s/p fall, please request SLP cognitive assessment. They agree.         Juan Quam Laurice 12/23/2017, 9:23 AM

## 2017-12-23 NOTE — Progress Notes (Signed)
Patient and family report nausea and dry heaving with dinner tray. Patient also reports feeling "very anxious." Medicated x1 with Zofran 4mg  PO. Page out to MD on call, Dr. Pilar Plate, and notified of the above findings.

## 2017-12-23 NOTE — Progress Notes (Signed)
Family Medicine Teaching Service Daily Progress Note Intern Pager: [phone]  Patient name: Sydney Booth Medical record number: [id number] Date of birth: Nov 04, 1931 Age: 84 y.o. Gender: female  Primary Care Provider: Marney Doctor, MD Consultants: ENT, ophtho  Code Status: Full  Assessment and Plan: Sydney Booth is a 84 y.o. female presenting after a sudden fall, found to have a left sided orbital fracture and presenting for further syncopal work-up. PMH is significant for hypertension, hyperlipidemia, left knee DJD, and "leaky" heart valve.  Sudden Onset Fall, possible Syncopal Event: Acute.  No further episodes since admit. VSS, alert and no focal neuro deficits on exam.  Remains in sinus on telemetry.  Troponin negative.  Main differential continues to include cardiac etiology (valvular vs arrhythmia) given sudden onset nature, vs mechanical fall vs orthostatic hypotension (less likely).  MRI without evidence of intracranial findings, rules out preceding CVA.  While Mg found to be 1.5, unlikely to have caused an arrhthymia.  -Consult cardiology, appreciate further recommendations on extended cardiology work-up - Follow-up echocardiogram -Orthostatic vitals this morning - EKG this a.m. - Monitor BMP, mag level-repleted with 2g Mg IV  - Vitals per routine, monitor telemetry -PT/OT evaluation  AMS s/p Fall: Acute, resolved.  Patient back at her mental baseline this morning, alert and oriented x3.  Does not remember incident, but now does remember leaving the house to go to the office.  CT and MRI head without acute findings or hemorrhage.  Initial AMS likely secondary to concussion in the setting of fall with orbital fracture as below. -Monitor mental status during shifts   R Inferior Orbital Fracture 2/2 Fall: Acute, improving.  Minimal pain.  Decreased ecchymoses and swelling around right eye, EOMI today bilaterally. Ophthalmology and ENT to be following up outpatient, no acute  interventions or antibiotics needed at this time. - No nose blowing -Elevate bed to help minimize swelling - Cool compresses as requested -Tylenol 1000mg  scheduled 3 times daily, may add more if patient endorses increased pain - Ensure follow-up with ENT in 1 week, ophthalmology in 2 weeks  Hypertension: Chronic, stable.  SBP 130-160s overnight. -Monitor BP -Continue to hold home metoprolol in setting of likely syncopal episode as above  Hyperlipidemia: Chronic, stable.  Takes simvastatin 20 mg at home. - Continue home simvastatin  FEN/GI:  Regular diet Prophylaxis:  Start Lovenox, D/C SCDs  Disposition: Continue work-up for likely syncopal event, likely discharge in the next 1-2 days  Subjective:  Much improved this morning, states she is not having very much pain in her right eye.  Denies any additional pain elsewhere.  Denies any chest pain, palpitations, shortness of breath, weakness, or numbness in region other than the right side of her face.  Objective: Temp:  [97.7 F (36.5 C)-98.5 F (36.9 C)] 97.7 F (36.5 C) (12/19 0802) Pulse Rate:  [69-96] 78 (12/19 0802) Resp:  [10-22] 18 (12/19 0802) BP: (130-162)/(50-98) 148/62 (12/19 0802) SpO2:  [93 %-100 %] 99 % (12/19 0802) Weight:  [106.2 kg-106.3 kg] 106.2 kg (12/19 0542) Physical Exam: General: Alert, NAD HEENT:MMM, EOMI, however slightly limited in the right eye due to pain.  Visual field intact bilaterally.  Decreased swelling around the right eye, still with significant ecchymoses.  Right conjunctive is red with no hyphema.  Pupils were round, equal and reactive to light. Cardiac: RRR faint heart sounds with faint systolic murmur Lungs: Clear bilaterally, no increased WOB  Abdomen: soft, non-tender, non-distended, normoactive BS Msk: Moves all extremities spontaneously  Ext:  Warm, dry, 2+ distal pulses, no edema  Neuro: Alert and oriented x3, speech appropriate able to follow commands and answer questions. PERRL.   CN II-XII intact.  5/5 muscle strength in bilateral in upper lower extremity.  Slight decrease sensation on the right side of her face in setting of fracture.   Laboratory: Recent Labs  Lab 12/22/17 1444  WBC 5.0  HGB 13.3  HCT 41.2  PLT 249   Recent Labs  Lab 12/22/17 1444  NA 138  K 3.9  CL 102  CO2 25  BUN 9  CREATININE 0.85  CALCIUM 9.3  PROT 6.7  BILITOT 0.6  ALKPHOS 77  ALT 13  AST 23  GLUCOSE 135*     Imaging/Diagnostic Tests: Ct Head Wo Contrast  Result Date: 12/22/2017 CLINICAL DATA:  Recent fall with facial pain, initial encounter EXAM: CT HEAD WITHOUT CONTRAST CT MAXILLOFACIAL WITHOUT CONTRAST CT CERVICAL SPINE WITHOUT CONTRAST TECHNIQUE: Multidetector CT imaging of the head, cervical spine, and maxillofacial structures were performed using the standard protocol without intravenous contrast. Multiplanar CT image reconstructions of the cervical spine and maxillofacial structures were also generated. COMPARISON:  11/05/2009 MRI of the lumbar spine. FINDINGS: CT HEAD FINDINGS Brain: Mild atrophic changes are noted commensurate with the patient's given age. No findings to suggest acute hemorrhage, acute infarct or space-occupying mass lesion are noted. Scattered hypodensities are noted consistent with chronic white matter ischemic change. Vascular: No hyperdense vessel or unexpected calcification. Skull: No acute fracture is identified. Other: Soft tissue swelling is noted over the right eye consistent with the recent injury. Air-fluid level is noted within the right maxillary antrum as well as fracture through the inferior orbital wall. CT MAXILLOFACIAL FINDINGS Osseous: There are changes consistent with a blowout fracture involving the right orbit with downward depression of the inferior orbital wall identified. No muscular entrapment is noted. Air-fluid level is noted within the right maxillary antrum with evidence of hemorrhage consistent with the recent injury. No  other fracture is seen. Orbits: The globes are unremarkable bilaterally. Some air is noted within the right orbit posteriorly related to communication with the right maxillary antrum secondary to the known fracture. Right periorbital soft tissue swelling is seen. Sinuses: Air-fluid level with hemorrhage is noted within the right maxillary antrum. Some mucosal thickening is noted within the nasal passages as well as within the ethmoid air cells on the right. No other focal air-fluid level is seen. Soft tissues: Considerable soft tissue swelling is noted over the right orbit consistent with the recent injury. CT CERVICAL SPINE FINDINGS Alignment: There is loss of the normal cervical lordosis. Mild anterolisthesis of C2 on C3 and C3 on C4 as well as C4 on C5 is noted related to facet hypertrophic changes. Mild retrolisthesis of C5 with respect to C4 and C6 is seen but stable from a prior MRI examination of 2011. Skull base and vertebrae: Vertebral body height is well maintained. Multilevel facet hypertrophic changes are noted as well as osteophytic changes which contribute to central canal stenosis. The retrolisthesis of C5 with respect to C6 also contributes to central canal stenosis as well as neural foraminal narrowing. Multilevel neural foraminal narrowing is seen related to the facet hypertrophic changes. No acute fracture or acute facet abnormality is noted. Soft tissues and spinal canal: No significant spinal cord compression is noted. Surrounding soft tissues are within normal limits. Upper chest: Within normal limits. Other: None IMPRESSION: CT of the head: Mild atrophic and chronic white matter ischemic changes commensurate with  the patient's given age. CT of the maxillofacial bones: Considerable soft tissue swelling is noted over the right orbit with evidence of fracture through the inferior orbital wall with downward displacement of the fracture fragment. No muscular entrapment is noted. Associated  air-fluid level with hemorrhage is noted within the right maxillary antrum. No other fractures are seen. CT of the cervical spine: No acute abnormality noted. Multilevel degenerative change which has progressed from 2011. Stable retrolisthesis of C5 with respect to C4 and C6 is noted. Electronically Signed   By: Inez Catalina M.D.   On: 12/22/2017 15:19   Ct Cervical Spine Wo Contrast  Result Date: 12/22/2017 CLINICAL DATA:  Recent fall with facial pain, initial encounter EXAM: CT HEAD WITHOUT CONTRAST CT MAXILLOFACIAL WITHOUT CONTRAST CT CERVICAL SPINE WITHOUT CONTRAST TECHNIQUE: Multidetector CT imaging of the head, cervical spine, and maxillofacial structures were performed using the standard protocol without intravenous contrast. Multiplanar CT image reconstructions of the cervical spine and maxillofacial structures were also generated. COMPARISON:  11/05/2009 MRI of the lumbar spine. FINDINGS: CT HEAD FINDINGS Brain: Mild atrophic changes are noted commensurate with the patient's given age. No findings to suggest acute hemorrhage, acute infarct or space-occupying mass lesion are noted. Scattered hypodensities are noted consistent with chronic white matter ischemic change. Vascular: No hyperdense vessel or unexpected calcification. Skull: No acute fracture is identified. Other: Soft tissue swelling is noted over the right eye consistent with the recent injury. Air-fluid level is noted within the right maxillary antrum as well as fracture through the inferior orbital wall. CT MAXILLOFACIAL FINDINGS Osseous: There are changes consistent with a blowout fracture involving the right orbit with downward depression of the inferior orbital wall identified. No muscular entrapment is noted. Air-fluid level is noted within the right maxillary antrum with evidence of hemorrhage consistent with the recent injury. No other fracture is seen. Orbits: The globes are unremarkable bilaterally. Some air is noted within the  right orbit posteriorly related to communication with the right maxillary antrum secondary to the known fracture. Right periorbital soft tissue swelling is seen. Sinuses: Air-fluid level with hemorrhage is noted within the right maxillary antrum. Some mucosal thickening is noted within the nasal passages as well as within the ethmoid air cells on the right. No other focal air-fluid level is seen. Soft tissues: Considerable soft tissue swelling is noted over the right orbit consistent with the recent injury. CT CERVICAL SPINE FINDINGS Alignment: There is loss of the normal cervical lordosis. Mild anterolisthesis of C2 on C3 and C3 on C4 as well as C4 on C5 is noted related to facet hypertrophic changes. Mild retrolisthesis of C5 with respect to C4 and C6 is seen but stable from a prior MRI examination of 2011. Skull base and vertebrae: Vertebral body height is well maintained. Multilevel facet hypertrophic changes are noted as well as osteophytic changes which contribute to central canal stenosis. The retrolisthesis of C5 with respect to C6 also contributes to central canal stenosis as well as neural foraminal narrowing. Multilevel neural foraminal narrowing is seen related to the facet hypertrophic changes. No acute fracture or acute facet abnormality is noted. Soft tissues and spinal canal: No significant spinal cord compression is noted. Surrounding soft tissues are within normal limits. Upper chest: Within normal limits. Other: None IMPRESSION: CT of the head: Mild atrophic and chronic white matter ischemic changes commensurate with the patient's given age. CT of the maxillofacial bones: Considerable soft tissue swelling is noted over the right orbit with evidence of fracture  through the inferior orbital wall with downward displacement of the fracture fragment. No muscular entrapment is noted. Associated air-fluid level with hemorrhage is noted within the right maxillary antrum. No other fractures are seen. CT of  the cervical spine: No acute abnormality noted. Multilevel degenerative change which has progressed from 2011. Stable retrolisthesis of C5 with respect to C4 and C6 is noted. Electronically Signed   By: Inez Catalina M.D.   On: 12/22/2017 15:19   Mr Brain Wo Contrast  Result Date: 12/22/2017 CLINICAL DATA:  84 y/o  F; status post fall. EXAM: MRI HEAD WITHOUT CONTRAST TECHNIQUE: Multiplanar, multiecho pulse sequences of the brain and surrounding structures were obtained without intravenous contrast. COMPARISON:  12/22/2017 CT head and maxillofacial CT. 04/14/2009 MRI head. FINDINGS: Brain: No acute infarction, hemorrhage, hydrocephalus, extra-axial collection or mass lesion. Mild progression nonspecific T2 FLAIR hyperintensities in subcortical and periventricular white matter are compatible with chronic microvascular ischemic changes for age. Mild progression of volume loss of the brain. Vascular: Normal flow voids. Skull and upper cervical spine: Normal marrow signal. Sinuses/Orbits: Right periorbital soft tissue edema. Right floor of orbit fracture with inferior displacement of fracture fragments as seen on prior maxillofacial CT. Blood fluid level within the right maxillary sinus. No abnormal signal of the mastoid air cells. Bilateral intra-ocular lens replacement. Other: None. IMPRESSION: 1. No acute intracranial abnormality. 2. Right periorbital edema, right floor of orbit fracture, and right maxillary sinus blood fluid level better characterized on maxillofacial CT. 3. Mild progression of chronic microvascular ischemic changes and volume loss of the brain from 2011. Electronically Signed   By: Kristine Garbe M.D.   On: 12/22/2017 19:53   Dg Chest Portable 1 View  Result Date: 12/22/2017 CLINICAL DATA:  84 year old female status post trip and fall today. Syncope. EXAM: PORTABLE CHEST 1 VIEW COMPARISON:  Chest radiographs 03/13/2011. FINDINGS: Portable AP semi upright view at 1616 hours.  Mediastinal contours are stable and within normal limits. Stable lung volumes. Allowing for portable technique the lungs are clear. Visualized tracheal air column is within normal limits. Osteopenia. No acute osseous abnormality identified. IMPRESSION: No acute cardiopulmonary abnormality or acute traumatic injury identified. Electronically Signed   By: Genevie Rielle M.D.   On: 12/22/2017 16:29   Ct Maxillofacial Wo Cm  Result Date: 12/22/2017 CLINICAL DATA:  Recent fall with facial pain, initial encounter EXAM: CT HEAD WITHOUT CONTRAST CT MAXILLOFACIAL WITHOUT CONTRAST CT CERVICAL SPINE WITHOUT CONTRAST TECHNIQUE: Multidetector CT imaging of the head, cervical spine, and maxillofacial structures were performed using the standard protocol without intravenous contrast. Multiplanar CT image reconstructions of the cervical spine and maxillofacial structures were also generated. COMPARISON:  11/05/2009 MRI of the lumbar spine. FINDINGS: CT HEAD FINDINGS Brain: Mild atrophic changes are noted commensurate with the patient's given age. No findings to suggest acute hemorrhage, acute infarct or space-occupying mass lesion are noted. Scattered hypodensities are noted consistent with chronic white matter ischemic change. Vascular: No hyperdense vessel or unexpected calcification. Skull: No acute fracture is identified. Other: Soft tissue swelling is noted over the right eye consistent with the recent injury. Air-fluid level is noted within the right maxillary antrum as well as fracture through the inferior orbital wall. CT MAXILLOFACIAL FINDINGS Osseous: There are changes consistent with a blowout fracture involving the right orbit with downward depression of the inferior orbital wall identified. No muscular entrapment is noted. Air-fluid level is noted within the right maxillary antrum with evidence of hemorrhage consistent with the recent injury. No other fracture  is seen. Orbits: The globes are unremarkable bilaterally.  Some air is noted within the right orbit posteriorly related to communication with the right maxillary antrum secondary to the known fracture. Right periorbital soft tissue swelling is seen. Sinuses: Air-fluid level with hemorrhage is noted within the right maxillary antrum. Some mucosal thickening is noted within the nasal passages as well as within the ethmoid air cells on the right. No other focal air-fluid level is seen. Soft tissues: Considerable soft tissue swelling is noted over the right orbit consistent with the recent injury. CT CERVICAL SPINE FINDINGS Alignment: There is loss of the normal cervical lordosis. Mild anterolisthesis of C2 on C3 and C3 on C4 as well as C4 on C5 is noted related to facet hypertrophic changes. Mild retrolisthesis of C5 with respect to C4 and C6 is seen but stable from a prior MRI examination of 2011. Skull base and vertebrae: Vertebral body height is well maintained. Multilevel facet hypertrophic changes are noted as well as osteophytic changes which contribute to central canal stenosis. The retrolisthesis of C5 with respect to C6 also contributes to central canal stenosis as well as neural foraminal narrowing. Multilevel neural foraminal narrowing is seen related to the facet hypertrophic changes. No acute fracture or acute facet abnormality is noted. Soft tissues and spinal canal: No significant spinal cord compression is noted. Surrounding soft tissues are within normal limits. Upper chest: Within normal limits. Other: None IMPRESSION: CT of the head: Mild atrophic and chronic white matter ischemic changes commensurate with the patient's given age. CT of the maxillofacial bones: Considerable soft tissue swelling is noted over the right orbit with evidence of fracture through the inferior orbital wall with downward displacement of the fracture fragment. No muscular entrapment is noted. Associated air-fluid level with hemorrhage is noted within the right maxillary antrum. No  other fractures are seen. CT of the cervical spine: No acute abnormality noted. Multilevel degenerative change which has progressed from 2011. Stable retrolisthesis of C5 with respect to C4 and C6 is noted. Electronically Signed   By: Inez Catalina M.D.   On: 12/22/2017 15:19    Patriciaann Clan, DO 12/23/2017, 9:08 AM PGY-1, [facility] Intern pager: [phone], text pages welcome

## 2017-12-24 ENCOUNTER — Inpatient Hospital Stay (HOSPITAL_COMMUNITY): Payer: Medicare Other

## 2017-12-24 LAB — CBC WITH DIFFERENTIAL/PLATELET
Abs Immature Granulocytes: 0.01 10*3/uL (ref 0.00–0.07)
Basophils Absolute: 0 10*3/uL (ref 0.0–0.1)
Basophils Relative: 1 %
Eosinophils Absolute: 0.1 10*3/uL (ref 0.0–0.5)
Eosinophils Relative: 2 %
HCT: 37.7 % (ref 36.0–46.0)
Hemoglobin: 12.7 g/dL (ref 12.0–15.0)
Immature Granulocytes: 0 %
Lymphocytes Relative: 25 %
Lymphs Abs: 1.1 10*3/uL (ref 0.7–4.0)
MCH: 30.5 pg (ref 26.0–34.0)
MCHC: 33.7 g/dL (ref 30.0–36.0)
MCV: 90.4 fL (ref 80.0–100.0)
Monocytes Absolute: 0.5 10*3/uL (ref 0.1–1.0)
Monocytes Relative: 10 %
Neutro Abs: 2.8 10*3/uL (ref 1.7–7.7)
Neutrophils Relative %: 62 %
Platelets: 237 10*3/uL (ref 150–400)
RBC: 4.17 MIL/uL (ref 3.87–5.11)
RDW: 12.6 % (ref 11.5–15.5)
WBC: 4.5 10*3/uL (ref 4.0–10.5)
nRBC: 0 % (ref 0.0–0.2)

## 2017-12-24 LAB — BASIC METABOLIC PANEL
Anion gap: 10 (ref 5–15)
BUN: 5 mg/dL — ABNORMAL LOW (ref 8–23)
CO2: 27 mmol/L (ref 22–32)
Calcium: 8.8 mg/dL — ABNORMAL LOW (ref 8.9–10.3)
Chloride: 101 mmol/L (ref 98–111)
Creatinine, Ser: 0.83 mg/dL (ref 0.44–1.00)
GFR calc Af Amer: >60 mL/min (ref >60)
GFR calc non Af Amer: >60 mL/min (ref >60)
Glucose, Bld: 108 mg/dL — ABNORMAL HIGH (ref 70–99)
Potassium: 3.4 mmol/L — ABNORMAL LOW (ref 3.5–5.1)
Sodium: 138 mmol/L (ref 135–145)

## 2017-12-24 LAB — MAGNESIUM: Magnesium: 2 mg/dL (ref 1.7–2.4)

## 2017-12-24 LAB — GLUCOSE, CAPILLARY: Glucose-Capillary: 95 mg/dL (ref 70–99)

## 2017-12-24 MED ORDER — TRAMADOL HCL 50 MG PO TABS
50.0000 mg | ORAL_TABLET | Freq: Four times a day (QID) | ORAL | Status: DC | PRN
  Administered 2017-12-24 – 2017-12-25 (×2): 50 mg via ORAL
  Filled 2017-12-24 (×2): qty 1

## 2017-12-24 MED ORDER — POTASSIUM CHLORIDE CRYS ER 20 MEQ PO TBCR
40.0000 meq | EXTENDED_RELEASE_TABLET | Freq: Once | ORAL | Status: AC
  Administered 2017-12-24: 40 meq via ORAL
  Filled 2017-12-24: qty 2

## 2017-12-24 NOTE — Progress Notes (Signed)
Family Medicine Teaching Service Daily Progress Note Intern Pager: [phone]  Patient name: Sydney Booth Medical record number: [id number] Date of birth: June 25, 1931 Age: 84 y.o. Gender: female  Primary Care Provider: Seward Carol, MD Consultants: ENT, ophtho  Code Status: Full  Assessment and Plan: Sydney Booth is a 84 y.o. female presenting after a sudden fall, found to have a left sided orbital fracture and presenting for further syncopal work-up. PMH is significant for hypertension, hyperlipidemia, left knee DJD, and "leaky" heart valve.  R Inferior Orbital Fracture 2/2 Fall: Acute, improving.  Endorsing headache and more pain in her right eye, associated with nausea. Ophthalmology and ENT to be following up outpatient, no acute interventions or antibiotics needed at this time. --Obtain CT head w/o contrast, rule out subdural hemorrhage - No nose blowing -Elevate bed to help minimize swelling - Cool compresses as requested -Tylenol 1000mg  scheduled 3 times daily -Start Tramadol 50 mg every 6 PRN - Ensure follow-up with ENT in 1 week, ophthalmology in 2 weeks  Sudden Onset Fall, possible Syncopal Event: Acute.  No further episodes since admit.  No arrhythmias noted on telemetry.  Differential continues to include arrhythmia, versus mechanical fall, versus orthostatic hypotension.  Cardiology moving forward with an outpatient cardiac monitor. -To follow-up with cardiology outpatient, will do event monitor -Vitals per routine, monitor telemetry -PT evaluation  AMS s/p Fall: Acute, resolved.  At mental baseline.  Initial AMS likely secondary to concussion in setting of fall.   -Monitor mental status during shifts   Hypertension: Chronic, stable.  SBP 110-160 overnight. -Monitor BP -Continue to hold home metoprolol in setting of likely syncopal episode as above  Hyperlipidemia: Chronic, stable.  Takes simvastatin 20 mg at home. - Continue home simvastatin  FEN/GI:  Regular  diet Prophylaxis:  Lovenox   Disposition: Likely discharge home pending CT head and PT evaluation  Subjective:  Continues to improve this morning, however is endorsing an aching headache and more pain in her right eye now.  Associated with nausea.  Denies any vomiting, abdominal pain, weakness.  Does endorse some numbness in the right side of her face around her orbital fracture, but no numbness or tingling elsewhere.   Objective: Temp:  [97.8 F (36.6 C)-98.6 F (37 C)] 98.1 F (36.7 C) (12/20 0739) Pulse Rate:  [77-90] 77 (12/20 0739) Resp:  [18] 18 (12/20 0739) BP: (118-162)/(54-82) 162/67 (12/20 0739) SpO2:  [97 %-100 %] 100 % (12/20 0739) Physical Exam: General: Alert, NAD HEENT: NCAT, MMM, EOMI, does have pain with this.  Visual field x5 laterally.  Decreased swelling around the right eye, ecchymoses has gone down.  Right conjunctival is red with no hyphema Cardiac: RRR no m/g/r Lungs: Clear bilaterally, no increased WOB  Abdomen: soft, non-tender, non-distended, normoactive BS Msk: Moves all extremities spontaneously  Ext: Warm, dry, 2+ distal pulses, no edema  Neuro: Alert and oriented x3 speech appropriate able to follow commands and answer questions.  PERRLA.  5/5 muscle strength in the bilateral upper and lower extremity.  Slight decrease sensation on the right side of her face, however can feel light touch.  Laboratory: Recent Labs  Lab 12/22/17 1444 12/24/17 0429  WBC 5.0 4.5  HGB 13.3 12.7  HCT 41.2 37.7  PLT 249 237   Recent Labs  Lab 12/22/17 1444 12/24/17 0429  NA 138 138  K 3.9 3.4*  CL 102 101  CO2 25 27  BUN 9 5*  CREATININE 0.85 0.83  CALCIUM 9.3 8.8*  PROT 6.7  --  BILITOT 0.6  --   ALKPHOS 77  --   ALT 13  --   AST 23  --   GLUCOSE 135* 108*     Imaging/Diagnostic Tests: No results found.  Sydney Clan, DO 12/24/2017, 8:59 AM PGY-1, [facility] Intern pager: [phone], text pages welcome

## 2017-12-24 NOTE — Care Management Important Message (Signed)
Important Message  Patient Details  Name: Sydney Booth MRN: [id number] Date of Birth: 1931/12/29   Medicare Important Message Given:  Yes    Madicyn Mesina 12/24/2017, 2:15 PM

## 2017-12-24 NOTE — Progress Notes (Signed)
Physical Therapy Treatment Patient Details Name: Sydney Booth MRN: [id number] DOB: 23-Jun-1931 Today's Date: 12/24/2017    History of Present Illness Pt is an 84 y.o. female presenting after a sudden fall, found to have a left sided orbital fracture and presenting for further syncopal work-up. PMH is significant for hypertension, hyperlipidemia, left knee DJD, and "leaky" heart valve.    PT Comments    Pt making steady progress with functional mobility, improved stability with ambulation and tolerated increased distance. Pt would continue to benefit from skilled physical therapy services at this time while admitted and after d/c to address the below listed limitations in order to improve overall safety and independence with functional mobility.    Follow Up Recommendations  Home health PT;Supervision/Assistance - 24 hour     Equipment Recommendations  None recommended by PT    Recommendations for Other Services       Precautions / Restrictions Precautions Precautions: Fall Restrictions Weight Bearing Restrictions: No    Mobility  Bed Mobility Overal bed mobility: Needs Assistance Bed Mobility: Supine to Sit     Supine to sit: Supervision     General bed mobility comments: supervision for safety  Transfers Overall transfer level: Needs assistance Equipment used: Rolling walker (2 wheeled) Transfers: Sit to/from Stand Sit to Stand: Min guard         General transfer comment: min guard for safety, cueing for safe hand placement  Ambulation/Gait Ambulation/Gait assistance: Min guard Gait Distance (Feet): 100 Feet Assistive device: Rolling walker (2 wheeled) Gait Pattern/deviations: Step-through pattern;Decreased stride length Gait velocity: decreased   General Gait Details: pt steady with RW, occasional cueing to maintain a safe distance, no LOB or need for physical assistance, min guard for safety   Stairs             Wheelchair Mobility    Modified  Rankin (Stroke Patients Only)       Balance Overall balance assessment: Needs assistance Sitting-balance support: Feet supported Sitting balance-Leahy Scale: Good     Standing balance support: Bilateral upper extremity supported Standing balance-Leahy Scale: Poor                              Cognition Arousal/Alertness: Awake/alert Behavior During Therapy: WFL for tasks assessed/performed Overall Cognitive Status: Within Functional Limits for tasks assessed                                        Exercises      General Comments        Pertinent Vitals/Pain Pain Assessment: Faces Faces Pain Scale: Hurts little more Pain Location: R eye Pain Descriptors / Indicators: Aching;Discomfort Pain Intervention(s): Monitored during session;Repositioned    Home Living                      Prior Function            PT Goals (current goals can now be found in the care plan section) Acute Rehab PT Goals PT Goal Formulation: With patient Time For Goal Achievement: 01/06/18 Potential to Achieve Goals: Good Progress towards PT goals: Progressing toward goals    Frequency    Min 3X/week      PT Plan Current plan remains appropriate    Co-evaluation  AM-PAC PT "6 Clicks" Mobility   Outcome Measure  Help needed turning from your back to your side while in a flat bed without using bedrails?: None Help needed moving from lying on your back to sitting on the side of a flat bed without using bedrails?: None Help needed moving to and from a bed to a chair (including a wheelchair)?: A Little Help needed standing up from a chair using your arms (e.g., wheelchair or bedside chair)?: A Little Help needed to walk in hospital room?: A Little Help needed climbing 3-5 steps with a railing? : A Little 6 Click Score: 20    End of Session Equipment Utilized During Treatment: Gait belt Activity Tolerance: Patient tolerated  treatment well Patient left: with call bell/phone within reach;Other (comment)(in bathroom with nurse tech present in room) Nurse Communication: Mobility status PT Visit Diagnosis: Other abnormalities of gait and mobility (R26.89)     Time: 6438-3779 PT Time Calculation (min) (ACUTE ONLY): 12 min  Charges:  $Gait Training: 8-22 mins                     Sherie Don, [place], DPT  Acute Rehabilitation Services Pager [phone] Office [place] 12/24/2017, 1:01 PM

## 2017-12-24 NOTE — Progress Notes (Signed)
Occupational Therapy Treatment Patient Details Name: Sydney Booth MRN: [id number] DOB: [birth year] Today's Date: 12/24/2017    History of present illness Pt is an 84 y.o. female presenting after a sudden fall, found to have a left sided orbital fracture and presenting for further syncopal work-up. PMH is significant for hypertension, hyperlipidemia, left knee DJD, and "leaky" heart valve.   OT comments  Trialed use of visual occlusion to decrease diploplia as pt reports symptoms of diplopia intermittently throughout the day. Use of occlusion to R eye glass lens with pt reporting improvements in symptoms. Trialed from bed level as pt having just returned to bed from being up and requesting to defer mobility at this time. Will continue to assess visual deficits and trial use of visual occlusion with mobility and during functional tasks in following treatment sessions. Continue per POC.   Follow Up Recommendations  Home health OT;Supervision/Assistance - 24 hour    Equipment Recommendations  None recommended by OT          Precautions / Restrictions Precautions Precautions: Fall Restrictions Weight Bearing Restrictions: No                                                     ADL either performed or assessed with clinical judgement   ADL                                         General ADL Comments: pt reports having just returned to bed from bathroom and requesting not to get up at this time. Reviewed use of visual occlusion and trialed from bed level position. Pt reports improvements in visual status with use of R lens occlusion. Educated pt/family on purpose of visual occlusion and progression to decrease use of taping. Also issued post-concussion handout and reviewed with family.      Vision   Additional Comments: see ADL comments above   Perception     Praxis      Cognition Arousal/Alertness: Awake/alert Behavior During Therapy: WFL  for tasks assessed/performed Overall Cognitive Status: Within Functional Limits for tasks assessed                                          Exercises     Shoulder Instructions       General Comments      Pertinent Vitals/ Pain       Pain Assessment: Faces Faces Pain Scale: Hurts little more Pain Location: R eye Pain Descriptors / Indicators: Aching;Discomfort Pain Intervention(s): Monitored during session  Home Living                                          Prior Functioning/Environment              Frequency  Min 2X/week        Progress Toward Goals  OT Goals(current goals can now be found in the care plan section)  Progress towards OT goals: Progressing toward goals  Acute Rehab OT Goals Patient Stated Goal: to feel  better OT Goal Formulation: With patient/family Time For Goal Achievement: 01/20/18 Potential to Achieve Goals: Good  Plan Discharge plan remains appropriate    Co-evaluation                 AM-PAC OT "6 Clicks" Daily Activity     Outcome Measure   Help from another person eating meals?: None Help from another person taking care of personal grooming?: A Little Help from another person toileting, which includes using toliet, bedpan, or urinal?: A Little Help from another person bathing (including washing, rinsing, drying)?: A Little Help from another person to put on and taking off regular upper body clothing?: A Little Help from another person to put on and taking off regular lower body clothing?: A Lot 6 Click Score: 18    End of Session    OT Visit Diagnosis: Unsteadiness on feet (R26.81);Muscle weakness (generalized) (M62.81);Other symptoms and signs involving cognitive function;Pain Pain - Right/Left: Right Pain - part of body: (eye)   Activity Tolerance Patient tolerated treatment well   Patient Left in bed;with call bell/phone within reach;with family/visitor present   Nurse  Communication Mobility status        Time: 3567-0141 OT Time Calculation (min): 16 min  Charges: OT General Charges $OT Visit: 1 Visit OT Treatments $Therapeutic Activity: 8-22 mins  Lou Cal, OT Supplemental Rehabilitation Services Pager [phone] Office [place] 12/24/2017, 4:29 PM

## 2017-12-25 DIAGNOSIS — W19XXXA Unspecified fall, initial encounter: Secondary | ICD-10-CM

## 2017-12-25 DIAGNOSIS — G6289 Other specified polyneuropathies: Secondary | ICD-10-CM

## 2017-12-25 DIAGNOSIS — G629 Polyneuropathy, unspecified: Secondary | ICD-10-CM

## 2017-12-25 DIAGNOSIS — S0232XA Fracture of orbital floor, left side, initial encounter for closed fracture: Secondary | ICD-10-CM

## 2017-12-25 LAB — BASIC METABOLIC PANEL
Anion gap: 8 (ref 5–15)
BUN: 7 mg/dL — ABNORMAL LOW (ref 8–23)
CO2: 29 mmol/L (ref 22–32)
Calcium: 9 mg/dL (ref 8.9–10.3)
Chloride: 100 mmol/L (ref 98–111)
Creatinine, Ser: 0.86 mg/dL (ref 0.44–1.00)
GFR calc Af Amer: >60 mL/min (ref >60)
GFR calc non Af Amer: >60 mL/min (ref >60)
Glucose, Bld: 100 mg/dL — ABNORMAL HIGH (ref 70–99)
Potassium: 4.6 mmol/L (ref 3.5–5.1)
Sodium: 137 mmol/L (ref 135–145)

## 2017-12-25 LAB — CBC WITH DIFFERENTIAL/PLATELET
Abs Immature Granulocytes: 0.01 10*3/uL (ref 0.00–0.07)
Basophils Absolute: 0 10*3/uL (ref 0.0–0.1)
Basophils Relative: 1 %
Eosinophils Absolute: 0.1 10*3/uL (ref 0.0–0.5)
Eosinophils Relative: 2 %
HCT: 38.7 % (ref 36.0–46.0)
Hemoglobin: 12.8 g/dL (ref 12.0–15.0)
Immature Granulocytes: 0 %
Lymphocytes Relative: 22 %
Lymphs Abs: 1 10*3/uL (ref 0.7–4.0)
MCH: 30.1 pg (ref 26.0–34.0)
MCHC: 33.1 g/dL (ref 30.0–36.0)
MCV: 91.1 fL (ref 80.0–100.0)
Monocytes Absolute: 0.5 10*3/uL (ref 0.1–1.0)
Monocytes Relative: 10 %
Neutro Abs: 3 10*3/uL (ref 1.7–7.7)
Neutrophils Relative %: 65 %
Platelets: 243 10*3/uL (ref 150–400)
RBC: 4.25 MIL/uL (ref 3.87–5.11)
RDW: 12.5 % (ref 11.5–15.5)
WBC: 4.7 10*3/uL (ref 4.0–10.5)
nRBC: 0 % (ref 0.0–0.2)

## 2017-12-25 LAB — GLUCOSE, CAPILLARY: Glucose-Capillary: 93 mg/dL (ref 70–99)

## 2017-12-25 MED ORDER — TRAMADOL HCL 50 MG PO TABS: 50.0000 mg | ORAL_TABLET | Freq: Four times a day (QID) | ORAL | 0 refills | Status: DC | PRN

## 2017-12-25 MED ORDER — TRAMADOL HCL 50 MG PO TABS: 50.0000 mg | ORAL_TABLET | Freq: Four times a day (QID) | ORAL | 0 refills | Status: AC | PRN

## 2017-12-25 NOTE — Discharge Instructions (Signed)
You were admitted to [facility] after you had a sudden fall and sustained a fracture in the bone below your eye.  While you are here, we evaluated for any other reasons as to why you would have had a sudden fall other than slipping on your own.  Fortunately, your heart shows good pumping function and we have not noticed any strange rhythms to your heart.  You did not have a stroke.  Make sure you follow-up with the heart doctors, cardiology, to have that event monitor to look for strange rhythms of your heart.  Also make sure you follow-up with ENT, preferably this next week, and ophthalmology (eye doctor) in the next week-2 weeks.  For pain control, I provided you with some additional tablets of tramadol to take as needed for more severe pain.  You may take Tylenol 1000mg  3 times a day scheduled for the next few days to help maintain a background pain management.  Also, continue cool compresses with a washcloth over your eye as needed.

## 2017-12-25 NOTE — Progress Notes (Signed)
Family Medicine Teaching Service Daily Progress Note Intern Pager: [phone]  Patient name: Sydney Booth Medical record number: [id number] Date of birth: Jul 15, 1931 Age: 84 y.o. Gender: female  Primary Care Provider: Seward Carol, MD Consultants: ENT, ophtho  Code Status: Full  Assessment and Plan: Sydney Booth is a 84 y.o. female presenting after a sudden fall, found to have a left sided orbital fracture and presenting for further syncopal work-up. PMH is significant for hypertension, hyperlipidemia, left knee DJD, and "leaky" heart valve.  R Inferior Orbital Fracture 2/2 Fall: Acute, improving.  No further headache, nausea.  EOMI.  Ecchymoses and swelling improving.  CT head without contrast no evidence of hemorrhage.  Ophthalmology and ENT to be following up outpatient. -No nose blowing -Elevate bed to minimize swelling -Cool compresses as needed -Tylenol thousand milligrams schedule III times daily -Continue tramadol 50 mg every 6 as needed -Ensure follow-up with ENT in 1 week, ophthalmology in 2 weeks  Sudden Onset Fall, possible Syncopal Event: Acute.  No further episodes since admit.  No arrhythmias on telemetry.  Differential could continue to include arrhythmia, mechanical fall, orthostatic hypotension.   -To follow-up with cardiology outpatient, will do event monitor -Vitals per routine, monitor telemetry -Home health PT and OT ordered to be placed  AMS s/p Fall: Acute, resolved.  At mental baseline.  Initial AMS likely secondary to concussion in setting of fall.   -Monitor mental status during shifts   Hypertension: Chronic, stable.  SBP 110-160 overnight.  We will continue to hold her home metoprolol, heart rate been 70-80s.  May reevaluate reinitiation outpatient with her PCP. -Monitor BP  Hyperlipidemia: Chronic, stable.  Takes simvastatin 20 mg at home. - Continue home simvastatin  FEN/GI:  Regular diet Prophylaxis:  Lovenox   Disposition: Likely discharge  home this morning.  Subjective:  Doing well this morning, ready to go home.  Not having any further headache, nausea.  States the tramadol every so often is very effective for her eye pain.  Objective: Temp:  [97.4 F (36.3 C)-98.7 F (37.1 C)] 97.4 F (36.3 C) (12/21 0732) Pulse Rate:  [72-85] 75 (12/21 0732) Resp:  [16-20] 18 (12/21 0732) BP: (106-155)/(57-81) 155/72 (12/21 0732) SpO2:  [93 %-99 %] 99 % (12/21 0732) Weight:  [107.1 kg] 107.1 kg (12/21 0425) Physical Exam: General: Alert, NAD HEENT: NCAT, MMM, EOMI however with some pain.  Visual field intact bilaterally.  Decreasing swelling and ecchymoses around her right eye, right conjunctive eye is still red with no hyphema. Cardiac: RRR no m/g/r Lungs: Clear bilaterally, no increased WOB  Abdomen: soft, non-tender, non-distended, normoactive BS Msk: Moves all extremities spontaneously  Ext: Warm, dry, 2+ distal pulses, no edema  Neuro: Alert and oriented x3.  Still with decreased sensation on the right side of her face with some tingling, however can feel light touch.  PERRL.  No focal neuro deficits on exam.  Laboratory: Recent Labs  Lab 12/22/17 1444 12/24/17 0429 12/25/17 0533  WBC 5.0 4.5 4.7  HGB 13.3 12.7 12.8  HCT 41.2 37.7 38.7  PLT 249 237 243   Recent Labs  Lab 12/22/17 1444 12/24/17 0429 12/25/17 0533  NA 138 138 137  K 3.9 3.4* 4.6  CL 102 101 100  CO2 25 27 29   BUN 9 5* 7*  CREATININE 0.85 0.83 0.86  CALCIUM 9.3 8.8* 9.0  PROT 6.7  --   --   BILITOT 0.6  --   --   ALKPHOS 77  --   --  ALT 13  --   --   AST 23  --   --   GLUCOSE 135* 108* 100*     Imaging/Diagnostic Tests: Ct Head Wo Contrast  Result Date: 12/24/2017 CLINICAL DATA:  Acute headache. EXAM: CT HEAD WITHOUT CONTRAST TECHNIQUE: Contiguous axial images were obtained from the base of the skull through the vertex without intravenous contrast. COMPARISON:  12/22/2017 MRI. 12/22/2017 CT. FINDINGS: Brain: Cerebral atrophy is  likely age-appropriate. Mild low density in the periventricular white matter likely related to small vessel disease. No mass lesion, hemorrhage, hydrocephalus, acute infarct, intra-axial, or extra-axial fluid collection. Vascular: Intracranial atherosclerosis. Skull: No skull fracture. Sinuses/Orbits: Redemonstration of soft tissue swelling about the right orbit with hemorrhage in the right maxillary sinus and fracture of the inferior wall of the right orbit. Grossly similar. Clear mastoid air cells. Other: None. IMPRESSION: 1. No acute intracranial abnormality. 2. Similar appearance of incompletely imaged soft tissue swelling about the right orbit with hemorrhage in the right maxillary sinus and fracture of the inferior wall of the right orbit. 3. Cerebral atrophy and small vessel ischemic change. Electronically Signed   By: Abigail Miyamoto M.D.   On: 12/24/2017 21:22    Patriciaann Clan, DO 12/25/2017, 8:05 AM PGY-1, [place] Intern pager: [phone], text pages welcome

## 2017-12-25 NOTE — Plan of Care (Signed)
  Problem: Education: Goal: Knowledge of General Education information will improve Description Including pain rating scale, medication(s)/side effects and non-pharmacologic comfort measures Outcome: Progressing Note:  POC reviewed with pt.   

## 2017-12-25 NOTE — Care Management Note (Signed)
Case Management Note  Patient Details  Name: Sydney Booth MRN: [id number] Date of Birth: 03-23-1931  Subjective/Objective:   For discharge, per Handoff note from [place] , set patient up with [facility], this NCM notified Jermaine with [facility] and made referral, he states he already has her on there list for [place], [place].                   Action/Plan: DC home when ready.   Expected Discharge Date:  12/25/17               Expected Discharge Plan:  [place]  In-House Referral:     Discharge planning Services  CM Consult  Post Acute Care Choice:  Home Health Choice offered to:  Patient  DME Arranged:    DME Agency:     HH Arranged:  PT, OT HH Agency:  [place]  Status of Service:  Completed, signed off  If discussed at [place] of Stay Meetings, dates discussed:    Additional Comments:  Zenon Mayo, RN 12/25/2017, 11:49 AM

## 2017-12-25 NOTE — Progress Notes (Signed)
Pt being discharged from hospital per orders from MD. Pt educated on discharge instructions. Pt verbalized understanding of instructions. All questions and concerns were addressed. Pt's IV was removed prior to discharge. Pt exited hospital via wheelchair accompanied by staff. 

## 2017-12-25 NOTE — Discharge Summary (Signed)
Sydney Booth  Patient name: Sydney Booth Medical record number: [id number] Date of birth: 02/26/1931 Age: 84 y.o. Gender: female Date of Admission: 12/22/2017  Date of Discharge: 12/25/2017  Admitting Physician: Sydney Reasons, MD  Primary Care Provider: Seward Carol, MD Consultants: Sydney Booth, ENT    Indication for Hospitalization: Sudden fall, right orbital fracture  Discharge Diagnoses/Problem List:  Sudden fall, likely mechanical Concussion, resolved Right inferior orbital fracture Hypertension Hyperlipidemia  Disposition: Home with home health PT/OT  Discharge Condition: Stable  Discharge Exam:  General: Alert, NAD HEENT: NCAT, MMM, EOMI however with some pain.  Visual field intact bilaterally.  Decreasing swelling and ecchymoses around her right eye, right conjunctive eye is still red with no hyphema. Cardiac: RRR no m/g/r Lungs: Clear bilaterally, no increased WOB  Abdomen: soft, non-tender, non-distended, normoactive BS Msk: Moves all extremities spontaneously  Ext: Warm, dry, 2+ distal pulses, no edema  Neuro: Alert and oriented x3.  Still with decreased sensation on the right side of her face with some tingling, however can feel light touch.  PERRL.  No focal neuro deficits on exam.  Brief Hospital Course:  Sydney Booth is a 84 year old female that presented after a partially unwitnessed sudden fall and found to have a right inferior orbital fracture.  Sudden onset fall: Differential continues to include arrhythmia, mechanical fall, or orthostatic hypotension.  Reassuringly, echo with EF 65-70% without any major valvular abnormalities, no arrhythmias noted on telemetry, MRI brain negative for CVA, and blood pressure remained stable throughout her stay.  Mg found to be 1.5 & repleted, however unlikely to have caused an arrhythmia precipitating fall.  Cardiology consulted due to concern of cardiac etiology, will be following up with  an event monitor. No further events during admission.  R Orbital fracture 2/2 fall: CT maxillofacial showing a right orbit fracture through the inferior orbital wall with downward displacement of the fracture fragment, no muscular entrapment.  No further fractures on CT maxillofacial, cervical spine or head. Ophthalmology and ENT both consulted, recommended no acute interventions or antibiotics, to both follow-up outpatient. At discharge, EOMI, PERRL, with improving ecchymoses and swelling around the eye, continue to endorse minor decreased sensation on the right side of her face.   Issues for Follow Up:  1. Monitor right eye.  Ensure follow-up with ophthalmology (~ 2 weeks after d/c) and ENT (~ 1 week after d/c).  Referrals for both of these need to be placed by her PCP, according to family/her insurance policy. Recommend no nose blowing.  2. Follow-up BP and HR.  SBP 130s on average, max 160s.  HR remained around 70-80s.  Held her home metoprolol during stay and at discharge given concern for possible orthostatic hypotension as part of the differential.  May reevaluate restarting metoprolol outpatient. 3. Ensure she follows up with cardiology for event monitor placing.  Has appointment scheduled for 1/27.  Significant Procedures: None  Significant Labs and Imaging:  Recent Labs  Lab 12/22/17 1444 12/24/17 0429 12/25/17 0533  WBC 5.0 4.5 4.7  HGB 13.3 12.7 12.8  HCT 41.2 37.7 38.7  PLT 249 237 243   Recent Labs  Lab 12/22/17 1444 12/22/17 1817 12/24/17 0429 12/25/17 0533  NA 138  --  138 137  K 3.9  --  3.4* 4.6  CL 102  --  101 100  CO2 25  --  27 29  GLUCOSE 135*  --  108* 100*  BUN 9  --  5* 7*  CREATININE 0.85  --  0.83 0.86  CALCIUM 9.3  --  8.8* 9.0  MG  --  1.5* 2.0  --   PHOS  --  3.0  --   --   ALKPHOS 77  --   --   --   AST 23  --   --   --   ALT 13  --   --   --   ALBUMIN 3.5  --   --   --     CT of the head: Mild atrophic and chronic white matter  ischemic changes commensurate with the patient's given age.  CT of the maxillofacial bones: Considerable soft tissue swelling is noted over the right orbit with evidence of fracture through the inferior orbital wall with downward displacement of the fracture fragment. No muscular entrapment is noted. Associated air-fluid level with hemorrhage is noted within the right maxillary antrum.  No other fractures are seen.  CT of the cervical spine: No acute abnormality noted.  Multilevel degenerative change which has progressed from 2011. Stable retrolisthesis of C5 with respect to C4 and C6 is noted.  MRI Brain: IMPRESSION: 1. No acute intracranial abnormality. 2. Right periorbital edema, right floor of orbit fracture, and right maxillary sinus blood fluid level better characterized on maxillofacial CT. 3. Mild progression of chronic microvascular ischemic changes and volume loss of the brain from 2011.  Results/Tests Pending at Time of Discharge: none  Discharge Medications:  Allergies as of 12/25/2017      Reactions   Sulfa Antibiotics Hives   Zocor [simvastatin]    Muscle aches   Food    Milk  Dariey products      Medication List    STOP taking these medications   metoprolol tartrate 25 MG tablet Commonly known as:  LOPRESSOR     TAKE these medications   aspirin 81 MG tablet Take 81 mg by mouth daily.   calcium carbonate 600 MG Tabs tablet Commonly known as:  OS-CAL Take 600 mg by mouth daily.   CINNAMON PO Take 500 mg by mouth 2 (two) times daily.   Fish Oil 1000 MG Cpdr Take 1,000 mg by mouth daily.   omeprazole 40 MG capsule Commonly known as:  PRILOSEC Take 40 mg by mouth daily.   simvastatin 20 MG tablet Commonly known as:  ZOCOR Take 20 mg by mouth daily.   traMADol 50 MG tablet Commonly known as:  ULTRAM Take 1 tablet (50 mg total) by mouth every 6 (six) hours as needed for up to 3 days for moderate pain or severe pain.   VITAMIN C  PO Take 1 tablet by mouth daily.   VITAMIN D (CHOLECALCIFEROL) PO Take 1 tablet by mouth daily.       Discharge Instructions: Please refer to Patient Instructions section of EMR for full details.  Patient was counseled important signs and symptoms that should prompt return to medical care, changes in medications, dietary instructions, activity restrictions, and follow up appointments.   Follow-Up Appointments: Follow-up Information    Nigel Mormon, MD Follow up on 01/31/2018.   Specialty:  Cardiology Why:  1:30 PM Contact information: [place] [address] [phone]        Jerrell Belfast, MD. Schedule an appointment as soon as possible for a visit.   Specialty:  Otolaryngology Why:  Make sure your PCP puts in a referral for you to follow up with ENT. Recommend you see Dr. Wilburn Cornelia or associate this  week.  Contact information: [address] [place] [address] [phone]        Sydney Carol, MD. Schedule an appointment as soon as possible for a visit.   Specialty:  Internal Medicine Why:  Schedule an appointment for a hospital follow up in the next few days.  Contact information: [address] [place] [address] [phone]        [facility]ates, [place]. Schedule an appointment as soon as possible for a visit.   Specialty:  Ophthalmology Why:  follow up in the next 1-2 weeks.  Contact information: [place] [address] [phone]        [facility]-Home Follow up.   Specialty:  Home Health Services Why:  HHPT, HHOT Contact information: [place] [address] [place], [place], DO 12/27/2017, 8:54 PM PGY-1, [place]

## 2018-01-31 NOTE — Progress Notes (Signed)
Cardiology Office Note:    Date:  02/01/2018   ID:  Sydney Booth, DOB 1931/11/01, MRN [id number]  PCP:  Sydney Carol, MD  Cardiologist:  No primary care provider on file.   Referring MD: Sydney Carol, MD   Chief Complaint  Patient presents with  . Loss of Consciousness    History of Present Illness:    Sydney Booth is a 85 y.o. female with a hx of hypertension and hyperlipidemia referred by Dr. Delfina Booth for consultation concerning arrhythmia could have caused syncope resulting in a fall with right orbital fracture.  The patient was going into her chiropractor's office on a level grade sidewalk when she fell and fractured her right orbital bone in the inferior margin.  She has no recollection of why she ended up on the ground with the injury.  There were no preceding symptoms of heart racing, chest pain, shortness of breath, or other issues.  She does not believe she fell due to a trip for mechanical fall.  She has no history of fainting.  She has no cardiac history other than "mitral valve prolapse", which has been disputed by a more recent cardiac evaluation in the in the 2000 decade while living in [place].  In hospital evaluation with imaging of the brain was unremarkable other than traumatic injury to the right orbital bone.  She has a history of sleep apnea and is compliant with CPAP.  She does note that she can feel her heart pounding when she is emotionally upset and assumes that the fast heart beating is due to stress.  She has no complaints of chest pain, orthopnea, PND, lower extremity swelling, orthopnea, or stroke.  Brain imaging while hospitalized did not demonstrate evidence of stroke or carotid disease.  Past Medical History:  Diagnosis Date  . Arthritis   . Blood transfusion   . Cervical vertebral fracture (HCC)    fued on its on  . Closed head injury    as a chlid  . Fall 12/22/2017   right eye bruised  . Gastric ulcer   . GERD (gastroesophageal reflux disease)     . H/O hiatal hernia   . Headache(784.0)   . Hyperlipemia   . Hypertension   . Mitral valve prolapse   . Obesity   . OSA on CPAP   . Osteopenia   . Rotator cuff tear    Left  . Seasonal allergies   . Shortness of breath    With activity  . Ulcer 1991   [place]     Past Surgical History:  Procedure Laterality Date  . BIospy     . Biospy      Uterus wall  . Carparal Tunnel    . CATARACT EXTRACTION    . Colonscopy    . EYE SURGERY     Right 2011, Left 2008  . LIPOMA EXCISION     FROM BACK   . THUMB FUSION    . TOTAL KNEE ARTHROPLASTY  03/24/2011   Procedure: TOTAL KNEE ARTHROPLASTY;  Surgeon: Hessie Dibble, MD;  Location: [facility];  Service: Orthopedics;  Laterality: Left;  with revision tibial component  . TUBAL LIGATION      Current Medications: Current Meds  Medication Sig  . Ascorbic Acid (VITAMIN C PO) Take 1 tablet by mouth daily.  Marland Kitchen aspirin 81 MG tablet Take 81 mg by mouth daily.  . calcium carbonate (OS-CAL) 600 MG TABS Take 600 mg by mouth daily.  Marland Kitchen CINNAMON PO  Take 500 mg by mouth 2 (two) times daily.  Marland Kitchen escitalopram (LEXAPRO) 10 MG tablet Take 10 mg by mouth daily.  Marland Kitchen ezetimibe (ZETIA) 10 MG tablet Take 10 mg by mouth daily.  . metoprolol tartrate (LOPRESSOR) 25 MG tablet Take 25 mg by mouth 2 (two) times daily.  . Omega-3 Fatty Acids (FISH OIL) 1000 MG CPDR Take 1,000 mg by mouth daily.  Marland Kitchen omeprazole (PRILOSEC) 40 MG capsule Take 40 mg by mouth daily.  Marland Kitchen VITAMIN D, CHOLECALCIFEROL, PO Take 1 tablet by mouth daily.     Allergies:   Sulfa antibiotics; Zocor [simvastatin]; and Food   Social History   Socioeconomic History  . Marital status: Widowed    Spouse name: Not on file  . Number of children: 5  . Years of education: 56  . Highest education level: Not on file  Occupational History  . Occupation: Retired   Scientific laboratory technician  . Financial resource strain: Not on file  . Food insecurity:    Worry: Not on file    Inability: Not on file  .  Transportation needs:    Medical: Not on file    Non-medical: Not on file  Tobacco Use  . Smoking status: Never Smoker  . Smokeless tobacco: Never Used  Substance and Sexual Activity  . Alcohol use: No    Alcohol/week: 0.0 standard drinks  . Drug use: No  . Sexual activity: Not on file  Lifestyle  . Physical activity:    Days per week: Not on file    Minutes per session: Not on file  . Stress: Not on file  Relationships  . Social connections:    Talks on phone: Not on file    Gets together: Not on file    Attends religious service: Not on file    Active member of club or organization: Not on file    Attends meetings of clubs or organizations: Not on file    Relationship status: Not on file  Other Topics Concern  . Not on file  Social History Narrative   Patient lives at home with daughter   Patient is retired   Patient is widowed   Patient has a high school education   Patient has 5 children      Family History: The patient's family history includes CVA in her mother; Cancer in her father; Pulmonary embolism in her brother; Sleep apnea in her brother. There is no history of Anesthesia problems or Breast cancer.  ROS:   Please see the history of present illness.    Vertigo, vision disturbance, easy bruising, chronic low back pain and neck pain.  History of neuropathy.  History of cervical disc fusion secondary to injury and not surgery.  All other systems reviewed and are negative.  EKGs/Labs/Other Studies Reviewed:     The following studies were reviewed today: Doppler echocardiogram 12/23/2017: Study Conclusions  - Left ventricle: The cavity size was normal. There was mild   concentric hypertrophy. Systolic function was vigorous. The   estimated ejection fraction was in the range of 65% to 70%. Wall   motion was normal; there were no regional wall motion   abnormalities. Diastolic function assessment limited due to   severity of mitral annular calcification. -  Mitral valve: Moderately to severely calcified annulus. There was   mild regurgitation. - Left atrium: The atrium was mildly dilated. - Right atrium: The atrium was mildly dilated. - Tricuspid valve: There was mild regurgitation. - Pulmonary arteries: PA peak  pressure: 40 mm Hg (S).  CT head with out contrast 12/24/2017: IMPRESSION: 1. No acute intracranial abnormality. 2. Similar appearance of incompletely imaged soft tissue swelling about the right orbit with hemorrhage in the right maxillary sinus and fracture of the inferior wall of the right orbit. 3. Cerebral atrophy and small vessel ischemic change.  EKG:  EKG performed on December 22, 2017 reveals relatively short PR interval, normal sinus rhythm, poor R wave progression V1 through V3, and otherwise unremarkable.  When compared to March 2013, no significant change has occurred.  Recent Labs: 12/22/2017: ALT 13 12/24/2017: Magnesium 2.0 12/25/2017: BUN 7; Creatinine, Ser 0.86; Hemoglobin 12.8; Platelets 243; Potassium 4.6; Sodium 137  Recent Lipid Panel No results found for: CHOL, TRIG, HDL, CHOLHDL, VLDL, LDLCALC, LDLDIRECT  Physical Exam:    VS:  BP (!) 142/92   Pulse 71   Ht 5' (1.524 m)   Wt 230 lb (104.3 kg)   SpO2 96%   BMI 44.92 kg/m     Wt Readings from Last 3 Encounters:  02/01/18 230 lb (104.3 kg)  12/25/17 236 lb 1.8 oz (107.1 kg)  11/24/13 240 lb (108.9 kg)     GEN: Morbidly obese, elderly female with sweet temperament. No acute distress HEENT: Normal NECK: No JVD. LYMPHATICS: No lymphadenopathy CARDIAC: RRR.  No murmur, no gallop, no edema VASCULAR: 2+ bilateral radial and carotid pulses, no bruits RESPIRATORY:  Clear to auscultation without rales, wheezing or rhonchi  ABDOMEN: Soft, non-tender, non-distended, No pulsatile mass, MUSCULOSKELETAL: No deformity  SKIN: Warm and dry NEUROLOGIC:  Alert and oriented x 3 PSYCHIATRIC:  Normal affect   ASSESSMENT:    1. Syncope, unspecified syncope  type   2. Palpitations   3. Essential hypertension   4. Mixed hyperlipidemia   5. Obstructive sleep apnea    PLAN:    In order of problems listed above:  1. The episode in question led to severe facial injury with orbital fracture.  The description per the patient cannot exclude the possibility of syncope.  Agree that telemetry monitoring for at least 30 days is necessary. 2. Palpitations have been attributed to anxiety, per the patient.  The telemetry unit will help exclude rhythm disturbance that has been mislabeled.  Blood pressure is in good control. 3. Blood pressure target should be 130/80 mmHg.  Currently slightly higher than that on metoprolol tartrate 25 mg twice daily. 4. Hyperlipidemia with LDL in October of 133.  CT scan in December demonstrated microvascular disease on inspection without contrast. 5. She is compliant with CPAP.  There is not much to go on in this situation.  Heart is structurally normal.  No rhythm disturbance was noted on the monitor during her 3-day hospital stay.  I have recommended a 30-day continuous monitor to exclude malignant arrhythmias including ventricular tachycardia, tachybradycardia syndrome with postconversion pauses, and atrial fibrillation.  No specific therapy at this time.  The patient is not driving, her preference.  Further evaluation if necessary based upon results of the monitor.   Medication Adjustments/Labs and Tests Ordered: Current medicines are reviewed at length with the patient today.  Concerns regarding medicines are outlined above.  Orders Placed This Encounter  Procedures  . Cardiac event monitor   No orders of the defined types were placed in this encounter.   Patient Instructions  Medication Instructions:  Your physician recommends that you continue on your current medications as directed. Please refer to the Current Medication list given to you today.  If you need  a refill on your cardiac medications before your  next appointment, please call your pharmacy.   Lab work: None If you have labs (blood work) drawn today and your tests are completely normal, you will receive your results only by: Marland Kitchen MyChart Message (if you have MyChart) OR . A paper copy in the mail If you have any lab test that is abnormal or we need to change your treatment, we will call you to review the results.  Testing/Procedures: Your physician has recommended that you wear an event monitor. Event monitors are medical devices that record the heart's electrical activity. Doctors most often [name] these monitors to diagnose arrhythmias. Arrhythmias are problems with the speed or rhythm of the heartbeat. The monitor is a small, portable device. You can wear one while you do your normal daily activities. This is usually used to diagnose what is causing palpitations/syncope (passing out).   Follow-Up: Your physician recommends that you schedule a follow-up appointment as needed with Dr. Tamala Julian.    Any Other Special Instructions Will Be Listed Below (If Applicable).       Signed, Sinclair Grooms, MD  02/01/2018 10:13 AM    [place]

## 2018-02-01 ENCOUNTER — Encounter: Payer: Self-pay | Admitting: Interventional Cardiology

## 2018-02-01 ENCOUNTER — Ambulatory Visit (INDEPENDENT_AMBULATORY_CARE_PROVIDER_SITE_OTHER): Payer: Medicare Other | Admitting: Interventional Cardiology

## 2018-02-01 VITALS — BP 142/92 | HR 71 | Ht 60.0 in | Wt 230.0 lb

## 2018-02-01 DIAGNOSIS — I10 Essential (primary) hypertension: Secondary | ICD-10-CM

## 2018-02-01 DIAGNOSIS — R002 Palpitations: Secondary | ICD-10-CM

## 2018-02-01 DIAGNOSIS — E782 Mixed hyperlipidemia: Secondary | ICD-10-CM

## 2018-02-01 DIAGNOSIS — G4733 Obstructive sleep apnea (adult) (pediatric): Secondary | ICD-10-CM

## 2018-02-01 DIAGNOSIS — R55 Syncope and collapse: Secondary | ICD-10-CM | POA: Diagnosis not present

## 2018-02-01 NOTE — Patient Instructions (Signed)
Medication Instructions:  Your physician recommends that you continue on your current medications as directed. Please refer to the Current Medication list given to you today.  If you need a refill on your cardiac medications before your next appointment, please call your pharmacy.   Lab work: None If you have labs (blood work) drawn today and your tests are completely normal, you will receive your results only by: . MyChart Message (if you have MyChart) OR . A paper copy in the mail If you have any lab test that is abnormal or we need to change your treatment, we will call you to review the results.  Testing/Procedures: Your physician has recommended that you wear an event monitor. Event monitors are medical devices that record the heart's electrical activity. Doctors most often us these monitors to diagnose arrhythmias. Arrhythmias are problems with the speed or rhythm of the heartbeat. The monitor is a small, portable device. You can wear one while you do your normal daily activities. This is usually used to diagnose what is causing palpitations/syncope (passing out).   Follow-Up: Your physician recommends that you schedule a follow-up appointment as needed with Dr. Smith.    Any Other Special Instructions Will Be Listed Below (If Applicable).    

## 2018-02-10 ENCOUNTER — Ambulatory Visit (INDEPENDENT_AMBULATORY_CARE_PROVIDER_SITE_OTHER): Payer: Medicare Other

## 2018-02-10 DIAGNOSIS — R55 Syncope and collapse: Secondary | ICD-10-CM

## 2018-04-21 ENCOUNTER — Telehealth: Payer: Self-pay

## 2018-04-21 NOTE — Telephone Encounter (Signed)
I contacted the pt in regards to her 04/26/18 appt.  Pt was advised, due to current COVID 19 pandemic, our office is severely reducing in office visits for at least the next 2 weeks, in order to minimize the risk to our patients and healthcare providers.  Patient was offered a video visit on 4/20 at 4 pm and accepted.   Pt understands that although there may be some limitations with this type of visit, we will take all precautions to reduce any security or privacy concerns.  Pt understands that this will be treated like an in office visit and we will file with pt's insurance, and there may be a patient responsible charge related to this service.  Pt's email is [email or web address]. Pt understands that the [employer] webex software must be downloaded and operational on the device pt plans to use for the visit.  EMR has been updated for visit.

## 2018-04-25 ENCOUNTER — Other Ambulatory Visit: Payer: Self-pay

## 2018-04-25 ENCOUNTER — Encounter: Payer: Self-pay | Admitting: Neurology

## 2018-04-25 ENCOUNTER — Ambulatory Visit (INDEPENDENT_AMBULATORY_CARE_PROVIDER_SITE_OTHER): Payer: Medicare Other | Admitting: Neurology

## 2018-04-25 DIAGNOSIS — R202 Paresthesia of skin: Secondary | ICD-10-CM | POA: Diagnosis not present

## 2018-04-25 NOTE — Progress Notes (Signed)
Virtual Visit via Video Note  I connected with Sydney Booth on 04/25/18 at  4:00 PM EDT by a video enabled telemedicine application and verified that I am speaking with the correct person using two identifiers.   I discussed the limitations of evaluation and management by telemedicine and the availability of in person appointments. The patient expressed understanding and agreed to proceed.  The patient is at home, physician in office.  History of Present Illness: Sydney Booth is an 85 year old right-handed white female with a history of chronic dizziness.  The patient was seen through this office in 2015, the etiology of her dizziness was not delineated.  MRI of the brain was relatively unremarkable.  The patient has returned to the office with a new problem.  The patient has had some left foot numbness for several years, she recently has developed some similar numbness on the right.  The numbness does cross the ankle at times.  The patient was followed through a podiatrist, 6 months ago this physician recommended neurologic consultation as he felt that the numbness in the feet was related to the back.  The patient claims that the numbness is associated with some tingling and some achy pain that is present with weightbearing, and she feels better off of her feet.  The patient has also developed some numbness in the left hand that has been present for about a year that will cause more problems at night than during the day, it may keep her awake.  The patient does have some neck and low back pain.  She denies any pain radiating from the low back down the legs on either side.  She reports some mild balance changes, she did have a fall associated with a possible blackout on 22 December 2017.  The patient underwent a cardiology evaluation for this including a 30-day heart monitor, the source of the blackout was never determined.  The patient did have MRI of the brain in December 2019, again no acute changes were  seen.  Minimal white matter changes were noted.  The patient does have a left rotator cuff problem.  She reports no troubles controlling the bowels or the bladder.  She is sent to this office for further evaluation.   Observations/Objective: WebEx evaluation reveals that the patient is alert and cooperative, she is moderately to markedly obese.  The patient has a normal speech pattern, she answers questions appropriately, no aphasia or dysarthria is noted.  The patient has full extraocular movements.  Facial symmetry is present, patient is able to protrude the tongue in the midline with good lateral movements of the tongue.  The patient lacks about 15 degrees of full lateral rotation of the cervical spine to the right, 10 degrees to the left.  The patient has good finger-nose-finger and heel shin bilaterally.  Gait is relatively unremarkable, the patient has difficulty performing tandem gait.  Romberg is negative.  Assessment and Plan: 1.  Paresthesias, both feet and left hand  The patient likely has a left carpal tunnel syndrome but the discomfort in the feet seems to be worse when she is up on her feet, better with rest.  The patient will be set up for nerve conduction studies of both legs and of the left arm, she will have EMG of the left leg.  If a peripheral neuropathy is found, she will have further blood work at that point.  She will need a face-to-face examination in 4 months.   MRI  brain 12/22/17:  IMPRESSION: 1. No acute intracranial abnormality. 2. Right periorbital edema, right floor of orbit fracture, and right maxillary sinus blood fluid level better characterized on maxillofacial CT. 3. Mild progression of chronic microvascular ischemic changes and volume loss of the brain from 2011.  * MRI scan images were reviewed online. I agree with the written report.   Follow Up Instructions: 91-month follow-up with me.   I discussed the assessment and treatment plan with the patient.  The patient was provided an opportunity to ask questions and all were answered. The patient agreed with the plan and demonstrated an understanding of the instructions.   The patient was advised to call back or seek an in-person evaluation if the symptoms worsen or if the condition fails to improve as anticipated.  I provided 30 minutes of non-face-to-face time during this encounter.   Kathrynn Ducking, MD

## 2018-04-26 ENCOUNTER — Ambulatory Visit: Payer: PRIVATE HEALTH INSURANCE | Admitting: Neurology

## 2018-06-08 ENCOUNTER — Encounter: Payer: Self-pay | Admitting: Neurology

## 2018-06-08 ENCOUNTER — Other Ambulatory Visit: Payer: Self-pay

## 2018-06-08 ENCOUNTER — Ambulatory Visit (INDEPENDENT_AMBULATORY_CARE_PROVIDER_SITE_OTHER): Payer: Medicare Other | Admitting: Neurology

## 2018-06-08 DIAGNOSIS — R202 Paresthesia of skin: Secondary | ICD-10-CM

## 2018-06-08 DIAGNOSIS — G603 Idiopathic progressive neuropathy: Secondary | ICD-10-CM

## 2018-06-08 DIAGNOSIS — G6289 Other specified polyneuropathies: Secondary | ICD-10-CM

## 2018-06-08 DIAGNOSIS — G5602 Carpal tunnel syndrome, left upper limb: Secondary | ICD-10-CM

## 2018-06-08 DIAGNOSIS — E538 Deficiency of other specified B group vitamins: Secondary | ICD-10-CM

## 2018-06-08 HISTORY — DX: Carpal tunnel syndrome, left upper limb: G56.02

## 2018-06-08 MED ORDER — GABAPENTIN 100 MG PO CAPS: 100.0000 mg | ORAL_CAPSULE | Freq: Three times a day (TID) | ORAL | 3 refills | Status: DC

## 2018-06-08 NOTE — Progress Notes (Signed)
Please refer to EMG and nerve conduction procedure note.  

## 2018-06-08 NOTE — Procedures (Signed)
     HISTORY:  Sydney Booth is an 85 year old patient with a history of numbness and tingling and discomfort in the left hand as well as some burning sensations in the feet.  She reports some pain with weightbearing on her feet, but she also has some balance issues and sensation of lightheadedness or dizziness while walking.  This goes away when she sits or lies down.  The patient is being evaluated for a possible neuropathy.  NERVE CONDUCTION STUDIES:  Nerve conduction studies were performed on the left upper extremity.  The distal motor latency for the left median nerve was prolonged with a low motor amplitude.  The distal motor latency and motor amplitude for the left ulnar nerve were normal.  Slowing was seen for the left median nerve, normal nerve conduction velocities were seen for the left ulnar nerve.  The sensory latency for the left radial nerve was slightly prolonged, the sensory latency for the left median nerve was unobtainable and the left ulnar sensory latency was slightly prolonged.  The F-wave latency for the left ulnar nerve was normal.  Nerve conduction studies were performed on both lower extremities.  The distal motor latencies for the peroneal and posterior tibial nerves were normal bilaterally but low motor amplitudes were noted for these nerves bilaterally.  Slowing was seen for the right posterior tibial nerve but was normal for the left posterior tibial nerve and for the peroneal nerves bilaterally.  The sural and peroneal sensory latencies were unobtainable bilaterally.  The F-wave latencies for the posterior tibial nerves were within normal limits bilaterally.  EMG STUDIES:  EMG study was performed on the left lower extremity:  The tibialis anterior muscle reveals 2 to 5K motor units with decreased recruitment. No fibrillations or positive waves were seen. The peroneus tertius muscle reveals 2 to 4K motor units with decreased recruitment. No fibrillations or positive waves  were seen. The medial gastrocnemius muscle reveals 1 to 4K motor units with decreased recruitment. No fibrillations or positive waves were seen. The vastus lateralis muscle reveals 2 to 4K motor units with full recruitment. No fibrillations or positive waves were seen. The iliopsoas muscle reveals 2 to 4K motor units with full recruitment. No fibrillations or positive waves were seen. The biceps femoris muscle (long head) reveals 2 to 4K motor units with full recruitment. No fibrillations or positive waves were seen. The lumbosacral paraspinal muscles were tested at 3 levels, and revealed no abnormalities of insertional activity at all 3 levels tested. There was poor relaxation.   IMPRESSION:  Nerve conduction studies done on the left upper extremity and both lower extremities were notable for a primarily axonal peripheral neuropathy of moderate severity.  There is evidence of a left carpal tunnel syndrome of moderate severity.  EMG evaluation of the left lower extremity shows mild chronic distal neuropathic signs of denervation consistent with the diagnosis of peripheral neuropathy.  There is no clear evidence of an overlying lumbosacral radiculopathy.  Jill Alexanders MD 06/08/2018 3:00 PM  [facility] [address] [place], [place] [address]  Phone [phone] Fax [phone]

## 2018-06-08 NOTE — Progress Notes (Signed)
The patient comes in for EMG nerve conduction study evaluation today.  The nerve conductions show evidence of a left carpal tunnel syndrome of moderate severity and evidence of an overlying primarily axonal peripheral neuropathy of moderate severity.  The patient will be sent for blood work today, she will be given a prescription for a wrist pain to the left wrist, if her left hand is not getting better within the next 8 weeks, she will call our office and we may consider a referral to a hand surgeon.  A prescription was sent in for low-dose gabapentin, the patient will call for any dose adjustments.  She reports pain in her feet with weightbearing, this likely is related to a mechanical source pain rather than the peripheral neuropathy.

## 2018-06-09 LAB — MULTIPLE MYELOMA PANEL, SERUM

## 2018-06-10 LAB — MULTIPLE MYELOMA PANEL, SERUM
Albumin SerPl Elph-Mcnc: 3.5 g/dL (ref 2.9–4.4)
Albumin/Glob SerPl: 1.1 (ref 0.7–1.7)
Alpha 1: 0.3 g/dL (ref 0.0–0.4)
Alpha2 Glob SerPl Elph-Mcnc: 0.8 g/dL (ref 0.4–1.0)
B-Globulin SerPl Elph-Mcnc: 1.4 g/dL — ABNORMAL HIGH (ref 0.7–1.3)
Gamma Glob SerPl Elph-Mcnc: 1 g/dL (ref 0.4–1.8)
Globulin, Total: 3.4 g/dL (ref 2.2–3.9)
IgA/Immunoglobulin A, Serum: 314 mg/dL (ref 64–422)
IgG (Immunoglobin G), Serum: 1023 mg/dL (ref 586–1602)
IgM (Immunoglobulin M), Srm: 121 mg/dL (ref 26–217)
Total Protein: 6.9 g/dL (ref 6.0–8.5)

## 2018-06-10 LAB — RHEUMATOID FACTOR: Rheumatoid fact SerPl-aCnc: <10 IU/mL (ref 0.0–13.9)

## 2018-06-10 LAB — B. BURGDORFI ANTIBODIES: Lyme IgG/IgM Ab: <0.91 {ISR} (ref 0.00–0.90)

## 2018-06-10 LAB — VITAMIN B12: Vitamin B-12: 901 pg/mL (ref 232–1245)

## 2018-06-10 LAB — ANGIOTENSIN CONVERTING ENZYME: Angio Convert Enzyme: 80 U/L (ref 14–82)

## 2018-06-10 LAB — ANA W/REFLEX: Anti Nuclear Antibody (ANA): NEGATIVE

## 2018-06-13 ENCOUNTER — Telehealth: Payer: Self-pay

## 2018-06-13 NOTE — Telephone Encounter (Signed)
I contacted the pt and I was able to advise on unremarkable abs. Pt verbalized understanding and did not have any questions.

## 2018-06-13 NOTE — Telephone Encounter (Signed)
-----   Message from Kathrynn Ducking, MD sent at 06/12/2018  3:38 PM EDT -----  The blood work results are unremarkable. Please call the patient. ----- Message ----- From: Lavone Neri Lab Results In Sent: 06/09/2018   7:37 AM EDT To: Kathrynn Ducking, MD

## 2018-10-12 ENCOUNTER — Ambulatory Visit (INDEPENDENT_AMBULATORY_CARE_PROVIDER_SITE_OTHER): Payer: Medicare Other | Admitting: Neurology

## 2018-10-12 ENCOUNTER — Encounter: Payer: Self-pay | Admitting: Neurology

## 2018-10-12 ENCOUNTER — Other Ambulatory Visit: Payer: Self-pay

## 2018-10-12 VITALS — BP 179/91 | HR 69 | Temp 97.8°F | Ht 60.0 in | Wt 228.0 lb

## 2018-10-12 DIAGNOSIS — R42 Dizziness and giddiness: Secondary | ICD-10-CM | POA: Diagnosis not present

## 2018-10-12 DIAGNOSIS — G6289 Other specified polyneuropathies: Secondary | ICD-10-CM

## 2018-10-12 MED ORDER — DULOXETINE HCL 20 MG PO CPEP: 20.0000 mg | ORAL_CAPSULE | Freq: Every day | ORAL | 2 refills | Status: DC

## 2018-10-12 NOTE — Progress Notes (Signed)
Reason for visit: Dizziness, hand and foot pain  Sydney Booth is an 85 y.o. female  History of present illness:  Ms. Hootman is a 85 year old right-handed white female with a history of dizziness for greater than 5 years.  The patient has taken meclizine without complete control in the past.  MRI of the brain done within the last year does show mild small vessel ischemic changes, no clear etiology for her dizziness is been noted.  She has never gone through vestibular rehab per se.  She indicates that she does have bilateral foot pain, she has been shown to have a peripheral neuropathy and evidence of left carpal tunnel syndrome.  She has what appears to be mainly joint pain in the MP joint for the index finger bilaterally, left greater than right.  This is also bothersome to her.  She uses a cane for ambulation, she has not had any recent falls.  She returns for an evaluation.  She was given a trial on gabapentin but could not tolerate it secondary to increased dizziness and spaciness.  Past Medical History:  Diagnosis Date  . Arthritis   . Blood transfusion   . Cervical vertebral fracture (HCC)    fued on its on  . Closed head injury    as a chlid  . Fall 12/22/2017   right eye bruised  . Gastric ulcer   . GERD (gastroesophageal reflux disease)   . H/O hiatal hernia   . Headache(784.0)   . Hyperlipemia   . Hypertension   . Left carpal tunnel syndrome 06/08/2018  . Mitral valve prolapse   . Obesity   . OSA on CPAP   . Osteopenia   . Rotator cuff tear    Left  . Seasonal allergies   . Shortness of breath    With activity  . Ulcer 1991   [facility]     Past Surgical History:  Procedure Laterality Date  . BIospy     . Biospy      Uterus wall  . Carparal Tunnel    . CATARACT EXTRACTION    . Colonscopy    . EYE SURGERY     Right 2011, Left 2008  . LIPOMA EXCISION     FROM BACK   . THUMB FUSION    . TOTAL KNEE ARTHROPLASTY  03/24/2011   Procedure: TOTAL KNEE ARTHROPLASTY;   Surgeon: Hessie Dibble, MD;  Location: [facility];  Service: Orthopedics;  Laterality: Left;  with revision tibial component  . TUBAL LIGATION      Family History  Problem Relation Age of Onset  . CVA Mother   . Cancer Father        COLON, LIVER AND PANCREAS  . Sleep apnea Brother   . Pulmonary embolism Brother   . Anesthesia problems Neg Hx   . Breast cancer Neg Hx     Social history:  reports that she has never smoked. She has never used smokeless tobacco. She reports that she does not drink alcohol or use drugs.    Allergies  Allergen Reactions  . Sulfa Antibiotics Hives  . Zocor [Simvastatin]     Muscle aches    Medications:  Prior to Admission medications   Medication Sig Start Date End Date Taking? Authorizing Provider  aspirin 81 MG tablet Take 81 mg by mouth daily.   Yes [provider]  calcium carbonate (OS-CAL) 600 MG TABS Take 600 mg by mouth daily.   Yes [provider]  CINNAMON PO Take 500 mg by mouth 2 (two) times daily.   Yes [provider]  ezetimibe (ZETIA) 10 MG tablet Take 10 mg by mouth daily.   Yes [provider]  metoprolol tartrate (LOPRESSOR) 25 MG tablet Take 25 mg by mouth 2 (two) times daily.   Yes [provider]  Omega-3 Fatty Acids (FISH OIL) 1000 MG CPDR Take 1,000 mg by mouth daily.   Yes [provider]  omeprazole (PRILOSEC) 40 MG capsule Take 40 mg by mouth daily.   Yes [provider]  QUERCETIN PO Take 1,500 mg by mouth daily.   Yes [provider]  VITAMIN A PO Take by mouth.   Yes [provider]  vitamin B-12 (CYANOCOBALAMIN) 1000 MCG tablet Take 1,000 mcg by mouth daily.   Yes [provider]  VITAMIN D, CHOLECALCIFEROL, PO Take 1 tablet by mouth daily.   Yes [provider]    ROS:  Out of a complete 14 system review of symptoms, the patient complains only of the following symptoms, and all other reviewed systems are negative.   Joint pains Dizziness, walking difficulty Foot pain  Blood pressure (!) 179/91, pulse 69, temperature 97.8 F (36.6 C), temperature source Temporal, height 5' (1.524 m), weight 228 lb (103.4 kg).  Physical Exam  General: The patient is alert and cooperative at the time of the examination.  The patient is moderately to markedly obese.  Skin: No significant peripheral edema is noted.   Neurologic Exam  Mental status: The patient is alert and oriented x 3 at the time of the examination. The patient has apparent normal recent and remote memory, with an apparently normal attention span and concentration ability.   Cranial nerves: Facial symmetry is present. Speech is normal, no aphasia or dysarthria is noted. Extraocular movements are full. Visual fields are full.  Motor: The patient has good strength in all 4 extremities.  Sensory examination: Soft touch sensation is symmetric on the face, arms, and legs.  Coordination: The patient has good finger-nose-finger and heel-to-shin bilaterally.  Gait and station: The patient has a slightly wide-based gait, the patient can walk independently but usually uses a cane. Romberg is negative. No drift is seen.  Reflexes: Deep tendon reflexes are symmetric.   MRI brain 12/22/17:  IMPRESSION: 1. No acute intracranial abnormality. 2. Right periorbital edema, right floor of orbit fracture, and right maxillary sinus blood fluid level better characterized on maxillofacial CT. 3. Mild progression of chronic microvascular ischemic changes and volume loss of the brain from 2011.  * MRI scan images were reviewed online. I agree with the written report.    Assessment/Plan:  1.  Chronic dizziness  2.  Peripheral neuropathy  3.  Left carpal tunnel syndrome  4.  Hand pain, degenerative arthritis  The patient will be placed on low-dose Cymbalta for her foot discomfort, she is having a lot of discomfort in the hands, she may try a course of  Aleve twice daily to see if this improves her discomfort.  The patient can be sent for vestibular rehabilitation if she has transportation, she will call me if she can arrange for this.  Otherwise, I will see patient back in 6 months.  Jill Alexanders MD 10/12/2018 10:20 AM  [facility] [address] [place], [place] [address]  Phone [phone] Fax [phone]

## 2018-10-19 ENCOUNTER — Telehealth: Payer: Self-pay | Admitting: Neurology

## 2018-10-19 DIAGNOSIS — R42 Dizziness and giddiness: Secondary | ICD-10-CM

## 2018-10-19 NOTE — Telephone Encounter (Deleted)
Pt is agreeable to starting vertigo therapy.

## 2018-10-19 NOTE — Telephone Encounter (Signed)
Pt called stating that she is ready to start her Vertigo therapy. Please advise.

## 2018-10-19 NOTE — Telephone Encounter (Signed)
I will place the order for vestibular rehabilitation.

## 2018-10-28 ENCOUNTER — Encounter (INDEPENDENT_AMBULATORY_CARE_PROVIDER_SITE_OTHER): Payer: Self-pay

## 2018-12-08 ENCOUNTER — Encounter: Payer: Self-pay | Admitting: Physical Therapy

## 2018-12-08 ENCOUNTER — Other Ambulatory Visit: Payer: Self-pay

## 2018-12-08 ENCOUNTER — Ambulatory Visit: Payer: Medicare Other | Attending: Neurology | Admitting: Physical Therapy

## 2018-12-08 DIAGNOSIS — H8111 Benign paroxysmal vertigo, right ear: Secondary | ICD-10-CM | POA: Diagnosis not present

## 2018-12-08 DIAGNOSIS — R262 Difficulty in walking, not elsewhere classified: Secondary | ICD-10-CM | POA: Insufficient documentation

## 2018-12-08 DIAGNOSIS — R2681 Unsteadiness on feet: Secondary | ICD-10-CM | POA: Insufficient documentation

## 2018-12-08 DIAGNOSIS — R42 Dizziness and giddiness: Secondary | ICD-10-CM | POA: Insufficient documentation
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
## 2018-12-09 ENCOUNTER — Telehealth: Payer: Self-pay | Admitting: Neurology

## 2018-12-09 NOTE — Telephone Encounter (Signed)
Pt is asking that a prescription for a 90 day supply of DULoxetine (CYMBALTA) 20 MG capsule be sent to [facility]

## 2018-12-12 ENCOUNTER — Encounter: Payer: Self-pay | Admitting: Physical Therapy

## 2018-12-12 MED ORDER — DULOXETINE HCL 20 MG PO CPEP: 20.0000 mg | ORAL_CAPSULE | Freq: Every day | ORAL | 2 refills | Status: DC

## 2018-12-12 NOTE — Addendum Note (Signed)
Addended by: Verlin Grills T on: 12/12/2018 08:45 AM   Modules accepted: Orders

## 2018-12-12 NOTE — Telephone Encounter (Signed)
Chart reviewed and rx is appropriate. Refill sent electronically.

## 2018-12-29 ENCOUNTER — Encounter: Payer: Self-pay | Admitting: Physical Therapy

## 2018-12-29 ENCOUNTER — Other Ambulatory Visit: Payer: Self-pay

## 2018-12-29 ENCOUNTER — Ambulatory Visit: Payer: Medicare Other | Admitting: Physical Therapy

## 2018-12-29 DIAGNOSIS — R42 Dizziness and giddiness: Secondary | ICD-10-CM

## 2018-12-29 DIAGNOSIS — R2681 Unsteadiness on feet: Secondary | ICD-10-CM

## 2018-12-29 DIAGNOSIS — R262 Difficulty in walking, not elsewhere classified: Secondary | ICD-10-CM

## 2018-12-29 DIAGNOSIS — H8111 Benign paroxysmal vertigo, right ear: Secondary | ICD-10-CM | POA: Diagnosis not present

## 2018-12-29 NOTE — Patient Instructions (Signed)
Habituation - Tip Card  1.The goal of habituation training is to assist in decreasing symptoms of vertigo, dizziness, or nausea provoked by specific head and body motions. 2.These exercises may initially increase symptoms; however, be persistent and work through symptoms. With repetition and time, the exercises will assist in reducing or eliminating symptoms. 3.Exercises should be stopped and discussed with the therapist if you experience any of the following: - Sudden change or fluctuation in hearing - New onset of ringing in the ears, or increase in current intensity - Any fluid discharge from the ear - Severe pain in neck or back - Extreme nausea  Copyright  VHI. All rights reserved.   Habituation - Rolling   With pillow under head, start on back. Roll to your right side.  Hold until dizziness stops, plus 20 seconds and then roll to the left side.  Hold until dizziness stops, plus 20 seconds.  Repeat sequence 3 times per session. Do 2 sessions per day.   Gaze Stabilization: Sitting    Keeping eyes on target on wall 3 feet away, and move head side to side for _60___ seconds. Repeat while moving head up and down for __60__ seconds. Do __2-3__ sessions per day.  Copyright  VHI. All rights reserved.   Gaze Stabilization: Tip Card  1.Target must remain in focus, not blurry, and appear stationary while head is in motion. 2.Perform exercises with small head movements (45 to either side of midline). 3.Increase speed of head motion so long as target is in focus. 4.If you wear eyeglasses, be sure you can see target through lens (therapist will give specific instructions for bifocal / progressive lenses). 5.These exercises may provoke dizziness or nausea. Work through these symptoms. If too dizzy, slow head movement slightly. Rest between each exercise. 6.Exercises demand concentration; avoid distractions.  Copyright  VHI. All rights reserved.

## 2018-12-29 NOTE — Therapy (Signed)
[facility] [address] [place], [place], [address] Phone: [phone]   Fax:  [phone]  Physical Therapy Treatment  Patient Details  Name: Sydney Booth MRN: [place]:[id number] Date of Birth: Nov 07, 1931 Referring Provider (PT): Kathrynn Ducking, MD   Encounter Date: 12/29/2018  PT End of Session - 12/29/18 1203    Visit Number  2    Number of Visits  9    Date for PT Re-Evaluation  02/06/19    Authorization Type  Medicare and AARP - 10th visit PN    PT Start Time  0802    PT Stop Time  0847    PT Time Calculation (min)  45 min    Activity Tolerance  Patient tolerated treatment well    Behavior During Therapy  [facility] for tasks assessed/performed       Past Medical History:  Diagnosis Date  . Arthritis   . Blood transfusion   . Cervical vertebral fracture (HCC)    fued on its on  . Closed head injury    as a chlid  . Fall 12/22/2017   right eye bruised  . Gastric ulcer   . GERD (gastroesophageal reflux disease)   . H/O hiatal hernia   . Headache(784.0)   . Hyperlipemia   . Hypertension   . Left carpal tunnel syndrome 06/08/2018  . Mitral valve prolapse   . Obesity   . OSA on CPAP   . Osteopenia   . Rotator cuff tear    Left  . Seasonal allergies   . Shortness of breath    With activity  . Ulcer 1991   [place]     Past Surgical History:  Procedure Laterality Date  . BIospy     . Biospy      Uterus wall  . Carparal Tunnel    . CATARACT EXTRACTION    . Colonscopy    . EYE SURGERY     Right 2011, Left 2008  . LIPOMA EXCISION     FROM BACK   . THUMB FUSION    . TOTAL KNEE ARTHROPLASTY  03/24/2011   Procedure: TOTAL KNEE ARTHROPLASTY;  Surgeon: Hessie Dibble, MD;  Location: [facility];  Service: Orthopedics;  Laterality: Left;  with revision tibial component  . TUBAL LIGATION      There were no vitals filed for this visit.  Subjective Assessment - 12/29/18 0807    Subjective  States feeling dizzy  since the evaluation - no changes, has stayed the same. No falls, nausea or vomiting.    Patient is accompained by:  Family member    Pertinent History  ulcer, SOB, L rotator cuff tear, osteopenia OSA on CPAP, obesity, mitral valve prolapse, L carpal tunnel syndrome, HTN, HLD, HA, fall with closed head injury, TKA, thumb fusion, cataract extraction, OA, and cervical degeneration (pt reports a previous cervical vertebral fracture that fused on its own)    Diagnostic tests  previous imaging after fall with concussion    Patient Stated Goals  get rid of the dizziness    Currently in Pain?  No/denies             Vestibular Assessment - 12/29/18 0808      Positional Testing   Horizontal Canal Testing  Horizontal Canal Right;Horizontal Canal Left      Dix-Hallpike Right   Dix-Hallpike Right Duration  0    Dix-Hallpike Right Symptoms  No nystagmus      Dix-Hallpike Left  [facility] [address] [place], [place], [address] Phone: [phone]   Fax:  [phone]  Name: Sydney Booth MRN: [place]:[id number] Date of Birth: Mar 30, 1931  [facility] [address] [place], [place], [address] Phone: [phone]   Fax:  [phone]  Name: Sydney Booth MRN: [place]:[id number] Date of Birth: Mar 30, 1931  [facility] [address] [place], [place], [address] Phone: [phone]   Fax:  [phone]  Name: Sydney Booth MRN: [place]:[id number] Date of Birth: Mar 30, 1931

## 2019-01-02 MED ORDER — DULOXETINE HCL 20 MG PO CPEP: 20.0000 mg | ORAL_CAPSULE | Freq: Every day | ORAL | 2 refills | Status: DC

## 2019-01-02 NOTE — Telephone Encounter (Signed)
Patient called in and stated her meds were sent to the incorrect pharmacy, she states it needs to be sent to [facility], [place]

## 2019-01-02 NOTE — Telephone Encounter (Signed)
Cymbalta has been re submitted to [facility].

## 2019-01-02 NOTE — Addendum Note (Signed)
Addended by: Verlin Grills T on: 01/02/2019 10:20 AM   Modules accepted: Orders

## 2019-01-05 ENCOUNTER — Encounter

## 2019-01-09 ENCOUNTER — Other Ambulatory Visit: Payer: Self-pay

## 2019-01-09 ENCOUNTER — Ambulatory Visit: Payer: Medicare Other | Attending: Neurology | Admitting: Physical Therapy

## 2019-01-09 DIAGNOSIS — R42 Dizziness and giddiness: Secondary | ICD-10-CM | POA: Insufficient documentation

## 2019-01-09 DIAGNOSIS — R2681 Unsteadiness on feet: Secondary | ICD-10-CM | POA: Diagnosis present

## 2019-01-10 ENCOUNTER — Encounter: Payer: Self-pay | Admitting: Physical Therapy

## 2019-01-10 NOTE — Therapy (Signed)
[facility] [address] [place], [place], [address] Phone: [phone]   Fax:  [phone]  Physical Therapy Treatment  Patient Details  Name: Sydney Booth MRN: [id number] Date of Birth: 1931/09/09 Referring Provider (PT): Kathrynn Ducking, MD   Encounter Date: 01/09/2019  PT End of Session - 01/10/19 2044    Visit Number  3    Number of Visits  9    Date for PT Re-Evaluation  02/06/19    Authorization Type  Medicare and AARP - 10th visit PN    PT Start Time  0850    PT Stop Time  0933    PT Time Calculation (min)  43 min       Past Medical History:  Diagnosis Date  . Arthritis   . Blood transfusion   . Cervical vertebral fracture (HCC)    fued on its on  . Closed head injury    as a chlid  . Fall 12/22/2017   right eye bruised  . Gastric ulcer   . GERD (gastroesophageal reflux disease)   . H/O hiatal hernia   . Headache(784.0)   . Hyperlipemia   . Hypertension   . Left carpal tunnel syndrome 06/08/2018  . Mitral valve prolapse   . Obesity   . OSA on CPAP   . Osteopenia   . Rotator cuff tear    Left  . Seasonal allergies   . Shortness of breath    With activity  . Ulcer 1991   [facility]     Past Surgical History:  Procedure Laterality Date  . BIospy     . Biospy      Uterus wall  . Carparal Tunnel    . CATARACT EXTRACTION    . Colonscopy    . EYE SURGERY     Right 2011, Left 2008  . LIPOMA EXCISION     FROM BACK   . THUMB FUSION    . TOTAL KNEE ARTHROPLASTY  03/24/2011   Procedure: TOTAL KNEE ARTHROPLASTY;  Surgeon: Hessie Dibble, MD;  Location: [facility];  Service: Orthopedics;  Laterality: Left;  with revision tibial component  . TUBAL LIGATION      There were no vitals filed for this visit.  Subjective Assessment - 01/10/19 1347    Subjective  Pt states she only gets dizzy if she moves quickly or if she bends over    Pertinent History  ulcer, SOB, L rotator cuff tear, osteopenia OSA  on CPAP, obesity, mitral valve prolapse, L carpal tunnel syndrome, HTN, HLD, HA, fall with closed head injury, TKA, thumb fusion, cataract extraction, OA, and cervical degeneration (pt reports a previous cervical vertebral fracture that fused on its own)    Currently in Pain?  No/denies                        Vestibular Treatment/Exercise - 01/10/19 0001      Vestibular Treatment/Exercise   Vestibular Treatment Provided  Habituation    Habituation Exercises  Horizontal Roll   5 reps to each side - no waiting between sides   Gaze Exercises  X1 Viewing Horizontal;X1 Viewing Vertical      Horizontal Roll   Number of Reps   5    Symptom Description   mild symptoms with rolling to Rt side      X1 Viewing Horizontal   Foot Position  bil. stance    Time  --  vision 11/24/2013  . Mixed hyperlipidemia 02/09/2013  . 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 HTN (hypertension) 03/24/2011    Class: Chronic    Cathye Kreiter, [place], PT 01/10/2019, 8:54 PM  [facility] [address] [place], [place], [address] Phone: [phone]   Fax:  [phone]  Name: Sydney Booth MRN: [id number] Date of Birth: 1931-10-10  [facility] [address] [place], [place], [address] Phone: [phone]   Fax:  [phone]  Physical Therapy Treatment  Patient Details  Name: Sydney Booth MRN: [id number] Date of Birth: 1931/09/09 Referring Provider (PT): Kathrynn Ducking, MD   Encounter Date: 01/09/2019  PT End of Session - 01/10/19 2044    Visit Number  3    Number of Visits  9    Date for PT Re-Evaluation  02/06/19    Authorization Type  Medicare and AARP - 10th visit PN    PT Start Time  0850    PT Stop Time  0933    PT Time Calculation (min)  43 min       Past Medical History:  Diagnosis Date  . Arthritis   . Blood transfusion   . Cervical vertebral fracture (HCC)    fued on its on  . Closed head injury    as a chlid  . Fall 12/22/2017   right eye bruised  . Gastric ulcer   . GERD (gastroesophageal reflux disease)   . H/O hiatal hernia   . Headache(784.0)   . Hyperlipemia   . Hypertension   . Left carpal tunnel syndrome 06/08/2018  . Mitral valve prolapse   . Obesity   . OSA on CPAP   . Osteopenia   . Rotator cuff tear    Left  . Seasonal allergies   . Shortness of breath    With activity  . Ulcer 1991   [facility]     Past Surgical History:  Procedure Laterality Date  . BIospy     . Biospy      Uterus wall  . Carparal Tunnel    . CATARACT EXTRACTION    . Colonscopy    . EYE SURGERY     Right 2011, Left 2008  . LIPOMA EXCISION     FROM BACK   . THUMB FUSION    . TOTAL KNEE ARTHROPLASTY  03/24/2011   Procedure: TOTAL KNEE ARTHROPLASTY;  Surgeon: Hessie Dibble, MD;  Location: [facility];  Service: Orthopedics;  Laterality: Left;  with revision tibial component  . TUBAL LIGATION      There were no vitals filed for this visit.  Subjective Assessment - 01/10/19 1347    Subjective  Pt states she only gets dizzy if she moves quickly or if she bends over    Pertinent History  ulcer, SOB, L rotator cuff tear, osteopenia OSA  on CPAP, obesity, mitral valve prolapse, L carpal tunnel syndrome, HTN, HLD, HA, fall with closed head injury, TKA, thumb fusion, cataract extraction, OA, and cervical degeneration (pt reports a previous cervical vertebral fracture that fused on its own)    Currently in Pain?  No/denies                        Vestibular Treatment/Exercise - 01/10/19 0001      Vestibular Treatment/Exercise   Vestibular Treatment Provided  Habituation    Habituation Exercises  Horizontal Roll   5 reps to each side - no waiting between sides   Gaze Exercises  X1 Viewing Horizontal;X1 Viewing Vertical      Horizontal Roll   Number of Reps   5    Symptom Description   mild symptoms with rolling to Rt side      X1 Viewing Horizontal   Foot Position  bil. stance    Time  --

## 2019-01-16 ENCOUNTER — Other Ambulatory Visit: Payer: Self-pay

## 2019-01-16 ENCOUNTER — Ambulatory Visit: Payer: Medicare Other | Admitting: Physical Therapy

## 2019-01-16 ENCOUNTER — Encounter: Payer: Self-pay | Admitting: Physical Therapy

## 2019-01-16 DIAGNOSIS — R42 Dizziness and giddiness: Secondary | ICD-10-CM

## 2019-01-16 DIAGNOSIS — R2681 Unsteadiness on feet: Secondary | ICD-10-CM

## 2019-01-17 NOTE — Therapy (Signed)
[facility] [address] [place], [place], [address] Phone: [phone]   Fax:  [phone]  Physical Therapy Treatment  Patient Details  Name: Sydney Booth MRN: [id number] Date of Birth: [birth year] Referring Provider (PT): Kathrynn Ducking, MD   Encounter Date: 01/16/2019  PT End of Session - 01/17/19 2035    Visit Number  4    Number of Visits  9    Date for PT Re-Evaluation  02/06/19    Authorization Type  Medicare and AARP - 10th visit PN    PT Start Time  0845    PT Stop Time  0928    PT Time Calculation (min)  43 min    Activity Tolerance  Patient tolerated treatment well    Behavior During Therapy  [facility] for tasks assessed/performed       Past Medical History:  Diagnosis Date  . Arthritis   . Blood transfusion   . Cervical vertebral fracture (HCC)    fued on its on  . Closed head injury    as a chlid  . Fall 12/22/2017   right eye bruised  . Gastric ulcer   . GERD (gastroesophageal reflux disease)   . H/O hiatal hernia   . Headache(784.0)   . Hyperlipemia   . Hypertension   . Left carpal tunnel syndrome 06/08/2018  . Mitral valve prolapse   . Obesity   . OSA on CPAP   . Osteopenia   . Rotator cuff tear    Left  . Seasonal allergies   . Shortness of breath    With activity  . Ulcer 1991   [facility]     Past Surgical History:  Procedure Laterality Date  . BIospy     . Biospy      Uterus wall  . Carparal Tunnel    . CATARACT EXTRACTION    . Colonscopy    . EYE SURGERY     Right 2011, Left 2008  . LIPOMA EXCISION     FROM BACK   . THUMB FUSION    . TOTAL KNEE ARTHROPLASTY  03/24/2011   Procedure: TOTAL KNEE ARTHROPLASTY;  Surgeon: Hessie Dibble, MD;  Location: [facility];  Service: Orthopedics;  Laterality: Left;  with revision tibial component  . TUBAL LIGATION      There were no vitals filed for this visit.  Subjective Assessment - 01/17/19 2029    Subjective  Pt states her left knee  really bothered her after previous tx session due to standing activities; pt is requesting discharge from PT - stating she doesn't feel that it is benefitting her at this time; thought that the vestibular rehab would be different than what she had in [place] - says her knee can't tolerate the standing    Pertinent History  ulcer, SOB, L rotator cuff tear, osteopenia OSA on CPAP, obesity, mitral valve prolapse, L carpal tunnel syndrome, HTN, HLD, HA, fall with closed head injury, TKA, thumb fusion, cataract extraction, OA, and cervical degeneration (pt reports a previous cervical vertebral fracture that fused on its own)    Currently in Pain?  Yes    Pain Score  4     Pain Location  Knee    Pain Orientation  Left    Pain Descriptors / Indicators  Aching;Discomfort;Sore    Pain Type  Chronic pain    Pain Onset  More than a month ago    Pain Frequency  Intermittent    Aggravating  Factors   standing and walking            Self Care; discussed STG's and LTG's as pt states she wishes to be discharged from PT - states it is not benefitting her at this time Pt states she thought she could have Epley maneuver for her dizziness so that it could be quickly resolved but pt does not have BPPV at this time Pt was informed that her dizziness appears to be of central etiology at this time  Discussed aquatic therapy as pt states she is unable to tolerate the standing exercises on land as they caused increased Lt knee pain - pt declines Aquatic therapy at this time   Pt states she will continue with the exercises at home on her own (habituation) and some of the balance exs. as she is able to tolerate  Pt was given info on [facility] class - offered on the local cable TV channel Mon - Fri at 8:00 am and 1:00 pm   Neuro Re-ed; reviewed HEP for habituation                 PT Education - 01/17/19 2034    Education Details  gave pt info on [facility] on local cable channel; discussed aquatic therapy but pt  declines at this time    Person(s) Educated  Patient    Methods  Explanation    Comprehension  Verbalized understanding       PT Short Term Goals - 01/17/19 2036      PT SHORT TERM GOAL #1   Title  Pt will initiate vestibular and balance HEP    Baseline  met 01-16-19    Time  4    Period  Weeks    Status  Achieved    Target Date  01/07/19      PT SHORT TERM GOAL #2   Title  Pt will complete MSQ and will initiate habituation training    Time  4    Period  Weeks    Status  Deferred    Target Date  01/07/19        PT Long Term Goals - 01/17/19 2039      PT LONG TERM GOAL #1   Title  Pt will demonstrate independence with final balance and vestibular HEP    Baseline  pt requesting D/C from PT after 4 visits - reports increased Lt knee pain due to decreased standing tolerance required for standing balance exercises    Time  8    Period  Weeks    Status  Deferred      PT LONG TERM GOAL #2   Title  Pt will report no dizziness with rolling in bed, supine <> sit and turning in standing    Baseline  pt reports improvement in dizziness with rolling - continues to do habituation exercises    Time  8    Period  Weeks    Status  Partially Met      PT LONG TERM GOAL #3   Title  Pt will demonstrate negative positional testing for peripheral canals    Baseline  no nystagmus noted with any positional testing - pt does not have BPPV at this time - 01-16-19    Time  8    Period  Weeks    Status  Deferred      PT LONG TERM GOAL #4   Title  Pt will report return to driving short distances in town to  go to doctor appointments    Baseline  pt states she will not attempt driving in [place] due to the heavy traffic - 01-16-19    Time  8    Period  Weeks    Status  Deferred            Plan - 01/17/19 2043    Clinical Impression Statement  Pt is requesting D/C from PT at this time due to inability to tolerate standing balance activities due to incr. Lt knee pain experienced after  performing standing exercises in previous Pt session.  Pt has no signs or symptoms of BPPV at this time with no nystagmus noted with any positional testing.  Pt reports she thought Epley maneuver could be performed to resolve her vertigo but pt does not have signs or symptoms consistent with BPPV.  Aquatic therapy was recommended to pt but she declines this therapy also at this time.  Pt is discharged due to her request.    Personal Factors and Comorbidities  Age;Comorbidity 3+;Transportation    Comorbidities  ulcer, SOB, L rotator cuff tear, osteopenia OSA on CPAP, obesity, mitral valve prolapse, L carpal tunnel syndrome, HTN, HLD, HA, fall with closed head injury, TKA, thumb fusion, cataract extraction, OA, and cervical degeneration (pt reports a previous cervical vertebral fracture that fused on its own)    Examination-Activity Limitations  Bed Mobility;Bend;Locomotion Level;Stand    Examination-Participation Restrictions  Driving    Stability/Clinical Decision Making  Stable/Uncomplicated    Rehab Potential  Good    PT Frequency  1x / week    PT Duration  8 weeks    PT Treatment/Interventions  ADLs/Self Care Home Management;Canalith Repostioning;Gait training;Functional mobility training;Therapeutic activities;Therapeutic exercise;Balance training;Neuromuscular re-education;Patient/family education;Vestibular    PT Next Visit Plan  D/C per pt's request    PT Home Exercise Plan  x1 viewing and standing balance exs.; habituation for dizziness    Consulted and Agree with Plan of Care  Patient       Patient will benefit from skilled therapeutic intervention in order to improve the following deficits and impairments:  Decreased balance, Difficulty walking, Dizziness  Visit Diagnosis: Dizziness and giddiness  Unsteadiness on feet     Problem List Patient Active Problem List   Diagnosis Date Noted  . Left carpal tunnel syndrome 06/08/2018  . Peripheral neuropathy   . Closed fracture of  left orbital floor ([facility])   . Concussion with loss of consciousness   . Syncope   . Fall 12/22/2017  . Dizzy 11/24/2013  . Blurred vision 11/24/2013  . Mixed hyperlipidemia 02/09/2013  . 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 HTN (hypertension) 03/24/2011    Class: Chronic    PHYSICAL THERAPY DISCHARGE SUMMARY  Visits from Start of Care: 4  Current functional level related to goals / functional outcomes: See above for progress towards goals - goals not met as pt is requesting D/C from PT due to inability to tolerate standing exs. Due to increased Lt knee pain experienced after previous PT session - pt states she is unable to perform prolonged standing due to Lt knee pain experienced; pt states she does not feel that the PT is going to be beneficial for her - had wanted Epley maneuver to quickly resolve the vertigo but pt does not have signs/symptoms of BPPV at current time;  Pt informed that her dizziness appears to be of central vestibular dysfunction   Remaining deficits: Cont. C/o dizziness - pt does not  have signs/symptoms of BPPV at this time as no nystagmus noted with any positional testing Cont standing balance deficits with c/o knee pain with decreased standing tolerance   Education / Equipment: Pt has been instructed in HEP for habituation, x1 viewing and standing balance exercises; pt has been given info on [facility] ex. Program on local cable TV channel Plan: Patient agrees to discharge.  Patient goals were not met. Patient is being discharged due to the patient's request.  ?????         Lamanda, Rudder, PT 01/17/2019, 8:52 PM  [facility] [address] [place], [place], [address] Phone: [phone]   Fax:  [phone]  Name: Sydney Booth MRN: [id number] Date of Birth: May 02, 1931

## 2019-01-25 ENCOUNTER — Encounter: Payer: PRIVATE HEALTH INSURANCE | Admitting: Physical Therapy

## 2019-01-30 ENCOUNTER — Other Ambulatory Visit: Payer: Self-pay

## 2019-01-30 ENCOUNTER — Encounter: Payer: PRIVATE HEALTH INSURANCE | Admitting: Physical Therapy

## 2019-01-30 MED ORDER — DULOXETINE HCL 20 MG PO CPEP: 20.0000 mg | ORAL_CAPSULE | Freq: Every day | ORAL | 0 refills | Status: DC

## 2019-01-31 ENCOUNTER — Telehealth: Payer: Self-pay | Admitting: Neurology

## 2019-01-31 NOTE — Telephone Encounter (Signed)
Cymbalta was started for foot discomfort. Not likely cause of her dizziness; which was noted to be happening for past 5 years. She may temporarily stop if wants to see the effect. Then may discuss with dr. Jannifer Franklin. -VRP

## 2019-01-31 NOTE — Telephone Encounter (Signed)
Patient called,  and I reviewed Dr Gladstone Lighter message with her. She stated that she will stop the medication, will call [facility] and tell them she doesn't want the refill sent yesterday. I advised she call back for further questions or concerns. She verbalized understanding, appreciation.

## 2019-01-31 NOTE — Telephone Encounter (Signed)
Attempted to reach patient 3 times, received message "patient cannot be reached at this time". Will call back later.

## 2019-01-31 NOTE — Telephone Encounter (Signed)
Pt called wanting to speak to provider or RN about her DULoxetine (CYMBALTA) 20 MG capsule Pt states it is making her vision worse and making her more dizzy than she was before. Please advise.

## 2019-02-06 ENCOUNTER — Encounter: Payer: PRIVATE HEALTH INSURANCE | Admitting: Physical Therapy

## 2019-02-15 ENCOUNTER — Encounter: Payer: PRIVATE HEALTH INSURANCE | Admitting: Physical Therapy

## 2019-04-12 ENCOUNTER — Ambulatory Visit: Payer: Medicare Other | Admitting: Neurology

## 2019-05-03 ENCOUNTER — Other Ambulatory Visit: Payer: Self-pay | Admitting: Internal Medicine

## 2019-05-03 DIAGNOSIS — Z1231 Encounter for screening mammogram for malignant neoplasm of breast: Secondary | ICD-10-CM

## 2019-05-05 ENCOUNTER — Other Ambulatory Visit: Payer: Self-pay

## 2019-05-05 ENCOUNTER — Ambulatory Visit
Admission: RE | Admit: 2019-05-05 | Discharge: 2019-05-05 | Disposition: A | Discharge status: Home / Self Care | Payer: Medicare Other | Source: Ambulatory Visit | Attending: Internal Medicine | Admitting: Internal Medicine

## 2019-05-05 DIAGNOSIS — Z1231 Encounter for screening mammogram for malignant neoplasm of breast: Secondary | ICD-10-CM

## 2019-09-22 ENCOUNTER — Ambulatory Visit
Admission: EM | Admit: 2019-09-22 | Discharge: 2019-09-22 | Disposition: A | Discharge status: Home / Self Care | Payer: Medicare Other | Attending: Physician Assistant | Admitting: Physician Assistant

## 2019-09-22 DIAGNOSIS — L304 Erythema intertrigo: Secondary | ICD-10-CM | POA: Diagnosis not present

## 2019-09-22 MED ORDER — ZINC OXIDE 40 % EX PSTE: 1.0000 "application " | PASTE | CUTANEOUS | 0 refills | Status: DC | PRN

## 2019-09-22 NOTE — Discharge Instructions (Signed)
Currently no signs of infection from bacteria or yeast. Keep area clean and dry. Apply desitin to help with moisture. Wash with cold water and avoid soap for now. Avoid tight clothing. Monitor for spreading redness, itching/pain, warmth, pus like drainage, follow up for reevaluation. Otherwise, follow up with PCP if symptoms not improving.

## 2019-09-22 NOTE — ED Provider Notes (Signed)
[facility]    CSN: [id number] Arrival date & time: 09/22/19  1043      History   Chief Complaint Chief Complaint  Patient presents with  . Rash    HPI Sydney Booth is a 86 y.o. female.   86 year old female comes in for 2 week history of rash. States has had redness with moisture to the skin fold, and now feel it towards the vaginal area. Denies any pain or itching. Denies spreading redness, warmth, fever. Has been applying A&D, and corn starch with some relief.      Past Medical History:  Diagnosis Date  . Arthritis   . Blood transfusion   . Cervical vertebral fracture (HCC)    fued on its on  . Closed head injury    as a chlid  . Fall 12/22/2017   right eye bruised  . Gastric ulcer   . GERD (gastroesophageal reflux disease)   . H/O hiatal hernia   . Headache(784.0)   . Hyperlipemia   . Hypertension   . Left carpal tunnel syndrome 06/08/2018  . Mitral valve prolapse   . Obesity   . OSA on CPAP   . Osteopenia   . Rotator cuff tear    Left  . Seasonal allergies   . Shortness of breath    With activity  . Ulcer 1991   [place]     Patient Active Problem List   Diagnosis Date Noted  . Left carpal tunnel syndrome 06/08/2018  . Peripheral neuropathy   . Closed fracture of left orbital floor ([facility])   . Concussion with loss of consciousness   . Syncope   . Fall 12/22/2017  . Dizzy 11/24/2013  . Blurred vision 11/24/2013  . Mixed hyperlipidemia 02/09/2013  . 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 HTN (hypertension) 03/24/2011    Class: Chronic    Past Surgical History:  Procedure Laterality Date  . BIospy     . Biospy      Uterus wall  . Carparal Tunnel    . CATARACT EXTRACTION    . Colonscopy    . EYE SURGERY     Right 2011, Left 2008  . LIPOMA EXCISION     FROM BACK   . THUMB FUSION    . TOTAL KNEE ARTHROPLASTY  03/24/2011   Procedure: TOTAL KNEE ARTHROPLASTY;  Surgeon: Hessie Dibble, MD;  Location: [facility];  Service:  Orthopedics;  Laterality: Left;  with revision tibial component  . TUBAL LIGATION      OB History   No obstetric history on file.      Home Medications    Prior to Admission medications   Medication Sig Start Date End Date Taking? Authorizing Provider  aspirin 81 MG tablet Take 81 mg by mouth daily.    [provider]  calcium carbonate (OS-CAL) 600 MG TABS Take 600 mg by mouth daily.    [provider]  CINNAMON PO Take 500 mg by mouth 2 (two) times daily.    [provider]  DULoxetine (CYMBALTA) 20 MG capsule Take 1 capsule (20 mg total) by mouth daily. 01/30/19   Kathrynn Ducking, MD  ezetimibe (ZETIA) 10 MG tablet Take 10 mg by mouth daily.    [provider]  metoprolol tartrate (LOPRESSOR) 25 MG tablet Take 25 mg by mouth 2 (two) times daily.    [provider]  Omega-3 Fatty Acids (FISH OIL) 1000 MG CPDR  Take 1,000 mg by mouth daily.    [provider]  omeprazole (PRILOSEC) 40 MG capsule Take 40 mg by mouth daily.    [provider]  QUERCETIN PO Take 1,500 mg by mouth daily.    [provider]  VITAMIN A PO Take by mouth.    [provider]  vitamin B-12 (CYANOCOBALAMIN) 1000 MCG tablet Take 1,000 mcg by mouth daily.    [provider]  VITAMIN D, CHOLECALCIFEROL, PO Take 1 tablet by mouth daily.    [provider]  Zinc Oxide 40 % PSTE Apply 1 application topically as needed. 09/22/19   Ok Edwards, PA-C    Family History Family History  Problem Relation Age of Onset  . CVA Mother   . Cancer Father        COLON, LIVER AND PANCREAS  . Sleep apnea Brother   . Pulmonary embolism Brother   . Anesthesia problems Neg Hx   . Breast cancer Neg Hx     Social History Social History   Tobacco Use  . Smoking status: Never Smoker  . Smokeless tobacco: Never Used  Vaping Use  . Vaping Use: Never used  Substance Use Topics  . Alcohol use: No    Alcohol/week: 0.0 standard  drinks  . Drug use: No     Allergies   Gabapentin, Sulfa antibiotics, and Zocor [simvastatin]   Review of Systems Review of Systems  Reason unable to perform ROS: See HPI as above.     Physical Exam Triage Vital Signs ED Triage Vitals  Enc Vitals Group     BP 09/22/19 1101 (!) 180/119     Pulse Rate 09/22/19 1101 72     Resp 09/22/19 1101 18     Temp 09/22/19 1101 98.6 F (37 C)     Temp Source 09/22/19 1101 Oral     SpO2 09/22/19 1101 98 %     Weight --      Height --      Head Circumference --      Peak Flow --      Pain Score 09/22/19 1102 0     Pain Loc --      Pain Edu? --      Excl. in [place]? --    No data found.  Updated Vital Signs BP (!) 180/119 (BP Location: Left Arm)   Pulse 72   Temp 98.6 F (37 C) (Oral)   Resp 18   SpO2 98%   Visual Acuity Right Eye Distance:   Left Eye Distance:   Bilateral Distance:    Right Eye Near:   Left Eye Near:    Bilateral Near:     Physical Exam Constitutional:      General: She is not in acute distress.    Appearance: Normal appearance. She is well-developed. She is not toxic-appearing or diaphoretic.  HENT:     Head: Normocephalic and atraumatic.  Eyes:     Conjunctiva/sclera: Conjunctivae normal.     Pupils: Pupils are equal, round, and reactive to light.  Pulmonary:     Effort: Pulmonary effort is normal. No respiratory distress.  Musculoskeletal:     Cervical back: Normal range of motion and neck supple.  Skin:    General: Skin is warm and dry.     Comments: Mild erythema underneath pannus. No significant moisture. No warmth, tenderness. No foul smell. No rashes seen to the vaginal area.   Neurological:     Mental Status:  She is alert and oriented to person, place, and time.      UC Treatments / Results  Labs (all labs ordered are listed, but only abnormal results are displayed) Labs Reviewed - No data to display  EKG   Radiology No results found.  Procedures Procedures (including  critical care time)  Medications Ordered in UC Medications - No data to display  Initial Impression / Assessment and Plan / UC Course  I have reviewed the triage vital signs and the nursing notes.  Pertinent labs & imaging results that were available during my care of the patient were reviewed by me and considered in my medical decision making (see chart for details).    No signs of bacterial or candidal involvement at this time. Discussed will focus on decreasing moisture at this time. Will avoid soap as well due to skin irritation. Return precautions given.  Final Clinical Impressions(s) / UC Diagnoses   Final diagnoses:  Intertrigo    ED Prescriptions    Medication Sig Dispense Auth. Provider   Zinc Oxide 40 % PSTE Apply 1 application topically as needed. 113 g Ok Edwards, PA-C     PDMP not reviewed this encounter.   Ok Edwards, PA-C 09/22/19 1155

## 2019-09-22 NOTE — ED Triage Notes (Signed)
Pt present a rash underneath her stomach and cracks of vagina area. Symptoms started two weeks.  Pt states that its red and a foul odor sometimes.

## 2020-02-06 ENCOUNTER — Ambulatory Visit (INDEPENDENT_AMBULATORY_CARE_PROVIDER_SITE_OTHER): Payer: Medicare Other

## 2020-02-06 ENCOUNTER — Ambulatory Visit (INDEPENDENT_AMBULATORY_CARE_PROVIDER_SITE_OTHER): Payer: Medicare Other | Admitting: Internal Medicine

## 2020-02-06 ENCOUNTER — Encounter: Payer: Self-pay | Admitting: Internal Medicine

## 2020-02-06 VITALS — BP 136/80 | HR 85 | Temp 97.5°F | Resp 18 | Ht 60.0 in | Wt 226.4 lb

## 2020-02-06 DIAGNOSIS — L304 Erythema intertrigo: Secondary | ICD-10-CM | POA: Diagnosis not present

## 2020-02-06 DIAGNOSIS — B373 Candidiasis of vulva and vagina: Secondary | ICD-10-CM

## 2020-02-06 DIAGNOSIS — R0609 Other forms of dyspnea: Secondary | ICD-10-CM

## 2020-02-06 DIAGNOSIS — E782 Mixed hyperlipidemia: Secondary | ICD-10-CM

## 2020-02-06 DIAGNOSIS — R0600 Dyspnea, unspecified: Secondary | ICD-10-CM | POA: Diagnosis not present

## 2020-02-06 DIAGNOSIS — E2839 Other primary ovarian failure: Secondary | ICD-10-CM

## 2020-02-06 DIAGNOSIS — Z7689 Persons encountering health services in other specified circumstances: Secondary | ICD-10-CM

## 2020-02-06 DIAGNOSIS — B3731 Acute candidiasis of vulva and vagina: Secondary | ICD-10-CM

## 2020-02-06 DIAGNOSIS — Z23 Encounter for immunization: Secondary | ICD-10-CM

## 2020-02-06 MED ORDER — ZOSTER VAC RECOMB ADJUVANTED 50 MCG/0.5ML IM SUSR: 0.5000 mL | Freq: Once | INTRAMUSCULAR | 0 refills | Status: AC

## 2020-02-06 MED ORDER — FLUCONAZOLE 150 MG PO TABS: ORAL_TABLET | ORAL | 0 refills | Status: DC

## 2020-02-06 MED ORDER — NYSTATIN 100000 UNIT/GM EX POWD: 1.0000 "application " | Freq: Two times a day (BID) | CUTANEOUS | 1 refills | Status: DC

## 2020-02-06 MED ORDER — MICONAZOLE 3 4 % VA CREA: 1.0000 "application " | TOPICAL_CREAM | Freq: Every day | VAGINAL | 0 refills | Status: DC | PRN

## 2020-02-06 NOTE — Progress Notes (Signed)
Subjective:    Sydney Booth - 87 y.o. female MRN [id number]  Date of birth: 1931-10-28  HPI  Sydney Booth is to establish care. Was previously established with Dr. Delfina Redwood, transferring care due to our location.  Patient has a PMH significant for HTN, HLD, GERD and hiatal hernia.  Fully vaccinated for COVID.    Patient reports SOB with exertion particularly with climbing stairs. No SOB with sitting. This has been occurring for about one year. No chest pain. Able to sleep on one pillow. No PND. Endorses some mild ankle swelling. Has OSA and uses CPAP nightly. No history of asthma or lung disease. Has never been prescribed an inhaler. Never smoker.   Reports rash in her groin area underneath pannus. Was treated with Zinc Oxide by UC several months ago. Reports rash was really bad but then cleared up. Has returned but is not as bad. Also now having a couple days of irritation and itchiness in vaginal area. Reports history of chronic yeast infections.    ROS per HPI     Health Maintenance:  Health Maintenance Due  Topic Date Due  . DEXA SCAN  Never done     Past Medical History: Patient Active Problem List   Diagnosis Date Noted  . Left carpal tunnel syndrome 06/08/2018  . Peripheral neuropathy   . Closed fracture of left orbital floor ([place])   . Concussion with loss of consciousness   . Syncope   . Fall 12/22/2017  . Dizzy 11/24/2013  . Blurred vision 11/24/2013  . Mixed hyperlipidemia 02/09/2013  . 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 HTN (hypertension) 03/24/2011    Class: Chronic      Social History   reports that she has never smoked. She has never used smokeless tobacco. She reports that she does not drink alcohol and does not use drugs.   Family History  family history includes CVA in her mother; Cancer in her father; Pulmonary embolism in her brother; Sleep apnea in her brother.   Medications: reviewed and updated   Objective:   Physical  Exam BP 136/80 (BP Location: Right Arm, Patient Position: Sitting, Cuff Size: Large)   Pulse 85   Temp (!) 97.5 F (36.4 C) (Oral)   Resp 18   Ht 5' (1.524 m)   Wt 226 lb 6.4 oz (102.7 kg)   SpO2 98%   BMI 44.22 kg/m  Physical Exam Constitutional:      General: She is not in acute distress.    Appearance: She is not diaphoretic.  HENT:     Head: Normocephalic and atraumatic.  Eyes:     Extraocular Movements: EOM normal.     Conjunctiva/sclera: Conjunctivae normal.  Cardiovascular:     Rate and Rhythm: Normal rate and regular rhythm.     Heart sounds: Normal heart sounds. No murmur heard.   Pulmonary:     Effort: Pulmonary effort is normal. No respiratory distress.     Breath sounds: Normal breath sounds.  Genitourinary:    Comments: Labia majora with beefy red plaques. White thick discharge present from vaginal vault.  Musculoskeletal:        General: Normal range of motion.  Skin:    General: Skin is warm and dry.     Comments: Erythema with scattered satellite lesions present underneath pannus.   Neurological:     Mental Status: She is alert and oriented to person, place, and time.  Psychiatric:  Mood and Affect: Affect normal.        Judgment: Judgment normal.         Assessment & Plan:   1. Encounter to establish care Reviewed patient's PMH, social history, surgical history, and medications.   2. Dyspnea on exertion Lung and cardiac exam is benign. Will obtain labs and CXR to evaluate for potential causes. Reviewed echo from 2019 showed normal EF but was unable to evaluate for diastolic dysfunction. Given new onset DOE, would consider repeat echo. Also of note, EKG in 2019 showed signs of left atrial enlargement which could be concerning for volume overload.  - CBC - Comprehensive metabolic panel - TSH - Brain natriuretic peptide - DG Chest 2 View; Future  3. Intertrigo Discussed importance of minimizing moisture to the area. Nystatin powder  prescribed.  - nystatin (MYCOSTATIN/NYSTOP) powder; Apply 1 application topically 2 (two) times daily.  Dispense: 15 g; Refill: 1  4. Yeast vaginitis Will treat with Diflucan. Rx given for Miconazole to use for recurrent infections prn. Discussed importance of cotton underwear.  - fluconazole (DIFLUCAN) 150 MG tablet; Take one tablet now. Take second tablet in 72 hours if still feeling symptoms.  Dispense: 2 tablet; Refill: 0 - MICONAZOLE NITRATE VAGINAL (MICONAZOLE 3) 4 % CREA; Place 1 application vaginally daily as needed.  Dispense: 25 g; Refill: 0  5. Estrogen deficiency - DG Bone Density; Future  6. Mixed hyperlipidemia - Lipid panel     Phill Myron, D.O. 02/06/2020, 2:08 PM Primary Care at [facility]

## 2020-02-06 NOTE — Addendum Note (Signed)
Addended by: Eddie Dibbles on: 02/06/2020 02:58 PM   Modules accepted: Orders

## 2020-02-07 LAB — CBC
Hematocrit: 40.9 % (ref 34.0–46.6)
Hemoglobin: 13.8 g/dL (ref 11.1–15.9)
MCH: 30.8 pg (ref 26.6–33.0)
MCHC: 33.7 g/dL (ref 31.5–35.7)
MCV: 91 fL (ref 79–97)
Platelets: 291 10*3/uL (ref 150–450)
RBC: 4.48 x10E6/uL (ref 3.77–5.28)
RDW: 12.2 % (ref 11.7–15.4)
WBC: 4.7 10*3/uL (ref 3.4–10.8)

## 2020-02-07 LAB — COMPREHENSIVE METABOLIC PANEL
ALT: 13 IU/L (ref 0–32)
AST: 23 IU/L (ref 0–40)
Albumin/Globulin Ratio: 2 (ref 1.2–2.2)
Albumin: 4.5 g/dL (ref 3.6–4.6)
Alkaline Phosphatase: 143 IU/L — ABNORMAL HIGH (ref 44–121)
BUN/Creatinine Ratio: 14 (ref 12–28)
BUN: 11 mg/dL (ref 8–27)
Bilirubin Total: 0.4 mg/dL (ref 0.0–1.2)
CO2: 26 mmol/L (ref 20–29)
Calcium: 9.7 mg/dL (ref 8.7–10.3)
Chloride: 98 mmol/L (ref 96–106)
Creatinine, Ser: 0.78 mg/dL (ref 0.57–1.00)
GFR calc Af Amer: 80 mL/min/{1.73_m2} (ref >59)
GFR calc non Af Amer: 69 mL/min/{1.73_m2} (ref >59)
Globulin, Total: 2.2 g/dL (ref 1.5–4.5)
Glucose: 93 mg/dL (ref 65–99)
Potassium: 4.3 mmol/L (ref 3.5–5.2)
Sodium: 138 mmol/L (ref 134–144)
Total Protein: 6.7 g/dL (ref 6.0–8.5)

## 2020-02-07 LAB — LIPID PANEL
Chol/HDL Ratio: 2.8 ratio (ref 0.0–4.4)
Cholesterol, Total: 187 mg/dL (ref 100–199)
HDL: 67 mg/dL (ref >39)
LDL Chol Calc (NIH): 101 mg/dL — ABNORMAL HIGH (ref 0–99)
Triglycerides: 108 mg/dL (ref 0–149)
VLDL Cholesterol Cal: 19 mg/dL (ref 5–40)

## 2020-02-07 LAB — TSH: TSH: 3.09 u[IU]/mL (ref 0.450–4.500)

## 2020-02-07 LAB — BRAIN NATRIURETIC PEPTIDE: BNP: 122.8 pg/mL — ABNORMAL HIGH (ref 0.0–100.0)

## 2020-02-13 ENCOUNTER — Other Ambulatory Visit: Payer: Self-pay | Admitting: Internal Medicine

## 2020-02-13 DIAGNOSIS — R0609 Other forms of dyspnea: Secondary | ICD-10-CM

## 2020-02-13 DIAGNOSIS — R7989 Other specified abnormal findings of blood chemistry: Secondary | ICD-10-CM

## 2020-02-13 DIAGNOSIS — R0600 Dyspnea, unspecified: Secondary | ICD-10-CM

## 2020-02-14 ENCOUNTER — Other Ambulatory Visit (INDEPENDENT_AMBULATORY_CARE_PROVIDER_SITE_OTHER): Payer: Medicare Other

## 2020-02-14 DIAGNOSIS — R0600 Dyspnea, unspecified: Secondary | ICD-10-CM

## 2020-02-14 DIAGNOSIS — R0609 Other forms of dyspnea: Secondary | ICD-10-CM

## 2020-02-14 LAB — POC COVID19 BINAXNOW: SARS Coronavirus 2 Ag: NEGATIVE

## 2020-02-14 NOTE — Progress Notes (Signed)
Patient presented for COVID rapid testing

## 2020-02-20 ENCOUNTER — Ambulatory Visit (INDEPENDENT_AMBULATORY_CARE_PROVIDER_SITE_OTHER): Payer: Medicare Other | Admitting: Internal Medicine

## 2020-02-20 ENCOUNTER — Other Ambulatory Visit: Payer: Self-pay

## 2020-02-20 ENCOUNTER — Encounter: Payer: Self-pay | Admitting: Internal Medicine

## 2020-02-20 VITALS — BP 147/81 | HR 81 | Temp 97.8°F | Resp 18 | Ht 60.0 in | Wt 227.0 lb

## 2020-02-20 DIAGNOSIS — B373 Candidiasis of vulva and vagina: Secondary | ICD-10-CM

## 2020-02-20 DIAGNOSIS — Z8679 Personal history of other diseases of the circulatory system: Secondary | ICD-10-CM

## 2020-02-20 DIAGNOSIS — R0600 Dyspnea, unspecified: Secondary | ICD-10-CM

## 2020-02-20 DIAGNOSIS — R0609 Other forms of dyspnea: Secondary | ICD-10-CM

## 2020-02-20 DIAGNOSIS — R5381 Other malaise: Secondary | ICD-10-CM

## 2020-02-20 DIAGNOSIS — N898 Other specified noninflammatory disorders of vagina: Secondary | ICD-10-CM | POA: Diagnosis not present

## 2020-02-20 DIAGNOSIS — B3731 Acute candidiasis of vulva and vagina: Secondary | ICD-10-CM

## 2020-02-20 DIAGNOSIS — R918 Other nonspecific abnormal finding of lung field: Secondary | ICD-10-CM

## 2020-02-20 MED ORDER — FLUCONAZOLE 150 MG PO TABS: ORAL_TABLET | ORAL | 0 refills | Status: DC

## 2020-02-20 MED ORDER — MICONAZOLE 3 4 % VA CREA: 1.0000 "application " | TOPICAL_CREAM | Freq: Every day | VAGINAL | 0 refills | Status: DC | PRN

## 2020-02-20 MED ORDER — AMOXICILLIN 500 MG PO CAPS: ORAL_CAPSULE | ORAL | 0 refills | Status: DC

## 2020-02-20 NOTE — Progress Notes (Signed)
C/o vaginal itching/burning x 1 wk, Nystatin made it worse

## 2020-02-20 NOTE — Progress Notes (Signed)
Subjective:    Sydney Booth - 87 y.o. female MRN [id number]  Date of birth: [birth year]  HPI  Ramie Palladino is here for acute concerns. I have seen her for intertrigo in recent past. However, reports she is now having itching and burning more lower in the vagina and internally. This started less than a week ago. She improved situation by not wearing underwear over the weekend. Has not noticed much discharge. No vaginal bleeding. Tried to apply the Nystatin but it made it more irritated. Also tried Miconazole. Has not applied anything the last couple days.    Health Maintenance:  Health Maintenance Due  Topic Date Due  . DEXA SCAN  Never done    -  reports that she has never smoked. She has never used smokeless tobacco. - Review of Systems: Per HPI. - Past Medical History: Patient Active Problem List   Diagnosis Date Noted  . Left carpal tunnel syndrome 06/08/2018  . Peripheral neuropathy   . Syncope   . Blurred vision 11/24/2013  . Mixed hyperlipidemia 02/09/2013  . 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 HTN (hypertension) 03/24/2011    Class: Chronic   - Medications: reviewed and updated   Objective:   Physical Exam BP (!) 147/81 (BP Location: Right Arm, Patient Position: Sitting, Cuff Size: Large)   Pulse 81   Temp 97.8 F (36.6 C) (Oral)   Resp 18   Ht 5' (1.524 m)   Wt 227 lb (103 kg)   SpO2 97%   BMI 44.33 kg/m  Physical Exam Constitutional:      General: She is not in acute distress.    Appearance: She is not diaphoretic.  Cardiovascular:     Rate and Rhythm: Normal rate.  Pulmonary:     Effort: Pulmonary effort is normal. No respiratory distress.  Musculoskeletal:        General: Normal range of motion.  Skin:    General: Skin is warm and dry.  Neurological:     Mental Status: She is alert and oriented to person, place, and time.  Psychiatric:        Mood and Affect: Affect normal.        Judgment: Judgment normal.             Assessment & Plan:   1. Vaginal irritation 2. Yeast vaginitis Patient wishes to avoid exam if possible. Will treat for presumed yeast vaginitis as below. Discussed ways to prevent yeast infections.  - fluconazole (DIFLUCAN) 150 MG tablet; Take one tablet now. Take second tablet in 72 hours if still feeling symptoms.  Dispense: 2 tablet; Refill: 0 - MICONAZOLE NITRATE VAGINAL (MICONAZOLE 3) 4 % CREA; Place 1 application vaginally daily as needed.  Dispense: 25 g; Refill: 0  3. History of mitral valve prolapse Patient asks for antibiotic prophylaxis for h/o MVP for dental procedures.  - amoxicillin (AMOXIL) 500 MG capsule; Take 4 capsules (2000 mg) prior to dental procedures.  Dispense: 28 capsule; Refill: 0  4. Debility Handicap placard form filled out.   5. Abnormality of lung on CXR 6. Dyspnea on exertion Patient seen on 2/1 for DOE. At time, BNP was obtained and elevated. Echo is ordered and pending. CXR was obtained and showed mild bilateral upper lobe interstial prominence. Mild pneumonitis could not be excluded. Question of COVID per radiology. While patient had no infectious symptoms at time or prior to presentation, COVID test was obtained and was subsequently negative. Has no  prior h/o lung disease and has never been smoker. Will refer to pulmonology regarding abnormal CXR and to determine if further testing with PFTs and/or CT chest should be obtained.  - Ambulatory referral to [place], D.O. 02/20/2020, 3:55 PM Primary Care at [facility]

## 2020-03-05 ENCOUNTER — Ambulatory Visit
Admission: EM | Admit: 2020-03-05 | Discharge: 2020-03-05 | Disposition: A | Discharge status: Home / Self Care | Payer: Medicare Other | Attending: Urgent Care | Admitting: Urgent Care

## 2020-03-05 ENCOUNTER — Other Ambulatory Visit: Payer: Self-pay

## 2020-03-05 DIAGNOSIS — N898 Other specified noninflammatory disorders of vagina: Secondary | ICD-10-CM | POA: Insufficient documentation

## 2020-03-05 DIAGNOSIS — B3731 Acute candidiasis of vulva and vagina: Secondary | ICD-10-CM

## 2020-03-05 DIAGNOSIS — B373 Candidiasis of vulva and vagina: Secondary | ICD-10-CM | POA: Insufficient documentation

## 2020-03-05 MED ORDER — FLUCONAZOLE 150 MG PO TABS: 150.0000 mg | ORAL_TABLET | ORAL | 0 refills | Status: DC

## 2020-03-05 NOTE — ED Provider Notes (Signed)
[name]   MRN: [id number] DOB: 12-20-1931  Subjective:   Sydney Booth is a 87 y.o. female presenting for 3-day history of acute onset recurrent vaginal itching, irritation.  Patient states that 2 to 3 weeks ago she was started on Diflucan, took 2 doses 72 hours apart.  States that she has a history of yeast infections.  Diflucan had cleared her up until this past Saturday.  Denies fever, nausea, vomiting, dysuria, urinary frequency, genital rash, vaginal discharge.  No current facility-administered medications for this encounter.  Current Outpatient Medications:  .  aspirin 81 MG tablet, Take 81 mg by mouth daily., Disp: , Rfl:  .  calcium carbonate (OS-CAL) 600 MG TABS, Take 600 mg by mouth daily., Disp: , Rfl:  .  CINNAMON PO, Take 500 mg by mouth 2 (two) times daily., Disp: , Rfl:  .  ezetimibe (ZETIA) 10 MG tablet, Take 10 mg by mouth daily., Disp: , Rfl:  .  metoprolol tartrate (LOPRESSOR) 25 MG tablet, Take 25 mg by mouth 2 (two) times daily., Disp: , Rfl:  .  MICONAZOLE NITRATE VAGINAL (MICONAZOLE 3) 4 % CREA, Place 1 application vaginally daily as needed., Disp: 25 g, Rfl: 0 .  NONFORMULARY OR COMPOUNDED ITEM, in the morning and at bedtime. Beet Root, Disp: , Rfl:  .  nystatin (MYCOSTATIN/NYSTOP) powder, Apply 1 application topically 2 (two) times daily., Disp: 15 g, Rfl: 1 .  Omega-3 Fatty Acids (FISH OIL) 1000 MG CPDR, Take 1,000 mg by mouth daily., Disp: , Rfl:  .  omeprazole (PRILOSEC) 40 MG capsule, Take 40 mg by mouth daily., Disp: , Rfl:  .  QUERCETIN PO, Take 1,500 mg by mouth daily., Disp: , Rfl:  .  Turmeric 500 MG CAPS, Take by mouth daily., Disp: , Rfl:  .  VITAMIN A PO, Take 10,000 mg by mouth., Disp: , Rfl:  .  vitamin B-12 (CYANOCOBALAMIN) 1000 MCG tablet, Take 1,000 mcg by mouth daily., Disp: , Rfl:  .  VITAMIN D, CHOLECALCIFEROL, PO, Take 1 tablet by mouth daily., Disp: , Rfl:  .  Zinc Oxide 40 % PSTE, Apply 1 application topically as  needed., Disp: 113 g, Rfl: 0   Allergies  Allergen Reactions  . Gabapentin     dizzy  . Sulfa Antibiotics Hives  . Zocor [Simvastatin]     Muscle aches    Past Medical History:  Diagnosis Date  . Arthritis   . Blood transfusion   . Cervical vertebral fracture ([facility])    fued on its on  . Closed head injury    as a chlid  . Fall 12/22/2017   right eye bruised  . Gastric ulcer   . GERD (gastroesophageal reflux disease)   . H/O hiatal hernia   . Headache(784.0)   . Hyperlipemia   . Hypertension   . Left carpal tunnel syndrome 06/08/2018  . Mitral valve prolapse   . Obesity   . OSA on CPAP   . Osteopenia   . Rotator cuff tear    Left  . Seasonal allergies   . Shortness of breath    With activity  . Ulcer 1991   [place]      Past Surgical History:  Procedure Laterality Date  . BIospy     . Biospy      Uterus wall  . Carparal Tunnel    . CATARACT EXTRACTION    . Colonscopy    . EYE SURGERY  Right 2011, Left 2008  . LIPOMA EXCISION     FROM BACK   . THUMB FUSION    . TOTAL KNEE ARTHROPLASTY  03/24/2011   Procedure: TOTAL KNEE ARTHROPLASTY;  Surgeon: Hessie Dibble, MD;  Location: [place];  Service: Orthopedics;  Laterality: Left;  with revision tibial component  . TUBAL LIGATION      Family History  Problem Relation Age of Onset  . CVA Mother   . Cancer Father        COLON, LIVER AND PANCREAS  . Sleep apnea Brother   . Pulmonary embolism Brother   . Anesthesia problems Neg Hx   . Breast cancer Neg Hx     Social History   Tobacco Use  . Smoking status: Never Smoker  . Smokeless tobacco: Never Used  Vaping Use  . Vaping Use: Never used  Substance Use Topics  . Alcohol use: No    Alcohol/week: 0.0 standard drinks  . Drug use: No    ROS   Objective:   Vitals: BP (!) 161/106 (BP Location: Left Arm)   Pulse (!) 104   Temp 97.9 F (36.6 C) (Oral)   Resp 18   SpO2 97%   Physical Exam Constitutional:      General: She is not in acute  distress.    Appearance: Normal appearance. She is well-developed. She is obese. She is not ill-appearing, toxic-appearing or diaphoretic.  HENT:     Head: Normocephalic and atraumatic.     Nose: Nose normal.     Mouth/Throat:     Mouth: Mucous membranes are moist.     Pharynx: Oropharynx is clear.  Eyes:     General: No scleral icterus.    Extraocular Movements: Extraocular movements intact.     Pupils: Pupils are equal, round, and reactive to light.  Cardiovascular:     Rate and Rhythm: Normal rate.  Pulmonary:     Effort: Pulmonary effort is normal.  Skin:    General: Skin is warm and dry.  Neurological:     General: No focal deficit present.     Mental Status: She is alert and oriented to person, place, and time.  Psychiatric:        Mood and Affect: Mood normal.        Behavior: Behavior normal.        Thought Content: Thought content normal.        Judgment: Judgment normal.       Assessment and Plan :   PDMP not reviewed this encounter.  1. Yeast vaginitis   2. Vaginal itching     Will cover empirically for yeast vaginitis with Diflucan.  Recommended a longer course as she had initial response with 2 doses 72 hours apart.  We will also extended to weekly dosing.  Labs pending. Counseled patient on potential for adverse effects with medications prescribed/recommended today, ER and return-to-clinic precautions discussed, patient verbalized understanding.    Jaynee Eagles, PA-C 03/05/20 1144

## 2020-03-05 NOTE — ED Triage Notes (Signed)
Pt c/o vaginal itching since Saturday night. States had a yeast infection 2 wks ago that went away with diflucan. States didn't have itching last time.

## 2020-03-06 ENCOUNTER — Ambulatory Visit (HOSPITAL_COMMUNITY): Payer: Medicare Other | Attending: Cardiology

## 2020-03-06 DIAGNOSIS — R7989 Other specified abnormal findings of blood chemistry: Secondary | ICD-10-CM | POA: Insufficient documentation

## 2020-03-06 DIAGNOSIS — R0609 Other forms of dyspnea: Secondary | ICD-10-CM

## 2020-03-06 DIAGNOSIS — R0600 Dyspnea, unspecified: Secondary | ICD-10-CM | POA: Diagnosis not present

## 2020-03-06 LAB — CERVICOVAGINAL ANCILLARY ONLY
Bacterial Vaginitis (gardnerella): NEGATIVE
Candida Glabrata: NEGATIVE
Candida Vaginitis: NEGATIVE
Comment: NEGATIVE
Comment: NEGATIVE
Comment: NEGATIVE

## 2020-03-06 LAB — ECHOCARDIOGRAM COMPLETE
MV VTI: 2.56 cm2
S' Lateral: 2.8 cm

## 2020-03-12 ENCOUNTER — Telehealth: Payer: Self-pay | Admitting: Internal Medicine

## 2020-03-12 NOTE — Telephone Encounter (Addendum)
Pt called for RESULTS ECG 03/06/20 PCP ordered & was done at [place].   Pt also reports continued sob. Pt also reports pulmonology appt scheduled for 04/01/20.   Pt also would like to drive to [place] for mothers funeral but does not want to go unless cleared by PCP. Pt ph [phone]

## 2020-03-13 ENCOUNTER — Other Ambulatory Visit: Payer: Self-pay | Admitting: Internal Medicine

## 2020-03-13 ENCOUNTER — Telehealth: Payer: Self-pay | Admitting: Internal Medicine

## 2020-03-13 DIAGNOSIS — R0609 Other forms of dyspnea: Secondary | ICD-10-CM

## 2020-03-13 DIAGNOSIS — R0600 Dyspnea, unspecified: Secondary | ICD-10-CM

## 2020-03-13 DIAGNOSIS — I2721 Secondary pulmonary arterial hypertension: Secondary | ICD-10-CM

## 2020-03-13 DIAGNOSIS — I340 Nonrheumatic mitral (valve) insufficiency: Secondary | ICD-10-CM

## 2020-03-13 NOTE — Telephone Encounter (Signed)
Pt is urgently requesting a call from Clinic regarding results, patient was emphatic about it being "8 days already!"  Pt has reminded PCP and Nurse are in Clinic with Patients and was asked to wait for call.  Please advise and thank you

## 2020-03-13 NOTE — Telephone Encounter (Signed)
Have sent result note to Eddie Dibbles to review with patient. I would recommend that if she does drive that she get out of the car at least once every 2 hours for a 15 minute break to walk and stretch to prevent blood clots in the legs/lungs.   Phill Myron, D.O. Primary Care at [facility]  03/13/2020, 3:58 PM

## 2020-03-13 NOTE — Telephone Encounter (Signed)
Pt aware of instructions and voiced understanding

## 2020-03-13 NOTE — Telephone Encounter (Signed)
Pls review ECG results from 3/2 and advise

## 2020-04-01 ENCOUNTER — Other Ambulatory Visit: Payer: Self-pay

## 2020-04-01 ENCOUNTER — Encounter: Payer: Self-pay | Admitting: Pulmonary Disease

## 2020-04-01 ENCOUNTER — Ambulatory Visit (INDEPENDENT_AMBULATORY_CARE_PROVIDER_SITE_OTHER): Payer: Medicare Other | Admitting: Pulmonary Disease

## 2020-04-01 ENCOUNTER — Ambulatory Visit (INDEPENDENT_AMBULATORY_CARE_PROVIDER_SITE_OTHER): Payer: Medicare Other

## 2020-04-01 VITALS — BP 128/80 | HR 99 | Temp 98.0°F | Ht 60.0 in | Wt 223.0 lb

## 2020-04-01 DIAGNOSIS — J189 Pneumonia, unspecified organism: Secondary | ICD-10-CM

## 2020-04-01 DIAGNOSIS — R9389 Abnormal findings on diagnostic imaging of other specified body structures: Secondary | ICD-10-CM | POA: Diagnosis not present

## 2020-04-01 NOTE — Patient Instructions (Addendum)
  Chest x-ray continues to show prominent markings Proceed with high-resolution CT of the chest -schedule in 2-4 weeks after cardiology evaluation

## 2020-04-01 NOTE — Addendum Note (Signed)
Addended by: Birder Robson on: 04/01/2020 02:10 PM   Modules accepted: Orders

## 2020-04-01 NOTE — Assessment & Plan Note (Signed)
Chest x-ray continues to show bilateral upper lobe interstitial prominence which may be related to cephalization of vascular markings due to pulmonary hypertension.  This may be related to moderate mitral stenosis although by echo numbers this only appears to be of moderate degree. We will await cardiology evaluation to see if any intervention is planned Labs showed a low BNP level. If no cardiology intervention planned, will proceed with high-resolution CT of the chest to rule out atypical pulmonary fibrosis however this is a lesser concern
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
## 2020-04-03 NOTE — Progress Notes (Signed)
Cardiology Office Note    Date:  04/09/2020   ID:  [name], DOB 09-28-1931, MRN [id number]   PCP:  Nicolette Bang, DO   [place]  Cardiologist:  Sinclair Grooms, MD  Advanced Practice Provider:  No care team member to display Electrophysiologist:  None   [id number]}   Chief Complaint  Patient presents with  . Shortness of Breath    History of Present Illness:  Sydney Booth is a 87 y.o. female with history of hypertension, HLD, OSA on CPAP.  She saw Dr. Tamala Julian 01/2018 after an episode of syncope resulting in a fall with right orbital fracture.   Holter monitor 02/2018 no A. fib.  1 brief run of SVT less than 6 beats.  Occasional PACs and PVCs.  Patient added onto my schedule because of increased shortness of breath and echo ordered by his PCP showed; 2D echo 03/06/2020 normal LVEF 60 to 65% with mildly elevated pulmonary artery systolic pressures RVSP 44 mmHg mild MR, moderate mitral stenosis.  Saw pulmonary and thought she may have had covid19 because of loss of taste/smell. CXR peribronchial thickening and scheduled for CT. Gets short of breath going up stairs and shopping. Has been chronic but seems to have worsened. Has gained 20 lbs. Has neuropathy and can't walk a lot. Planning to go to 70th high school class reunion in [place].    Past Medical History:  Diagnosis Date  . Arthritis   . Blood transfusion   . Cervical vertebral fracture (HCC)    fued on its on  . Closed head injury    as a chlid  . Fall 12/22/2017   right eye bruised  . Gastric ulcer   . GERD (gastroesophageal reflux disease)   . H/O hiatal hernia   . Headache(784.0)   . Hyperlipemia   . Hypertension   . Left carpal tunnel syndrome 06/08/2018  . Mitral valve prolapse   . Obesity   . OSA on CPAP   . Osteopenia   . Rotator cuff tear    Left  . Seasonal allergies   . Shortness of breath    With activity  . Ulcer 1991   [place]     Past Surgical History:   Procedure Laterality Date  . BIospy     . Biospy      Uterus wall  . Carparal Tunnel    . CATARACT EXTRACTION    . Colonscopy    . EYE SURGERY     Right 2011, Left 2008  . LIPOMA EXCISION     FROM BACK   . THUMB FUSION    . TOTAL KNEE ARTHROPLASTY  03/24/2011   Procedure: TOTAL KNEE ARTHROPLASTY;  Surgeon: Hessie Dibble, MD;  Location: [facility];  Service: Orthopedics;  Laterality: Left;  with revision tibial component  . TUBAL LIGATION      Current Medications: Current Meds  Medication Sig  . aspirin 81 MG tablet Take 81 mg by mouth daily.  . calcium carbonate (OS-CAL) 600 MG TABS Take 600 mg by mouth daily.  Marland Kitchen CINNAMON PO Take 500 mg by mouth 2 (two) times daily.  Marland Kitchen ezetimibe (ZETIA) 10 MG tablet Take 10 mg by mouth daily.  . furosemide (LASIX) 20 MG tablet Take 1 tablet (20 mg total) by mouth daily.  . metoprolol tartrate (LOPRESSOR) 25 MG tablet Take 25 mg by mouth 2 (two) times daily.  . Omega-3 Fatty Acids (FISH OIL) 1000 MG  CPDR Take 1,000 mg by mouth daily.  Marland Kitchen omeprazole (PRILOSEC) 40 MG capsule Take 40 mg by mouth daily.  . potassium chloride SA (KLOR-CON) 20 MEQ tablet Take 1 tablet (20 mEq total) by mouth daily.  Marland Kitchen QUERCETIN PO Take 1,500 mg by mouth daily.  . Turmeric 500 MG CAPS Take by mouth daily.  Marland Kitchen VITAMIN A PO Take 10,000 mg by mouth.  . vitamin B-12 (CYANOCOBALAMIN) 1000 MCG tablet Take 1,000 mcg by mouth daily.  Marland Kitchen VITAMIN D, CHOLECALCIFEROL, PO Take 1 tablet by mouth daily.  . Zinc Oxide 40 % PSTE Apply 1 application topically as needed.     Allergies:   Cymbalta [duloxetine hcl], Gabapentin, Sulfa antibiotics, and Zocor [simvastatin]   Social History   Socioeconomic History  . Marital status: Widowed    Spouse name: Not on file  . Number of children: 5  . Years of education: 55  . Highest education level: Not on file  Occupational History  . Occupation: Retired   Tobacco Use  . Smoking status: Never Smoker  . Smokeless tobacco: Never Used   Vaping Use  . Vaping Use: Never used  Substance and Sexual Activity  . Alcohol use: No    Alcohol/week: 0.0 standard drinks  . Drug use: No  . Sexual activity: Not on file  Other Topics Concern  . Not on file  Social History Narrative   Patient lives at home with daughter   Patient is retired   Patient is widowed   Patient has a high school education   Patient has 5 children    Social Determinants of Radio broadcast assistant Strain: Not on file  Food Insecurity: Not on file  Transportation Needs: Not on file  Physical Activity: Not on file  Stress: Not on file  Social Connections: Not on file     Family History:  The patient's family history includes CVA in her mother; Cancer in her father; Pulmonary embolism in her brother; Sleep apnea in her brother.   ROS:   Please see the history of present illness.    ROS All other systems reviewed and are negative.   PHYSICAL EXAM:   VS:  BP 120/80   Pulse 88   Ht 5' (1.524 m)   Wt 222 lb 9.6 oz (101 kg)   SpO2 96%   BMI 43.47 kg/m   Physical Exam  GEN: Well nourished, well developed, in no acute distress  Neck: no JVD, carotid bruits, or masses Cardiac:RRR; minimal murmur at the apex Respiratory:  clear to auscultation bilaterally, normal work of breathing GI: soft, nontender, nondistended, + BS Ext: without cyanosis, clubbing, or edema, Good distal pulses bilaterally Neuro:  Alert and Oriented x 3 Psych: euthymic mood, full affect  Wt Readings from Last 3 Encounters:  04/09/20 222 lb 9.6 oz (101 kg)  04/01/20 223 lb (101.2 kg)  02/20/20 227 lb (103 kg)      Studies/Labs Reviewed:   EKG:  EKG is  ordered today.  The ekg ordered today demonstrates normal sinus rhythm, nonspecific ST-T wave changes,no acute change  Recent Labs: 02/06/2020: ALT 13; BNP 122.8; BUN 11; Creatinine, Ser 0.78; Hemoglobin 13.8; Platelets 291; Potassium 4.3; Sodium 138; TSH 3.090   Lipid Panel    Component Value Date/Time   CHOL 187  02/06/2020 1502   TRIG 108 02/06/2020 1502   HDL 67 02/06/2020 1502   CHOLHDL 2.8 02/06/2020 1502   LDLCALC 101 (H) 02/06/2020 1502    Additional studies/  records that were reviewed today include:  Holter monitor 02/2018 Study Highlights     Normal sinus rhythm  No atrial fibrillation or significant arrhythmias.  Occasional PACs and PVCs.  No correlation between arrhythmia and symptoms.  1 brief run of SVT less than 6 beats.  Intermittent interventricular conduction delay/bundle branch block.   No sustained arrhythmias.  One episode of nonsustained SVT less than 8 beats.   2D echo 03/06/2020 IMPRESSIONS     1. Left ventricular ejection fraction, by estimation, is 60 to 65%. The  left ventricle has normal function. The left ventricle has no regional  wall motion abnormalities. There is moderate left ventricular hypertrophy.  Left ventricular diastolic  parameters are indeterminate.   2. Right ventricular systolic function is normal. The right ventricular  size is normal. There is mildly elevated pulmonary artery systolic  pressure. The estimated right ventricular systolic pressure is 79.4 mmHg.   3. The mitral valve is abnormal. Mild mitral valve regurgitation.  Moderate mitral stenosis. MG 7 mmHg at HR 92 bpm. Severe mitral annular  calcification.   4. The aortic valve was not well visualized. Aortic valve regurgitation  is not visualized. Mild to moderate aortic valve sclerosis/calcification  is present, without any evidence of aortic stenosis.   5. The inferior vena cava is normal in size with greater than 50%  respiratory variability, suggesting right atrial pressure of 3 mmHg.   Comparison(s): 12/23/17 EF 65-70%. PA pressure 73mHg.   12/23/2017 Study Conclusions   - Left ventricle: The cavity size was normal. There was mild    concentric hypertrophy. Systolic function was vigorous. The    estimated ejection fraction was in the range of 65% to 70%. Wall     motion was normal; there were no regional wall motion    abnormalities. Diastolic function assessment limited due to    severity of mitral annular calcification.  - Mitral valve: Moderately to severely calcified annulus. There was    mild regurgitation.  - Left atrium: The atrium was mildly dilated.  - Right atrium: The atrium was mildly dilated.  - Tricuspid valve: There was mild regurgitation.  - Pulmonary arteries: PA peak pressure: 40 mm Hg (S).     Risk Assessment/Calculations:         ASSESSMENT:    1. Dyspnea on exertion   2. Essential hypertension   3. History of syncope   4. Hyperlipidemia, unspecified hyperlipidemia type   5. Morbid obesity ([place]      PLAN:  In order of problems listed above:  Dyspnea on exertion chronic but increased recently.  BNP slightly elevated 122.8 02/06/2020 with recent echo 03/06/2020 normal LVEF 60 to 65% with moderate LVH, rvsp 44.2 mmHg, moderate mitral stenosis.  2 g sodium diet.  We will try low-dose Lasix 20 mg daily and potassium 10 mEq daily.  Weight loss.  Follow-up in 1 month with be met.  HTN blood pressure controlled on metoprolol  History of syncope-holter monitor ok no recurrence  HLD LDL 101- 02/06/20 on Zetia  Obesity patient has gained 20 pounds in the past year.  Recommend weight loss.  Offered referral to weight loss center or weight watchers.  Patient prefers to try to do it on her own.   Shared Decision Making/Informed Consent        Medication Adjustments/Labs and Tests Ordered: Current medicines are reviewed at length with the patient today.  Concerns regarding medicines are outlined above.  Medication changes, Labs and Tests ordered today are  listed in the Patient Instructions below. Patient Instructions   Medication Instructions:  Your physician has recommended you make the following change in your medication:   START: Furosemide 90m daily START: Potassium 144m daily  *If you need a refill on your  cardiac medications before your next appointment, please call your pharmacy*   Lab Work: None If you have labs (blood work) drawn today and your tests are completely normal, you will receive your results only by: . Marland KitchenyChart Message (if you have MyChart) OR . A paper copy in the mail If you have any lab test that is abnormal or we need to change your treatment, we will call you to review the results.  Follow-Up: [facility].  As part of our continuing mission to provide you with exceptional heart care, we have created designated Provider Care Teams.  These Care Teams include your primary Cardiologist (physician) and Advanced Practice Providers (APPs -  Physician Assistants and Nurse Practitioners) who all work together to provide you with the care you need, when you need it.  We recommend signing up for the patient portal called "MyChart".  Sign up information is provided on this After Visit Summary.  MyChart is used to connect with patients for Virtual Visits (Telemedicine).  Patients are able to view lab/test results, encounter notes, upcoming appointments, etc.  Non-urgent messages can be sent to your provider as well.   To learn more about what you can do with MyChart, go to [email or web address]   Your next appointment:   As scheduled with MiErmalinda BarriosPA  Other Instructions  Calorie Counting for Weight Loss Calories are units of energy. Your body needs a certain number of calories from food to keep going throughout the day. When you eat or drink more calories than your body needs, your body stores the extra calories mostly as fat. When you eat or drink fewer calories than your body needs, your body burns fat to get the energy it needs. Calorie counting means keeping track of how many calories you eat and drink each day. Calorie counting can be helpful if you need to lose weight. If you eat fewer calories than your body needs, you  should lose weight. Ask your health care provider what a healthy weight is for you. For calorie counting to work, you will need to eat the right number of calories each day to lose a healthy amount of weight per week. A dietitian can help you figure out how many calories you need in a day and will suggest ways to reach your calorie goal.  A healthy amount of weight to lose each week is usually 1-2 lb (0.5-0.9 kg). This usually means that your daily calorie intake should be reduced by 500-750 calories.  Eating 1,200-1,500 calories a day can help most women lose weight.  Eating 1,500-1,800 calories a day can help most men lose weight. What do I need to know about calorie counting? Work with your health care provider or dietitian to determine how many calories you should get each day. To meet your daily calorie goal, you will need to:  Find out how many calories are in each food that you would like to eat. Try to do this before you eat.  Decide how much of the food you plan to eat.  Keep a food log. Do this by writing down what you ate and how many calories it had. To successfully lose weight, it is  important to balance calorie counting with a healthy lifestyle that includes regular activity. Where do I find calorie information? The number of calories in a food can be found on a Nutrition Facts label. If a food does not have a Nutrition Facts label, try to look up the calories online or ask your dietitian for help. Remember that calories are listed per serving. If you choose to have more than one serving of a food, you will have to multiply the calories per serving by the number of servings you plan to eat. For example, the label on a package of bread might say that a serving size is 1 slice and that there are 90 calories in a serving. If you eat 1 slice, you will have eaten 90 calories. If you eat 2 slices, you will have eaten 180 calories.   How do I keep a food log? After each time that you eat,  record the following in your food log as soon as possible:  What you ate. Be sure to include toppings, sauces, and other extras on the food.  How much you ate. This can be measured in cups, ounces, or number of items.  How many calories were in each food and drink.  The total number of calories in the food you ate. Keep your food log near you, such as in a pocket-sized notebook or on an app or website on your mobile phone. Some programs will calculate calories for you and show you how many calories you have left to meet your daily goal. What are some portion-control tips?  Know how many calories are in a serving. This will help you know how many servings you can have of a certain food.  Use a measuring cup to measure serving sizes. You could also try weighing out portions on a kitchen scale. With time, you will be able to estimate serving sizes for some foods.  Take time to put servings of different foods on your favorite plates or in your favorite bowls and cups so you know what a serving looks like.  Try not to eat straight from a food's packaging, such as from a bag or box. Eating straight from the package makes it hard to see how much you are eating and can lead to overeating. Put the amount you would like to eat in a cup or on a plate to make sure you are eating the right portion.  Use smaller plates, glasses, and bowls for smaller portions and to prevent overeating.  Try not to multitask. For example, avoid watching TV or using your computer while eating. If it is time to eat, sit down at a table and enjoy your food. This will help you recognize when you are full. It will also help you be more mindful of what and how much you are eating. What are tips for following this plan? Reading food labels  Check the calorie count compared with the serving size. The serving size may be smaller than what you are used to eating.  Check the source of the calories. Try to choose foods that are  high in protein, fiber, and vitamins, and low in saturated fat, trans fat, and sodium. Shopping  Read nutrition labels while you shop. This will help you make healthy decisions about which foods to buy.  Pay attention to nutrition labels for low-fat or fat-free foods. These foods sometimes have the same number of calories or more calories than the full-fat versions. They also often have  added sugar, starch, or salt to make up for flavor that was removed with the fat.  Make a grocery list of lower-calorie foods and stick to it. Cooking  Try to cook your favorite foods in a healthier way. For example, try baking instead of frying.  Use low-fat dairy products. Meal planning  Use more fruits and vegetables. One-half of your plate should be fruits and vegetables.  Include lean proteins, such as chicken, [place], and fish. Lifestyle Each week, aim to do one of the following:  150 minutes of moderate exercise, such as walking.  75 minutes of vigorous exercise, such as running. General information  Know how many calories are in the foods you eat most often. This will help you calculate calorie counts faster.  Find a way of tracking calories that works for you. Get creative. Try different apps or programs if writing down calories does not work for you. What foods should I eat?  Eat nutritious foods. It is better to have a nutritious, high-calorie food, such as an avocado, than a food with few nutrients, such as a bag of potato chips.  Use your calories on foods and drinks that will fill you up and will not leave you hungry soon after eating. ? Examples of foods that fill you up are nuts and nut butters, vegetables, lean proteins, and high-fiber foods such as whole grains. High-fiber foods are foods with more than 5 g of fiber per serving.  Pay attention to calories in drinks. Low-calorie drinks include water and unsweetened drinks. The items listed above may not be a complete list of foods  and beverages you can eat. Contact a dietitian for more information.   What foods should I limit? Limit foods or drinks that are not good sources of vitamins, minerals, or protein or that are high in unhealthy fats. These include:  Candy.  Other sweets.  Sodas, specialty coffee drinks, alcohol, and juice. The items listed above may not be a complete list of foods and beverages you should avoid. Contact a dietitian for more information. How do I count calories when eating out?  Pay attention to portions. Often, portions are much larger when eating out. Try these tips to keep portions smaller: ? Consider sharing a meal instead of getting your own. ? If you get your own meal, eat only half of it. Before you start eating, ask for a container and put half of your meal into it. ? When available, consider ordering smaller portions from the menu instead of full portions.  Pay attention to your food and drink choices. Knowing the way food is cooked and what is included with the meal can help you eat fewer calories. ? If calories are listed on the menu, choose the lower-calorie options. ? Choose dishes that include vegetables, fruits, whole grains, low-fat dairy products, and lean proteins. ? Choose items that are boiled, broiled, grilled, or steamed. Avoid items that are buttered, battered, fried, or served with cream sauce. Items labeled as crispy are usually fried, unless stated otherwise. ? Choose water, low-fat milk, unsweetened iced tea, or other drinks without added sugar. If you want an alcoholic beverage, choose a lower-calorie option, such as a glass of wine or light beer. ? Ask for dressings, sauces, and syrups on the side. These are usually high in calories, so you should limit the amount you eat. ? If you want a salad, choose a garden salad and ask for grilled meats. Avoid extra toppings such as bacon, cheese,  or fried items. Ask for the dressing on the side, or ask for olive oil and  vinegar or lemon to use as dressing.  Estimate how many servings of a food you are given. Knowing serving sizes will help you be aware of how much food you are eating at restaurants. Where to find more information  Centers for Disease Control and Prevention: http://www.wolf.info/  U.S. Department of Agriculture: http://www.wilson-mendoza.org/ Summary  Calorie counting means keeping track of how many calories you eat and drink each day. If you eat fewer calories than your body needs, you should lose weight.  A healthy amount of weight to lose per week is usually 1-2 lb (0.5-0.9 kg). This usually means reducing your daily calorie intake by 500-750 calories.  The number of calories in a food can be found on a Nutrition Facts label. If a food does not have a Nutrition Facts label, try to look up the calories online or ask your dietitian for help.  Use smaller plates, glasses, and bowls for smaller portions and to prevent overeating.  Use your calories on foods and drinks that will fill you up and not leave you hungry shortly after a meal. This information is not intended to replace advice given to you by your health care provider. Make sure you discuss any questions you have with your health care provider. Document Revised: 02/02/2019 Document Reviewed: 02/02/2019 Elsevier Patient Education  2021 [place], Ermalinda Barrios, [place]  04/09/2020 1:31 PM    [facility] [place], [place], [place]  [address] Phone: [phone]; Fax: [phone]

## 2020-04-09 ENCOUNTER — Encounter: Payer: Self-pay | Admitting: Physician Assistant

## 2020-04-09 ENCOUNTER — Other Ambulatory Visit: Payer: Self-pay

## 2020-04-09 ENCOUNTER — Ambulatory Visit (INDEPENDENT_AMBULATORY_CARE_PROVIDER_SITE_OTHER): Payer: Medicare Other | Admitting: Physician Assistant

## 2020-04-09 VITALS — BP 120/80 | HR 88 | Ht 60.0 in | Wt 222.6 lb

## 2020-04-09 DIAGNOSIS — R0609 Other forms of dyspnea: Secondary | ICD-10-CM

## 2020-04-09 DIAGNOSIS — Z87898 Personal history of other specified conditions: Secondary | ICD-10-CM

## 2020-04-09 DIAGNOSIS — R0600 Dyspnea, unspecified: Secondary | ICD-10-CM | POA: Diagnosis not present

## 2020-04-09 DIAGNOSIS — E785 Hyperlipidemia, unspecified: Secondary | ICD-10-CM

## 2020-04-09 DIAGNOSIS — I10 Essential (primary) hypertension: Secondary | ICD-10-CM

## 2020-04-09 MED ORDER — FUROSEMIDE 20 MG PO TABS: 20.0000 mg | ORAL_TABLET | Freq: Every day | ORAL | 3 refills | Status: DC

## 2020-04-09 MED ORDER — POTASSIUM CHLORIDE CRYS ER 20 MEQ PO TBCR: 20.0000 meq | EXTENDED_RELEASE_TABLET | Freq: Every day | ORAL | 3 refills | Status: DC

## 2020-04-09 NOTE — Patient Instructions (Signed)
Medication Instructions:  Your physician has recommended you make the following change in your medication:   START: Furosemide 20mg  daily START: Potassium 3meq daily  *If you need a refill on your cardiac medications before your next appointment, please call your pharmacy*   Lab Work: None If you have labs (blood work) drawn today and your tests are completely normal, you will receive your results only by: Marland Kitchen MyChart Message (if you have MyChart) OR . A paper copy in the mail If you have any lab test that is abnormal or we need to change your treatment, we will call you to review the results.  Follow-Up: At [facility], you and your health needs are our priority.  As part of our continuing mission to provide you with exceptional heart care, we have created designated Provider Care Teams.  These Care Teams include your primary Cardiologist (physician) and Advanced Practice Providers (APPs -  Physician Assistants and Nurse Practitioners) who all work together to provide you with the care you need, when you need it.  We recommend signing up for the patient portal called "MyChart".  Sign up information is provided on this After Visit Summary.  MyChart is used to connect with patients for Virtual Visits (Telemedicine).  Patients are able to view lab/test results, encounter notes, upcoming appointments, etc.  Non-urgent messages can be sent to your provider as well.   To learn more about what you can do with MyChart, go to [email or web address].    Your next appointment:   As scheduled with Ermalinda Barrios, PA  Other Instructions  Calorie Counting for Weight Loss Calories are units of energy. Your body needs a certain number of calories from food to keep going throughout the day. When you eat or drink more calories than your body needs, your body stores the extra calories mostly as fat. When you eat or drink fewer calories than your body needs, your body burns fat to get the energy it  needs. Calorie counting means keeping track of how many calories you eat and drink each day. Calorie counting can be helpful if you need to lose weight. If you eat fewer calories than your body needs, you should lose weight. Ask your health care provider what a healthy weight is for you. For calorie counting to work, you will need to eat the right number of calories each day to lose a healthy amount of weight per week. A dietitian can help you figure out how many calories you need in a day and will suggest ways to reach your calorie goal.  A healthy amount of weight to lose each week is usually 1-2 lb (0.5-0.9 kg). This usually means that your daily calorie intake should be reduced by 500-750 calories.  Eating 1,200-1,500 calories a day can help most women lose weight.  Eating 1,500-1,800 calories a day can help most men lose weight. What do I need to know about calorie counting? Work with your health care provider or dietitian to determine how many calories you should get each day. To meet your daily calorie goal, you will need to:  Find out how many calories are in each food that you would like to eat. Try to do this before you eat.  Decide how much of the food you plan to eat.  Keep a food log. Do this by writing down what you ate and how many calories it had. To successfully lose weight, it is important to balance calorie counting with a healthy lifestyle that includes  regular activity. Where do I find calorie information? The number of calories in a food can be found on a Nutrition Facts label. If a food does not have a Nutrition Facts label, try to look up the calories online or ask your dietitian for help. Remember that calories are listed per serving. If you choose to have more than one serving of a food, you will have to multiply the calories per serving by the number of servings you plan to eat. For example, the label on a package of bread might say that a serving size is 1 slice and  that there are 90 calories in a serving. If you eat 1 slice, you will have eaten 90 calories. If you eat 2 slices, you will have eaten 180 calories.   How do I keep a food log? After each time that you eat, record the following in your food log as soon as possible:  What you ate. Be sure to include toppings, sauces, and other extras on the food.  How much you ate. This can be measured in cups, ounces, or number of items.  How many calories were in each food and drink.  The total number of calories in the food you ate. Keep your food log near you, such as in a pocket-sized notebook or on an app or website on your mobile phone. Some programs will calculate calories for you and show you how many calories you have left to meet your daily goal. What are some portion-control tips?  Know how many calories are in a serving. This will help you know how many servings you can have of a certain food.  Use a measuring cup to measure serving sizes. You could also try weighing out portions on a kitchen scale. With time, you will be able to estimate serving sizes for some foods.  Take time to put servings of different foods on your favorite plates or in your favorite bowls and cups so you know what a serving looks like.  Try not to eat straight from a food's packaging, such as from a bag or box. Eating straight from the package makes it hard to see how much you are eating and can lead to overeating. Put the amount you would like to eat in a cup or on a plate to make sure you are eating the right portion.  Use smaller plates, glasses, and bowls for smaller portions and to prevent overeating.  Try not to multitask. For example, avoid watching TV or using your computer while eating. If it is time to eat, sit down at a table and enjoy your food. This will help you recognize when you are full. It will also help you be more mindful of what and how much you are eating. What are tips for following this  plan? Reading food labels  Check the calorie count compared with the serving size. The serving size may be smaller than what you are used to eating.  Check the source of the calories. Try to choose foods that are high in protein, fiber, and vitamins, and low in saturated fat, trans fat, and sodium. Shopping  Read nutrition labels while you shop. This will help you make healthy decisions about which foods to buy.  Pay attention to nutrition labels for low-fat or fat-free foods. These foods sometimes have the same number of calories or more calories than the full-fat versions. They also often have added sugar, starch, or salt to make up for flavor that  was removed with the fat.  Make a grocery list of lower-calorie foods and stick to it. Cooking  Try to cook your favorite foods in a healthier way. For example, try baking instead of frying.  Use low-fat dairy products. Meal planning  Use more fruits and vegetables. One-half of your plate should be fruits and vegetables.  Include lean proteins, such as chicken, [place], and fish. Lifestyle Each week, aim to do one of the following:  150 minutes of moderate exercise, such as walking.  75 minutes of vigorous exercise, such as running. General information  Know how many calories are in the foods you eat most often. This will help you calculate calorie counts faster.  Find a way of tracking calories that works for you. Get creative. Try different apps or programs if writing down calories does not work for you. What foods should I eat?  Eat nutritious foods. It is better to have a nutritious, high-calorie food, such as an avocado, than a food with few nutrients, such as a bag of potato chips.  Use your calories on foods and drinks that will fill you up and will not leave you hungry soon after eating. ? Examples of foods that fill you up are nuts and nut butters, vegetables, lean proteins, and high-fiber foods such as whole grains.  High-fiber foods are foods with more than 5 g of fiber per serving.  Pay attention to calories in drinks. Low-calorie drinks include water and unsweetened drinks. The items listed above may not be a complete list of foods and beverages you can eat. Contact a dietitian for more information.   What foods should I limit? Limit foods or drinks that are not good sources of vitamins, minerals, or protein or that are high in unhealthy fats. These include:  Candy.  Other sweets.  Sodas, specialty coffee drinks, alcohol, and juice. The items listed above may not be a complete list of foods and beverages you should avoid. Contact a dietitian for more information. How do I count calories when eating out?  Pay attention to portions. Often, portions are much larger when eating out. Try these tips to keep portions smaller: ? Consider sharing a meal instead of getting your own. ? If you get your own meal, eat only half of it. Before you start eating, ask for a container and put half of your meal into it. ? When available, consider ordering smaller portions from the menu instead of full portions.  Pay attention to your food and drink choices. Knowing the way food is cooked and what is included with the meal can help you eat fewer calories. ? If calories are listed on the menu, choose the lower-calorie options. ? Choose dishes that include vegetables, fruits, whole grains, low-fat dairy products, and lean proteins. ? Choose items that are boiled, broiled, grilled, or steamed. Avoid items that are buttered, battered, fried, or served with cream sauce. Items labeled as crispy are usually fried, unless stated otherwise. ? Choose water, low-fat milk, unsweetened iced tea, or other drinks without added sugar. If you want an alcoholic beverage, choose a lower-calorie option, such as a glass of wine or light beer. ? Ask for dressings, sauces, and syrups on the side. These are usually high in calories, so you should  limit the amount you eat. ? If you want a salad, choose a garden salad and ask for grilled meats. Avoid extra toppings such as bacon, cheese, or fried items. Ask for the dressing on the side,  or ask for olive oil and vinegar or lemon to use as dressing.  Estimate how many servings of a food you are given. Knowing serving sizes will help you be aware of how much food you are eating at restaurants. Where to find more information  Centers for Disease Control and Prevention: http://www.wolf.info/  [place] Department of Agriculture: http://www.wilson-mendoza.org/ Summary  Calorie counting means keeping track of how many calories you eat and drink each day. If you eat fewer calories than your body needs, you should lose weight.  A healthy amount of weight to lose per week is usually 1-2 lb (0.5-0.9 kg). This usually means reducing your daily calorie intake by 500-750 calories.  The number of calories in a food can be found on a Nutrition Facts label. If a food does not have a Nutrition Facts label, try to look up the calories online or ask your dietitian for help.  Use smaller plates, glasses, and bowls for smaller portions and to prevent overeating.  Use your calories on foods and drinks that will fill you up and not leave you hungry shortly after a meal. This information is not intended to replace advice given to you by your health care provider. Make sure you discuss any questions you have with your health care provider. Document Revised: 02/02/2019 Document Reviewed: 02/02/2019 Elsevier Patient Education  2021 [employer].

## 2020-04-12 ENCOUNTER — Telehealth: Payer: Self-pay | Admitting: Physician Assistant

## 2020-04-12 NOTE — Telephone Encounter (Signed)
Ok to stop and see how she feels. She should also stop the K as this may have been contributing to her nausea and decreased appetite and is not needed if she is not on furosemide.  Will let michele add anything additional

## 2020-04-12 NOTE — Telephone Encounter (Signed)
    Pt c/o medication issue:  1. Name of Medication: furosemide (LASIX) 20 MG tablet  2. How are you currently taking this medication (dosage and times per day)? Take 1 tablet (20 mg total) by mouth daily.  3. Are you having a reaction (difficulty breathing--STAT)?   4. What is your medication issue? Pt said she is having reaction with this medication, she said her dizziness becomes worst, also have a headache and nausea and vomiting

## 2020-04-12 NOTE — Telephone Encounter (Signed)
Called patient and notified her of Pharm D recommendations.  She verbalizes understanding.

## 2020-04-12 NOTE — Telephone Encounter (Signed)
Called patient to in regards to lasix.  She reports that she has taken lasix for 3 days.  Per patient she started having a HA, nausea, and decreased appetite yesterday 04/11/20.    She also reports that she is dizzy on a daily basis but dizziness has gotten worse.  She denies difficulty breathing,  Having seasonal allergies or coming into contact with anyone who has been sick.  Her last BP was 120/70- 80.  She expressed that she will not take this medication anymore.  I went over safety precautions with her regarding dizziness.

## 2020-04-30 ENCOUNTER — Ambulatory Visit
Admission: RE | Admit: 2020-04-30 | Discharge: 2020-04-30 | Disposition: A | Discharge status: Home / Self Care | Payer: Medicare Other | Source: Ambulatory Visit | Attending: Pulmonary Disease | Admitting: Pulmonary Disease

## 2020-04-30 DIAGNOSIS — J189 Pneumonia, unspecified organism: Secondary | ICD-10-CM

## 2020-05-01 NOTE — Progress Notes (Signed)
Called and went over CT results per Dr Alva with patient. All questions answered and patient expressed full understanding. Nothing further needed at this time.

## 2020-05-06 NOTE — Progress Notes (Signed)
Cardiology Office Note    Date:  05/15/2020   ID:  [name], DOB 1931/11/05, MRN [id number]   PCP:  Nicolette Bang, DO   [place]  Cardiologist:  Sinclair Grooms, MD  Advanced Practice Provider:  No care team member to display Electrophysiologist:  None   [id number]   Chief Complaint  Patient presents with  . Follow-up    History of Present Illness:  Sydney Booth is a 87 y.o. female with history of hypertension, HLD, OSA on CPAP.  She saw Dr. Tamala Julian 01/2018 after an episode of syncope resulting in a fall with right orbital fracture.   Holter monitor 02/2018 no A. fib.  1 brief run of SVT less than 6 beats.  Occasional PACs and PVCs.   I saw patient 04/09/20 for increased dyspnea and echo ordered by PCP-2D echo 03/06/2020 normal LVEF 60 to 65% with mildly elevated pulmonary artery systolic pressures RVSP 44 mmHg mild MR, moderate mitral stenosis.  Saw pulmonary and thought she may have had covid19 because of loss of taste/smell. CXR peribronchial thickening and  CT do not see results below did have one-vessel coronary atherosclerosis in the LAD. Gets short of breath going up stairs and shopping. Has been chronic but seems to have worsened. Had gained 20 lbs. Has neuropathy and can't walk a lot. I ordered low dose lasix and encouraged weight loss.  Patient says she didn't take the lasix because it made her dizzy and nauseated. Tried it for 3 days. Breathing is improving some-still DOE.Denies chest pain/tightness. BP 117/71 this am and similar each day. BP up today after walking in here.   Past Medical History:  Diagnosis Date  . Arthritis   . Blood transfusion   . Cervical vertebral fracture (HCC)    fued on its on  . Closed head injury    as a chlid  . Fall 12/22/2017   right eye bruised  . Gastric ulcer   . GERD (gastroesophageal reflux disease)   . H/O hiatal hernia   . Headache(784.0)   . Hyperlipemia   . Hypertension   . Left  carpal tunnel syndrome 06/08/2018  . Mitral valve prolapse   . Obesity   . OSA on CPAP   . Osteopenia   . Rotator cuff tear    Left  . Seasonal allergies   . Shortness of breath    With activity  . Ulcer 1991   [place]     Past Surgical History:  Procedure Laterality Date  . BIospy     . Biospy      Uterus wall  . Carparal Tunnel    . CATARACT EXTRACTION    . Colonscopy    . EYE SURGERY     Right 2011, Left 2008  . LIPOMA EXCISION     FROM BACK   . THUMB FUSION    . TOTAL KNEE ARTHROPLASTY  03/24/2011   Procedure: TOTAL KNEE ARTHROPLASTY;  Surgeon: Hessie Dibble, MD;  Location: [facility];  Service: Orthopedics;  Laterality: Left;  with revision tibial component  . TUBAL LIGATION      Current Medications: Current Meds  Medication Sig  . amoxicillin (AMOXIL) 500 MG capsule Take 4 capsules (2000 mg) prior to dental procedures.  Marland Kitchen aspirin 81 MG tablet Take 81 mg by mouth daily.  . calcium carbonate (OS-CAL) 600 MG TABS Take 600 mg by mouth daily.  Marland Kitchen CINNAMON PO Take 500 mg by mouth  2 (two) times daily.  Marland Kitchen ezetimibe (ZETIA) 10 MG tablet Take 10 mg by mouth daily.  . metoprolol tartrate (LOPRESSOR) 25 MG tablet Take 25 mg by mouth 2 (two) times daily.  . Omega-3 Fatty Acids (FISH OIL) 1000 MG CPDR Take 1,000 mg by mouth daily.  Marland Kitchen omeprazole (PRILOSEC) 40 MG capsule Take 40 mg by mouth daily.  Marland Kitchen QUERCETIN PO Take 1,500 mg by mouth daily.  . Turmeric 500 MG CAPS Take by mouth daily.  Marland Kitchen VITAMIN A PO Take 10,000 mg by mouth.  . vitamin B-12 (CYANOCOBALAMIN) 1000 MCG tablet Take 1,000 mcg by mouth daily.  Marland Kitchen VITAMIN D, CHOLECALCIFEROL, PO Take 1 tablet by mouth daily.  . Zinc Oxide 40 % PSTE Apply 1 application topically as needed.  . [DISCONTINUED] terconazole (TERAZOL 3) 0.8 % vaginal cream Place 1 applicator vaginally at bedtime.     Allergies:   Cymbalta [duloxetine hcl], Gabapentin, Sulfa antibiotics, and Zocor [simvastatin]   Social History   Socioeconomic History  .  Marital status: Widowed    Spouse name: Not on file  . Number of children: 5  . Years of education: 5  . Highest education level: Not on file  Occupational History  . Occupation: Retired   Tobacco Use  . Smoking status: Never Smoker  . Smokeless tobacco: Never Used  Vaping Use  . Vaping Use: Never used  Substance and Sexual Activity  . Alcohol use: No    Alcohol/week: 0.0 standard drinks  . Drug use: No  . Sexual activity: Not on file  Other Topics Concern  . Not on file  Social History Narrative   Patient lives at home with daughter   Patient is retired   Patient is widowed   Patient has a high school education   Patient has 5 children    Social Determinants of Radio broadcast assistant Strain: Not on file  Food Insecurity: Not on file  Transportation Needs: Not on file  Physical Activity: Not on file  Stress: Not on file  Social Connections: Not on file     Family History:  The patient's family history includes CVA in her mother; Cancer in her father; Pulmonary embolism in her brother; Sleep apnea in her brother.   ROS:   Please see the history of present illness.    ROS All other systems reviewed and are negative.   PHYSICAL EXAM:   VS:  BP (!) 164/80   Pulse 73   Ht 5' (1.524 m)   Wt 221 lb (100.2 kg)   SpO2 99%   BMI 43.16 kg/m   Physical Exam  GEN: Obese, in no acute distress  Neck: no JVD, carotid bruits, or masses Cardiac:RRR; no murmurs, rubs, or gallops  Respiratory:  clear to auscultation bilaterally, normal work of breathing GI: soft, nontender, nondistended, + BS Ext: trace edema,without cyanosis, clubbing, Good distal pulses bilaterally Neuro:  Alert and Oriented x 3 Psych: euthymic mood, full affect  Wt Readings from Last 3 Encounters:  05/15/20 221 lb (100.2 kg)  05/13/20 218 lb (98.9 kg)  04/09/20 222 lb 9.6 oz (101 kg)      Studies/Labs Reviewed:   EKG:  EKG is not ordered today.   Recent Labs: 02/06/2020: ALT 13; BNP 122.8;  BUN 11; Creatinine, Ser 0.78; Hemoglobin 13.8; Platelets 291; Potassium 4.3; Sodium 138; TSH 3.090   Lipid Panel    Component Value Date/Time   CHOL 187 02/06/2020 1502   TRIG 108 02/06/2020 1502  HDL 67 02/06/2020 1502   CHOLHDL 2.8 02/06/2020 1502   LDLCALC 101 (H) 02/06/2020 1502    Additional studies/ records that were reviewed today include:   CT of the chest 04/30/2020 IMPRESSION: 1. Mild patchy air trapping in both lungs, indicative of small airways disease. 2. Mild cylindrical bronchiolectasis in the basilar lower lobes, right greater than left. 3. Scattered thin parenchymal bands and minimal subpleural reticulation at both lung bases, favoring nonspecific postinfectious/postinflammatory scarring. No compelling findings of interstitial lung disease at this time. 4. A few tiny solid pulmonary nodules, largest 3 mm. No follow-up needed if patient is low-risk (and has no known or suspected primary neoplasm). Non-contrast chest CT can be considered in 12 months if patient is high-risk. This recommendation follows the consensus statement: Guidelines for Management of Incidental Pulmonary Nodules Detected on CT Images: From the Fleischner Society 2017; Radiology 2017; 284:228-243. 5. One vessel coronary atherosclerosis. 6. Small hiatal hernia. 7. Aortic Atherosclerosis (ICD10-I70.0).     Electronically Signed   By: Ilona Sorrel M.D.   On: 05/01/2020 08:52    Holter monitor 02/2018 Study Highlights      Normal sinus rhythm  No atrial fibrillation or significant arrhythmias.  Occasional PACs and PVCs.  No correlation between arrhythmia and symptoms.  1 brief run of SVT less than 6 beats.  Intermittent interventricular conduction delay/bundle branch block.   No sustained arrhythmias.  One episode of nonsustained SVT less than 8 beats.   2D echo 03/06/2020 IMPRESSIONS     1. Left ventricular ejection fraction, by estimation, is 60 to 65%. The  left  ventricle has normal function. The left ventricle has no regional  wall motion abnormalities. There is moderate left ventricular hypertrophy.  Left ventricular diastolic  parameters are indeterminate.   2. Right ventricular systolic function is normal. The right ventricular  size is normal. There is mildly elevated pulmonary artery systolic  pressure. The estimated right ventricular systolic pressure is 82.9 mmHg.   3. The mitral valve is abnormal. Mild mitral valve regurgitation.  Moderate mitral stenosis. MG 7 mmHg at HR 92 bpm. Severe mitral annular  calcification.   4. The aortic valve was not well visualized. Aortic valve regurgitation  is not visualized. Mild to moderate aortic valve sclerosis/calcification  is present, without any evidence of aortic stenosis.   5. The inferior vena cava is normal in size with greater than 50%  respiratory variability, suggesting right atrial pressure of 3 mmHg.   Comparison(s): 12/23/17 EF 65-70%. PA pressure 57mmHg.    12/23/2017 Study Conclusions   - Left ventricle: The cavity size was normal. There was mild    concentric hypertrophy. Systolic function was vigorous. The    estimated ejection fraction was in the range of 65% to 70%. Wall    motion was normal; there were no regional wall motion    abnormalities. Diastolic function assessment limited due to    severity of mitral annular calcification.  - Mitral valve: Moderately to severely calcified annulus. There was    mild regurgitation.  - Left atrium: The atrium was mildly dilated.  - Right atrium: The atrium was mildly dilated.  - Tricuspid valve: There was mild regurgitation.  - Pulmonary arteries: PA peak pressure: 40 mm Hg (S).         Risk Assessment/Calculations:         ASSESSMENT:    1. Dyspnea on exertion   2. Essential hypertension   3. History of syncope   4. Hyperlipidemia, unspecified  hyperlipidemia type   5. Morbid obesity ([place])      PLAN:  In order of  problems listed above:  Dyspnea on exertion chronic but doing better.  BNP slightly elevated 122.8 02/06/2020 with recent echo 03/06/2020 normal LVEF 60 to 65% with moderate LVH, rvsp 44.2 mmHg, moderate mitral stenosis.  2 g sodium diet.  She didn't tolerate low-dose Lasix 20 mg daily so will leave off.  Weight loss. Symptoms improving. Has atherosclerosis on CT scan so could have underlying CAD-no chest pain. Will not order NST at this time since symptoms improving.   HTN blood pressure controlled on metoprolol   History of syncope-holter monitor ok no recurrence   HLD LDL 101- 02/06/20 on Zetia   Obesity patient has gained 20 pounds in the past year.  Recommend weight loss.  Offered referral to weight loss center or weight watchers.  Patient prefers to try to do it on her own.     Shared Decision Making/Informed Consent        Medication Adjustments/Labs and Tests Ordered: Current medicines are reviewed at length with the patient today.  Concerns regarding medicines are outlined above.  Medication changes, Labs and Tests ordered today are listed in the Patient Instructions below. Patient Instructions  Medication Instructions:  Your physician recommends that you continue on your current medications as directed. Please refer to the Current Medication list given to you today.  *If you need a refill on your cardiac medications before your next appointment, please call your pharmacy*   Lab Work: None If you have labs (blood work) drawn today and your tests are completely normal, you will receive your results only by: Marland Kitchen MyChart Message (if you have MyChart) OR . A paper copy in the mail If you have any lab test that is abnormal or we need to change your treatment, we will call you to review the results.   Follow-Up: At [facility], you and your health needs are our priority.  As part of our continuing mission to provide you with exceptional heart care, we have created designated  Provider Care Teams.  These Care Teams include your primary Cardiologist (physician) and Advanced Practice Providers (APPs -  Physician Assistants and Nurse Practitioners) who all work together to provide you with the care you need, when you need it.  We recommend signing up for the patient portal called "MyChart".  Sign up information is provided on this After Visit Summary.  MyChart is used to connect with patients for Virtual Visits (Telemedicine).  Patients are able to view lab/test results, encounter notes, upcoming appointments, etc.  Non-urgent messages can be sent to your provider as well.   To learn more about what you can do with MyChart, go to [email or web address].    Your next appointment:   6 month(s)  The format for your next appointment:   In Person  Provider:   You may see Sinclair Grooms, MD or one of the following Advanced Practice Providers on your designated Care Team:    Kathyrn Drown, NP      Signed, Ermalinda Barrios, PA-C  05/15/2020 12:06 PM    [address], [place], [place]  [address] Phone: [phone]; Fax: [phone]

## 2020-05-07 ENCOUNTER — Ambulatory Visit: Payer: PRIVATE HEALTH INSURANCE | Admitting: Internal Medicine

## 2020-05-13 ENCOUNTER — Other Ambulatory Visit: Payer: Self-pay

## 2020-05-13 ENCOUNTER — Encounter: Payer: Self-pay | Admitting: Internal Medicine

## 2020-05-13 ENCOUNTER — Ambulatory Visit (INDEPENDENT_AMBULATORY_CARE_PROVIDER_SITE_OTHER): Payer: Medicare Other | Admitting: Internal Medicine

## 2020-05-13 VITALS — BP 124/69 | HR 78 | Temp 97.2°F | Resp 20 | Wt 218.0 lb

## 2020-05-13 DIAGNOSIS — B3731 Acute candidiasis of vulva and vagina: Secondary | ICD-10-CM

## 2020-05-13 DIAGNOSIS — I10 Essential (primary) hypertension: Secondary | ICD-10-CM | POA: Diagnosis not present

## 2020-05-13 DIAGNOSIS — Z8679 Personal history of other diseases of the circulatory system: Secondary | ICD-10-CM

## 2020-05-13 DIAGNOSIS — B373 Candidiasis of vulva and vagina: Secondary | ICD-10-CM

## 2020-05-13 MED ORDER — TERCONAZOLE 0.8 % VA CREA: 1.0000 | TOPICAL_CREAM | Freq: Every day | VAGINAL | 2 refills | Status: DC

## 2020-05-13 MED ORDER — AMOXICILLIN 500 MG PO CAPS: ORAL_CAPSULE | ORAL | 0 refills | Status: DC

## 2020-05-13 NOTE — Progress Notes (Signed)
Request medication for dental work Amoxicillin  CAPSULES teconozaole vaginal cream with refills  Dealing with lots of vertigo.

## 2020-05-13 NOTE — Progress Notes (Signed)
  Subjective:    Sydney Booth - 87 y.o. female MRN [id number]  Date of birth: [birth year]  HPI  Emmalia Heyboer is here for follow up. Reports that her BP has been pretty good lately. Has been 105-115/70-75 at home for the last several weeks. Has dental procedures coming up and asks for refill of Amoxicillin for dental prophylaxis due to her history of MVP. Also asks for refill of vaginal cream for yeast infections.      Health Maintenance: Health Maintenance Due  Topic Date Due  . DEXA SCAN  Never done    -  reports that she has never smoked. She has never used smokeless tobacco. - Review of Systems: Per HPI. - Past Medical History: Patient Active Problem List   Diagnosis Date Noted  . Abnormal CXR 04/01/2020  . Left carpal tunnel syndrome 06/08/2018  . Peripheral neuropathy   . Syncope   . Blurred vision 11/24/2013  . Mixed hyperlipidemia 02/09/2013  . 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 HTN (hypertension) 03/24/2011    Class: Chronic   - Medications: reviewed and updated   Objective:   Physical Exam BP 124/69   Pulse 78   Temp (!) 97.2 F (36.2 C)   Resp 20   Wt 218 lb (98.9 kg)   SpO2 96%   BMI 42.58 kg/m  Physical Exam Constitutional:      General: She is not in acute distress.    Appearance: She is not diaphoretic.  Cardiovascular:     Rate and Rhythm: Normal rate.  Pulmonary:     Effort: Pulmonary effort is normal. No respiratory distress.  Musculoskeletal:        General: Normal range of motion.  Skin:    General: Skin is warm and dry.  Neurological:     Mental Status: She is alert and oriented to person, place, and time.  Psychiatric:        Mood and Affect: Affect normal.        Judgment: Judgment normal.            Assessment & Plan:   1. Primary hypertension BP well controlled. Asymptomatic. Continue current regimen.   2. History of mitral valve prolapse - amoxicillin (AMOXIL) 500 MG capsule; Take 4 capsules (2000 mg)  prior to dental procedures.  Dispense: 28 capsule; Refill: 0  3. Yeast vaginitis - terconazole (TERAZOL 3) 0.8 % vaginal cream; Place 1 applicator vaginally at bedtime.  Dispense: 20 g; Refill: Johnston, D.O. 05/13/2020, 1:55 PM Primary Care at [facility]

## 2020-05-15 ENCOUNTER — Other Ambulatory Visit: Payer: Self-pay

## 2020-05-15 ENCOUNTER — Encounter: Payer: Self-pay | Admitting: Physician Assistant

## 2020-05-15 ENCOUNTER — Ambulatory Visit (INDEPENDENT_AMBULATORY_CARE_PROVIDER_SITE_OTHER): Payer: Medicare Other | Admitting: Physician Assistant

## 2020-05-15 VITALS — BP 164/80 | HR 73 | Ht 60.0 in | Wt 221.0 lb

## 2020-05-15 DIAGNOSIS — I10 Essential (primary) hypertension: Secondary | ICD-10-CM

## 2020-05-15 DIAGNOSIS — R0600 Dyspnea, unspecified: Secondary | ICD-10-CM | POA: Diagnosis not present

## 2020-05-15 DIAGNOSIS — R0609 Other forms of dyspnea: Secondary | ICD-10-CM

## 2020-05-15 DIAGNOSIS — Z87898 Personal history of other specified conditions: Secondary | ICD-10-CM | POA: Diagnosis not present

## 2020-05-15 DIAGNOSIS — E785 Hyperlipidemia, unspecified: Secondary | ICD-10-CM

## 2020-05-15 NOTE — Patient Instructions (Signed)
Medication Instructions:  Your physician recommends that you continue on your current medications as directed. Please refer to the Current Medication list given to you today.  *If you need a refill on your cardiac medications before your next appointment, please call your pharmacy*   Lab Work: None If you have labs (blood work) drawn today and your tests are completely normal, you will receive your results only by: Marland Kitchen MyChart Message (if you have MyChart) OR . A paper copy in the mail If you have any lab test that is abnormal or we need to change your treatment, we will call you to review the results.   Follow-Up: At [facility], you and your health needs are our priority.  As part of our continuing mission to provide you with exceptional heart care, we have created designated Provider Care Teams.  These Care Teams include your primary Cardiologist (physician) and Advanced Practice Providers (APPs -  Physician Assistants and Nurse Practitioners) who all work together to provide you with the care you need, when you need it.  We recommend signing up for the patient portal called "MyChart".  Sign up information is provided on this After Visit Summary.  MyChart is used to connect with patients for Virtual Visits (Telemedicine).  Patients are able to view lab/test results, encounter notes, upcoming appointments, etc.  Non-urgent messages can be sent to your provider as well.   To learn more about what you can do with MyChart, go to [email or web address].    Your next appointment:   6 month(s)  The format for your next appointment:   In Person  Provider:   You may see Sinclair Grooms, MD or one of the following Advanced Practice Providers on your designated Care Team:    Kathyrn Drown, NP

## 2020-06-21 ENCOUNTER — Telehealth: Payer: Self-pay | Admitting: Internal Medicine

## 2020-06-21 NOTE — Telephone Encounter (Signed)
Pt called stating she is having vaginal irritation and will be going out of town the first of next week. Pt is asking if nystop and fluconazole can be called in at   [place], [place] [facility] [place]  [address] [place], [place] [address]  Phone:  [phone]  Fax:  [phone]    If not pt is asking for a return call to let her know what she can do.

## 2020-06-24 NOTE — Telephone Encounter (Signed)
Called and informed patient. 

## 2020-06-24 NOTE — Telephone Encounter (Signed)
Please schedule appointment for evaluation

## 2020-06-24 NOTE — Telephone Encounter (Signed)
Pt called in asking if medication can be called in or does she need to be seen?

## 2020-07-10 ENCOUNTER — Other Ambulatory Visit: Payer: Self-pay

## 2020-07-10 ENCOUNTER — Ambulatory Visit
Admission: RE | Admit: 2020-07-10 | Discharge: 2020-07-10 | Disposition: A | Discharge status: Home / Self Care | Payer: Medicare Other | Source: Ambulatory Visit | Attending: Internal Medicine | Admitting: Internal Medicine

## 2020-07-10 DIAGNOSIS — E2839 Other primary ovarian failure: Secondary | ICD-10-CM

## 2020-07-12 ENCOUNTER — Telehealth: Payer: Self-pay | Admitting: Internal Medicine

## 2020-07-12 NOTE — Telephone Encounter (Signed)
  Pt of Dr. Juleen China called to ask if NP, Amy Minette Brine could review her bone density report from 07/10/20 since previous Provider is gone and if she give her an update on what to do. I've scheduled the next available appt  w/ NP per patient's requests. Please advise and thank you

## 2020-07-13 NOTE — Telephone Encounter (Signed)
Completed. Please refer to result note from 07/13/2020.

## 2020-07-13 NOTE — Progress Notes (Signed)
Your bone density returned and did show some osteopenia. This means the bones are not as thick as they once were, which is common in women after menopause.  This is not osteoporosis, but could lead to it.    We recommend a diet high in calcium (dark green leafy vegetables). We also recommend plenty of weight bearing exercise such as walking and resistance training as tolerated.     Please schedule a lab only visit to have vitamin D and calcium checked. If this returns low we will prescribe a vitamin D supplement and/or calcium supplement for added bone health.   Repeat bone density scan in 1 to 2 years.

## 2020-07-16 ENCOUNTER — Other Ambulatory Visit: Payer: Medicare Other

## 2020-07-16 ENCOUNTER — Other Ambulatory Visit: Payer: Self-pay

## 2020-07-16 DIAGNOSIS — M8580 Other specified disorders of bone density and structure, unspecified site: Secondary | ICD-10-CM

## 2020-07-16 DIAGNOSIS — E559 Vitamin D deficiency, unspecified: Secondary | ICD-10-CM

## 2020-07-17 LAB — CALCIUM: Calcium: 9.4 mg/dL (ref 8.7–10.3)

## 2020-07-17 LAB — VITAMIN D 25 HYDROXY (VIT D DEFICIENCY, FRACTURES): Vit D, 25-Hydroxy: 68.8 ng/mL (ref 30.0–100.0)

## 2020-07-17 NOTE — Progress Notes (Signed)
Please call patient with update.   Vitamin D normal.   Calcium normal.   Plan to repeat bone density scan in 1 year.

## 2020-08-09 ENCOUNTER — Ambulatory Visit
Admission: EM | Admit: 2020-08-09 | Discharge: 2020-08-09 | Disposition: A | Discharge status: Home / Self Care | Payer: Medicare Other | Attending: Urgent Care | Admitting: Urgent Care

## 2020-08-09 ENCOUNTER — Encounter: Payer: Self-pay | Admitting: Emergency Medicine

## 2020-08-09 ENCOUNTER — Other Ambulatory Visit: Payer: Self-pay

## 2020-08-09 DIAGNOSIS — M5030 Other cervical disc degeneration, unspecified cervical region: Secondary | ICD-10-CM

## 2020-08-09 DIAGNOSIS — I10 Essential (primary) hypertension: Secondary | ICD-10-CM

## 2020-08-09 DIAGNOSIS — Z87898 Personal history of other specified conditions: Secondary | ICD-10-CM

## 2020-08-09 DIAGNOSIS — M4310 Spondylolisthesis, site unspecified: Secondary | ICD-10-CM

## 2020-08-09 DIAGNOSIS — M79632 Pain in left forearm: Secondary | ICD-10-CM

## 2020-08-09 DIAGNOSIS — M79642 Pain in left hand: Secondary | ICD-10-CM

## 2020-08-09 DIAGNOSIS — I6789 Other cerebrovascular disease: Secondary | ICD-10-CM

## 2020-08-09 DIAGNOSIS — M4312 Spondylolisthesis, cervical region: Secondary | ICD-10-CM

## 2020-08-09 MED ORDER — ACETAMINOPHEN 325 MG PO TABS: 650.0000 mg | ORAL_TABLET | Freq: Four times a day (QID) | ORAL | 0 refills | Status: AC | PRN

## 2020-08-09 NOTE — ED Provider Notes (Signed)
[place]   MRN: [id number] DOB: 1931-05-16  Subjective:   Sydney Booth is a 87 y.o. female presenting for 1 day history of left forearm pain, left hand pain.  Patient states that she has been dealing with carpal tunnel syndrome for a while on the left hand, confirmed by chart review.  She is also had left rotator cuff problem also resolved.  States that she woke up with the symptoms out of her sleep.  The numbness and tingling is not new and has been consistent over her hand with her carpal tunnel syndrome.  However the pain was not consistent and extended over the hand of the palmar surface, radiated up into the right forearm.  When she felt the symptoms, patient stated that she also felt short of breath and dizzy.  She felt very concerned and feels like she has had panic attacks and asked.  Has discussed this with her PCP but has not helped with it.  Denies any active shortness of breath, chest pain, weakness, vision change, severe headache.  Has not taken any medications for relief.  She is on Lopressor for her hypertension.  No history of stroke.  However she does report having had a syncope in the past.  Chart review confirms that she had an MRI and showed chronic microvascular disease.  CT of the neck also showed diffuse degenerative disc disease at the cervical level with anterolisthesis and retrolisthesis.  No current facility-administered medications for this encounter.  Current Outpatient Medications:    amoxicillin (AMOXIL) 500 MG capsule, Take 4 capsules (2000 mg) prior to dental procedures., Disp: 28 capsule, Rfl: 0   aspirin 81 MG tablet, Take 81 mg by mouth daily., Disp: , Rfl:    calcium carbonate (OS-CAL) 600 MG TABS, Take 600 mg by mouth daily., Disp: , Rfl:    CINNAMON PO, Take 500 mg by mouth 2 (two) times daily., Disp: , Rfl:    ezetimibe (ZETIA) 10 MG tablet, Take 10 mg by mouth daily., Disp: , Rfl:    metoprolol tartrate (LOPRESSOR) 25 MG tablet, Take 25  mg by mouth 2 (two) times daily., Disp: , Rfl:    Omega-3 Fatty Acids (FISH OIL) 1000 MG CPDR, Take 1,000 mg by mouth daily., Disp: , Rfl:    omeprazole (PRILOSEC) 40 MG capsule, Take 40 mg by mouth daily., Disp: , Rfl:    QUERCETIN PO, Take 1,500 mg by mouth daily., Disp: , Rfl:    Turmeric 500 MG CAPS, Take by mouth daily., Disp: , Rfl:    VITAMIN A PO, Take 10,000 mg by mouth., Disp: , Rfl:    vitamin B-12 (CYANOCOBALAMIN) 1000 MCG tablet, Take 1,000 mcg by mouth daily., Disp: , Rfl:    VITAMIN D, CHOLECALCIFEROL, PO, Take 1 tablet by mouth daily., Disp: , Rfl:    Zinc Oxide 40 % PSTE, Apply 1 application topically as needed., Disp: 113 g, Rfl: 0   Allergies  Allergen Reactions   Cymbalta [Duloxetine Hcl]    Gabapentin     dizzy   Sulfa Antibiotics Hives   Zocor [Simvastatin]     Muscle aches    Past Medical History:  Diagnosis Date   Arthritis    Blood transfusion    Cervical vertebral fracture (HCC)    fued on its on   Closed head injury    as a chlid   Fall 12/22/2017   right eye bruised   Gastric ulcer    GERD (gastroesophageal reflux disease)  H/O hiatal hernia    Headache(784.0)    Hyperlipemia    Hypertension    Left carpal tunnel syndrome 06/08/2018   Mitral valve prolapse    Obesity    OSA on CPAP    Osteopenia    Rotator cuff tear    Left   Seasonal allergies    Shortness of breath    With activity   Ulcer 1991   [place]      Past Surgical History:  Procedure Laterality Date   BIospy      Biospy      Uterus wall   Carparal Tunnel     CATARACT EXTRACTION     Colonscopy     EYE SURGERY     Right 2011, Left 2008   LIPOMA EXCISION     FROM BACK    THUMB FUSION     TOTAL KNEE ARTHROPLASTY  03/24/2011   Procedure: TOTAL KNEE ARTHROPLASTY;  Surgeon: Hessie Dibble, MD;  Location: [facility];  Service: Orthopedics;  Laterality: Left;  with revision tibial component   TUBAL LIGATION      Family History  Problem Relation Age of Onset   CVA Mother     Cancer Father        COLON, LIVER AND PANCREAS   Sleep apnea Brother    Pulmonary embolism Brother    Anesthesia problems Neg Hx    Breast cancer Neg Hx     Social History   Tobacco Use   Smoking status: Never   Smokeless tobacco: Never  Vaping Use   Vaping Use: Never used  Substance Use Topics   Alcohol use: No    Alcohol/week: 0.0 standard drinks   Drug use: No    ROS   Objective:   Vitals: BP (!) 188/101 (BP Location: Left Arm)   Pulse 79   Temp 97.8 F (36.6 C) (Oral)   Resp 20   SpO2 95%   BP Readings from Last 3 Encounters:  08/09/20 (!) 188/101  05/15/20 (!) 164/80  05/13/20 124/69   BP recheck was 120/72 on recheck.   Physical Exam Constitutional:      General: She is not in acute distress.    Appearance: Normal appearance. She is well-developed. She is not ill-appearing, toxic-appearing or diaphoretic.  HENT:     Head: Normocephalic and atraumatic.     Nose: Nose normal.     Mouth/Throat:     Mouth: Mucous membranes are moist.     Pharynx: Oropharynx is clear.  Eyes:     General: No scleral icterus.    Extraocular Movements: Extraocular movements intact.     Pupils: Pupils are equal, round, and reactive to light.  Cardiovascular:     Rate and Rhythm: Normal rate and regular rhythm.     Pulses: Normal pulses.     Heart sounds: Normal heart sounds. No murmur heard.   No friction rub. No gallop.  Pulmonary:     Effort: Pulmonary effort is normal. No respiratory distress.     Breath sounds: Normal breath sounds. No stridor. No wheezing, rhonchi or rales.  Musculoskeletal:     Left forearm: No swelling, edema, deformity, lacerations, tenderness or bony tenderness.     Left wrist: No swelling, deformity, effusion, lacerations, tenderness, bony tenderness, snuff box tenderness or crepitus. Normal range of motion.     Left hand: No swelling, deformity, lacerations, tenderness or bony tenderness. Normal range of motion. Normal strength. Decreased  sensation. Normal capillary refill.  Cervical back: No swelling, edema, deformity, erythema, signs of trauma, lacerations, rigidity, spasms, torticollis, tenderness, bony tenderness or crepitus. No pain with movement. Normal range of motion.     Comments: Negative Lhermitte sign and Spurling maneuver.  Skin:    General: Skin is warm and dry.     Findings: No rash.  Neurological:     General: No focal deficit present.     Mental Status: She is alert and oriented to person, place, and time.     Cranial Nerves: No cranial nerve deficit.     Motor: No weakness.     Coordination: Coordination abnormal (Ambulates with a cane).     Gait: Gait normal.     Deep Tendon Reflexes: Reflexes normal.     Comments: Negative Romberg and pronator drift.  Psychiatric:        Mood and Affect: Mood normal.        Behavior: Behavior normal.        Thought Content: Thought content normal.        Judgment: Judgment normal.    ED ECG REPORT   Date: 08/09/2020  EKG Time: 12:06 PM  Rate: 79 bpm  Rhythm: normal sinus rhythm,  normal EKG, normal sinus rhythm, LBBB  Axis: Left  Intervals:none  ST&T Change: T wave flattening in lead aVL, ST depression in V6  Narrative Interpretation: Sinus rhythm at 79 bpm with left bundle branch block, ST changes as above.  Very comparable to previous EKG.     Assessment and Plan :   PDMP not reviewed this encounter.  1. Degenerative disc disease, cervical   2. Left forearm pain   3. Left hand pain   4. Anterolisthesis of cervical spine   5. Retrolisthesis of vertebrae   6. Essential hypertension   7. History of syncope   8. Cerebral microvascular disease     Blood pressure recheck was reassuring.  Patient has significant vascular risk factors but there is no sign of stroke on her exam and the blood pressure recheck was much better.  She does have a history of degenerative disc disease at the cervical level with anterolisthesis and retrolisthesis.  Discussed this  with patient and recommended that we maintain strict ER precautions.  I offered her steroid course to address neuropathic pain related to her spinal issues but patient declined.  Will use Tylenol.  [facility] and PCP as soon as possible. Counseled patient on potential for adverse effects with medications prescribed today, patient verbalized understanding.    Jaynee Eagles, PA-C 08/09/20 1245

## 2020-08-09 NOTE — Discharge Instructions (Addendum)
Please just use Tylenol at a dose of 500mg-650mg once every 6 hours as needed for your aches, pains, fevers. Do not use any nonsteroidal anti-inflammatories (NSAIDs) like ibuprofen, Motrin, naproxen, Aleve, etc. which are all available over-the-counter.   

## 2020-08-09 NOTE — ED Triage Notes (Signed)
History of left arm pain and numbness, torn rotator cuff, patient believes is from a pinched nerve in her neck. Was awoken from the pain in her hand last night. Felt short of breath. Went back to sleep. Woke up this morning, BP increasing, states she was dizzy and felt unusual.

## 2020-08-19 NOTE — Progress Notes (Signed)
Patient ID: Sydney Booth, female    DOB: 1931-08-06  MRN: [id number]  CC: Hypertension Follow-Up   Subjective: Sydney Booth is a 87 y.o. female who presents for hypertension follow-up.   Her concerns today include:  HYPERTENSION FOLLOW-UP: 05/13/2020 per DO note: BP well controlled. Asymptomatic. Continue current regimen.   08/21/2020: Currently taking: see medication list Med Adherence: '[x]'$  Yes    '[]'$  No Medication side effects: '[]'$  Yes    '[x]'$  No Adherence with salt restriction (low-salt diet): '[x]'$  Yes    '[]'$  No Exercise: Yes '[]'$  No '[x]'$  Home Monitoring?: '[x]'$  Yes    '[]'$  No Monitoring Frequency: '[x]'$  Yes    '[]'$  No Home BP results range: '[x]'$  Yes, 110's-120's/60's  Smoking '[]'$  Yes '[x]'$  No SOB? Yes, usually at nighttime and related to being nervous  Chest Pain?: '[]'$  Yes    '[x]'$  No Comments: Requesting refills for 1 year   Patient Active Problem List   Diagnosis Date Noted   History of malignant neoplasm of skin 08/21/2020   Milia 08/21/2020   Neoplasm of uncertain behavior of skin 08/21/2020   Purpura ([place]) 08/21/2020   Skin tag 08/21/2020   Abnormal CXR 04/01/2020   Left carpal tunnel syndrome 06/08/2018   Peripheral neuropathy    Syncope    Trigger finger, left index finger 06/04/2016   Impingement syndrome, shoulder, left 10/31/2015   Balance problem 04/11/2015   Neck pain 04/11/2015   Chronic pain of both shoulders 12/20/2014   Status post total left knee replacement 12/20/2014   Benign paroxysmal positional vertigo 09/12/2014   Neuropathic pain of foot 09/12/2014   Rotator cuff syndrome 06/28/2014   Dysfunction of both eustachian tubes 05/30/2014   Blurred vision 11/24/2013   Vitamin D deficiency 03/08/2013   Obstructive sleep apnea 02/14/2013   Mixed hyperlipidemia 02/09/2013   Arthritis of knee 03/24/2012   Presence of unspecified artificial knee joint 03/24/2012   Dyslipidemia 03/02/2012   GERD (gastroesophageal reflux disease) 03/02/2012   Mitral valve prolapse 03/02/2012    Obesity 01/27/2012   Primary osteoarthritis of right knee 03/24/2011    Class: Chronic   Essential hypertension 03/24/2011    Class: Chronic     Current Outpatient Medications on File Prior to Visit  Medication Sig Dispense Refill   acetaminophen (TYLENOL) 325 MG tablet Take 2 tablets (650 mg total) by mouth every 6 (six) hours as needed for moderate pain. (Patient taking differently: Take 650 mg by mouth every 6 (six) hours as needed for moderate pain. PRN) 30 tablet 0   aspirin 81 MG tablet Take 81 mg by mouth daily.     calcium carbonate (OS-CAL) 600 MG TABS Take 600 mg by mouth daily.     CINNAMON PO Take 500 mg by mouth 2 (two) times daily.     ezetimibe (ZETIA) 10 MG tablet Take 10 mg by mouth daily.     Omega-3 Fatty Acids (FISH OIL) 1000 MG CPDR Take 1,000 mg by mouth daily.     omeprazole (PRILOSEC) 40 MG capsule Take 40 mg by mouth daily.     QUERCETIN PO Take 1,500 mg by mouth daily.     Turmeric 500 MG CAPS Take by mouth daily.     VITAMIN A PO Take 10,000 mg by mouth.     vitamin B-12 (CYANOCOBALAMIN) 1000 MCG tablet Take 1,000 mcg by mouth daily.     VITAMIN D, CHOLECALCIFEROL, PO Take 1 tablet by mouth daily.     Zinc Acetate, Oral, (ZINC  ACETATE PO) Take 1 tablet by mouth.     Zinc Oxide 40 % PSTE Apply 1 application topically as needed. 113 g 0   No current facility-administered medications on file prior to visit.    Allergies  Allergen Reactions   Cymbalta [Duloxetine Hcl]    Gabapentin     dizzy   Statins Other (See Comments)   Sulfa Antibiotics Hives and Other (See Comments)   Zocor [Simvastatin]     Muscle aches    Social History   Socioeconomic History   Marital status: Widowed    Spouse name: Not on file   Number of children: 5   Years of education: 9   Highest education level: Not on file  Occupational History   Occupation: Retired   Tobacco Use   Smoking status: Never   Smokeless tobacco: Never  Vaping Use   Vaping Use: Never used   Substance and Sexual Activity   Alcohol use: No    Alcohol/week: 0.0 standard drinks   Drug use: No   Sexual activity: Not on file  Other Topics Concern   Not on file  Social History Narrative   Patient lives at home with daughter   Patient is retired   Patient is widowed   Patient has a high school education   Patient has 5 children    Social Determinants of Radio broadcast assistant Strain: Not on file  Food Insecurity: Not on file  Transportation Needs: Not on file  Physical Activity: Not on file  Stress: Not on file  Social Connections: Not on file  Intimate Partner Violence: Not on file    Family History  Problem Relation Age of Onset   CVA Mother    Cancer Father        COLON, LIVER AND PANCREAS   Sleep apnea Brother    Pulmonary embolism Brother    Anesthesia problems Neg Hx    Breast cancer Neg Hx     Past Surgical History:  Procedure Laterality Date   BIospy      Biospy      Uterus wall   Carparal Tunnel     CATARACT EXTRACTION     Colonscopy     EYE SURGERY     Right 2011, Left 2008   LIPOMA EXCISION     FROM BACK    THUMB FUSION     TOTAL KNEE ARTHROPLASTY  03/24/2011   Procedure: TOTAL KNEE ARTHROPLASTY;  Surgeon: Hessie Dibble, MD;  Location: [facility];  Service: Orthopedics;  Laterality: Left;  with revision tibial component   TUBAL LIGATION      ROS: Review of Systems Negative except as stated above  PHYSICAL EXAM: BP 140/83 (BP Location: Left Arm, Patient Position: Sitting, Cuff Size: Large)   Pulse 88   Temp 98.3 F (36.8 C)   Resp 19   Ht 5' (1.524 m)   Wt 219 lb (99.3 kg)   SpO2 96%   BMI 42.77 kg/m   Physical Exam HENT:     Head: Normocephalic and atraumatic.  Eyes:     Extraocular Movements: Extraocular movements intact.     Conjunctiva/sclera: Conjunctivae normal.     Pupils: Pupils are equal, round, and reactive to light.  Cardiovascular:     Rate and Rhythm: Normal rate and regular rhythm.     Pulses: Normal  pulses.     Heart sounds: Normal heart sounds.  Pulmonary:     Effort: Pulmonary effort is normal.  Breath sounds: Normal breath sounds.  Musculoskeletal:     Cervical back: Normal range of motion and neck supple.  Neurological:     General: No focal deficit present.     Mental Status: She is alert and oriented to person, place, and time.  Psychiatric:        Mood and Affect: Mood normal.        Behavior: Behavior normal.   ASSESSMENT AND PLAN: 1. Essential hypertension: - Home blood pressures well-controlled.  - Continue Metoprolol as prescribed.  - Counseled on blood pressure goal of less than 140/90, low-sodium, DASH diet, medication compliance, 150 minutes of moderate intensity exercise per week as tolerated. Discussed medication compliance, adverse effects. - Follow-up with primary provider in 6 months or sooner if needed.  - Basic Metabolic Panel - metoprolol tartrate (LOPRESSOR) 25 MG tablet; Take 1 tablet (25 mg total) by mouth 2 (two) times daily.  Dispense: 360 tablet; Refill: 0    Patient was given the opportunity to ask questions.  Patient verbalized understanding of the plan and was able to repeat key elements of the plan. Patient was given clear instructions to go to Emergency Department or return to medical center if symptoms don't improve, worsen, or new problems develop.The patient verbalized understanding.   Orders Placed This Encounter  Procedures   Basic Metabolic Panel    Requested Prescriptions   Signed Prescriptions Disp Refills   metoprolol tartrate (LOPRESSOR) 25 MG tablet 360 tablet 0    Sig: Take 1 tablet (25 mg total) by mouth 2 (two) times daily.    Return in about 6 months (around 02/21/2021) for Follow-Up or next available hypertension .  Camillia Herter, NP

## 2020-08-21 ENCOUNTER — Ambulatory Visit (INDEPENDENT_AMBULATORY_CARE_PROVIDER_SITE_OTHER): Payer: Medicare Other | Admitting: Family

## 2020-08-21 ENCOUNTER — Encounter: Payer: Self-pay | Admitting: Family

## 2020-08-21 ENCOUNTER — Other Ambulatory Visit: Payer: Self-pay

## 2020-08-21 VITALS — BP 140/83 | HR 88 | Temp 98.3°F | Resp 19 | Ht 60.0 in | Wt 219.0 lb

## 2020-08-21 DIAGNOSIS — L720 Epidermal cyst: Secondary | ICD-10-CM | POA: Insufficient documentation

## 2020-08-21 DIAGNOSIS — L821 Other seborrheic keratosis: Secondary | ICD-10-CM | POA: Insufficient documentation

## 2020-08-21 DIAGNOSIS — Z85828 Personal history of other malignant neoplasm of skin: Secondary | ICD-10-CM | POA: Insufficient documentation

## 2020-08-21 DIAGNOSIS — I10 Essential (primary) hypertension: Secondary | ICD-10-CM

## 2020-08-21 DIAGNOSIS — D485 Neoplasm of uncertain behavior of skin: Secondary | ICD-10-CM | POA: Insufficient documentation

## 2020-08-21 DIAGNOSIS — D692 Other nonthrombocytopenic purpura: Secondary | ICD-10-CM | POA: Insufficient documentation

## 2020-08-21 DIAGNOSIS — L918 Other hypertrophic disorders of the skin: Secondary | ICD-10-CM | POA: Insufficient documentation

## 2020-08-21 DIAGNOSIS — Z23 Encounter for immunization: Secondary | ICD-10-CM | POA: Diagnosis not present

## 2020-08-21 MED ORDER — METOPROLOL TARTRATE 25 MG PO TABS: 25.0000 mg | ORAL_TABLET | Freq: Two times a day (BID) | ORAL | 0 refills | Status: DC

## 2020-08-21 NOTE — Progress Notes (Signed)
Pt presents for hypertension follow-up  ?

## 2020-08-21 NOTE — Addendum Note (Signed)
Addended by: Elmon Else on: 08/21/2020 03:25 PM   Modules accepted: Orders

## 2020-08-22 LAB — BASIC METABOLIC PANEL
BUN/Creatinine Ratio: 16 (ref 12–28)
BUN: 12 mg/dL (ref 8–27)
CO2: 23 mmol/L (ref 20–29)
Calcium: 9.4 mg/dL (ref 8.7–10.3)
Chloride: 98 mmol/L (ref 96–106)
Creatinine, Ser: 0.74 mg/dL (ref 0.57–1.00)
Glucose: 102 mg/dL — ABNORMAL HIGH (ref 65–99)
Potassium: 4.4 mmol/L (ref 3.5–5.2)
Sodium: 136 mmol/L (ref 134–144)
eGFR: 79 mL/min/{1.73_m2} (ref >59)

## 2020-08-22 NOTE — Progress Notes (Signed)
Please call patient with update.   Kidney function normal.

## 2020-08-26 ENCOUNTER — Telehealth: Payer: Self-pay | Admitting: Family

## 2020-08-26 NOTE — Telephone Encounter (Signed)
Patient called stating she had a visit with PCP and was told that she was going to get a refill on ezetimibe (ZETIA) 10 MG tablet and omeprazole (PRILOSEC) 40 MG capsule  Patient would like to know if the meds can be called in to   [facility]  ([place]   Please f/u

## 2020-08-28 ENCOUNTER — Other Ambulatory Visit: Payer: Self-pay

## 2020-08-28 DIAGNOSIS — K219 Gastro-esophageal reflux disease without esophagitis: Secondary | ICD-10-CM

## 2020-08-28 DIAGNOSIS — E782 Mixed hyperlipidemia: Secondary | ICD-10-CM

## 2020-08-28 MED ORDER — EZETIMIBE 10 MG PO TABS: 10.0000 mg | ORAL_TABLET | Freq: Every day | ORAL | 1 refills | Status: DC

## 2020-08-28 MED ORDER — OMEPRAZOLE 40 MG PO CPDR: 40.0000 mg | DELAYED_RELEASE_CAPSULE | Freq: Every day | ORAL | 1 refills | Status: DC

## 2020-08-28 NOTE — Telephone Encounter (Signed)
Meds refilled.

## 2020-09-18 ENCOUNTER — Ambulatory Visit (INDEPENDENT_AMBULATORY_CARE_PROVIDER_SITE_OTHER): Payer: Medicare Other

## 2020-09-18 DIAGNOSIS — Z0000 Encounter for general adult medical examination without abnormal findings: Secondary | ICD-10-CM

## 2020-09-18 NOTE — Progress Notes (Signed)
Subjective:   Sydney Booth is a 87 y.o. female who presents for Medicare Annual (Subsequent) preventive examination.  I connected with  Sydney Booth on 09/18/20 by a video enabled telemedicine application and verified that I am speaking with the correct person using two identifiers.   I discussed the limitations of evaluation and management by telemedicine. The patient expressed understanding and agreed to proceed.  Location of patient: Home  Location of provider: Office  Persons participating in visit: Sydney Booth (patient) and Lanier Prude, CMA/RT(R)(BD)  Review of Systems    Defer to PCP       Objective:    There were no vitals filed for this visit. There is no height or weight on file to calculate BMI.  Advanced Directives 02/06/2020 12/23/2017 12/22/2017 11/24/2013 03/25/2011 03/13/2011  Does Patient Have a Medical Advance Directive? No;Yes Yes No Yes Patient has advance directive, copy in chart Patient has advance directive, copy not in chart  Type of Advance Directive - Living will - Healthcare Power of [place];Living will Healthcare Power of [place];Living will  Does patient want to make changes to medical advance directive? - No - Patient declined - - - -  Copy of [place] in Chart? - - - - Copy requested from family Copy requested from family  Pre-existing out of facility DNR order (yellow form or pink MOST form) - - - - No No    Current Medications (verified) Outpatient Encounter Medications as of 09/18/2020  Medication Sig   acetaminophen (TYLENOL) 325 MG tablet Take 2 tablets (650 mg total) by mouth every 6 (six) hours as needed for moderate pain. (Patient taking differently: Take 650 mg by mouth every 6 (six) hours as needed for moderate pain. PRN)   aspirin 81 MG tablet Take 81 mg by mouth daily.   calcium carbonate (OS-CAL) 600 MG TABS Take 600 mg by mouth daily.   CINNAMON PO Take 500 mg by mouth 2 (two) times  daily.   ezetimibe (ZETIA) 10 MG tablet Take 1 tablet (10 mg total) by mouth daily.   metoprolol tartrate (LOPRESSOR) 25 MG tablet Take 1 tablet (25 mg total) by mouth 2 (two) times daily.   Omega-3 Fatty Acids (FISH OIL) 1000 MG CPDR Take 1,000 mg by mouth daily.   omeprazole (PRILOSEC) 40 MG capsule Take 1 capsule (40 mg total) by mouth daily.   QUERCETIN PO Take 1,500 mg by mouth daily.   Turmeric 500 MG CAPS Take by mouth daily.   VITAMIN A PO Take 10,000 mg by mouth.   vitamin B-12 (CYANOCOBALAMIN) 1000 MCG tablet Take 1,000 mcg by mouth daily.   VITAMIN D, CHOLECALCIFEROL, PO Take 1 tablet by mouth daily.   Zinc Acetate, Oral, (ZINC ACETATE PO) Take 1 tablet by mouth.   Zinc Oxide 40 % PSTE Apply 1 application topically as needed.   No facility-administered encounter medications on file as of 09/18/2020.    Allergies (verified) Cymbalta [duloxetine hcl], Gabapentin, Statins, Sulfa antibiotics, and Zocor [simvastatin]   History: Past Medical History:  Diagnosis Date   Arthritis    Blood transfusion    Cervical vertebral fracture (HCC)    fued on its on   Closed head injury    as a chlid   Fall 12/22/2017   right eye bruised   Gastric ulcer    GERD (gastroesophageal reflux disease)    H/O hiatal hernia    Headache(784.0)    Hyperlipemia  Hypertension    Left carpal tunnel syndrome 06/08/2018   Mitral valve prolapse    Obesity    OSA on CPAP    Osteopenia    Rotator cuff tear    Left   Seasonal allergies    Shortness of breath    With activity   Ulcer 1991   [place]    Past Surgical History:  Procedure Laterality Date   BIospy      Biospy      Uterus wall   [place] Tunnel     CATARACT EXTRACTION     Colonscopy     EYE SURGERY     Right 2011, Left 2008   LIPOMA EXCISION     FROM BACK    THUMB FUSION     TOTAL KNEE ARTHROPLASTY  03/24/2011   Procedure: TOTAL KNEE ARTHROPLASTY;  Surgeon: Hessie Dibble, MD;  Location: [facility];  Service: Orthopedics;   Laterality: Left;  with revision tibial component   TUBAL LIGATION     Family History  Problem Relation Age of Onset   CVA Mother    Cancer Father        COLON, LIVER AND PANCREAS   Sleep apnea Brother    Pulmonary embolism Brother    Anesthesia problems Neg Hx    Breast cancer Neg Hx    Social History   Socioeconomic History   Marital status: Widowed    Spouse name: Not on file   Number of children: 5   Years of education: 57   Highest education level: Not on file  Occupational History   Occupation: Retired   Tobacco Use   Smoking status: Never   Smokeless tobacco: Never  Vaping Use   Vaping Use: Never used  Substance and Sexual Activity   Alcohol use: No    Alcohol/week: 0.0 standard drinks   Drug use: No   Sexual activity: Not Currently  Other Topics Concern   Not on file  Social History Narrative   Patient lives at home with daughter   Patient is retired   Patient is widowed   Patient has a high school education   Patient has 5 children    Social Determinants of Radio broadcast assistant Strain: Low Risk    Difficulty of Paying Living Expenses: Not hard at all  Food Insecurity: No Food Insecurity   Worried About Charity fundraiser in the Last Year: Never true   Arboriculturist in the Last Year: Never true  Transportation Needs: No Transportation Needs   Lack of Transportation (Medical): No   Lack of Transportation (Non-Medical): No  Physical Activity: Inactive   Days of Exercise per Week: 0 days   Minutes of Exercise per Session: 0 min  Stress: No Stress Concern Present   Feeling of Stress : Not at all  Social Connections: Moderately Isolated   Frequency of Communication with Friends and Family: More than three times a week   Frequency of Social Gatherings with Friends and Family: More than three times a week   Attends Religious Services: More than 4 times per year   Active Member of [employer] or Organizations: No   Attends Archivist  Meetings: Never   Marital Status: Widowed    Tobacco Counseling Counseling given: Not Answered   Clinical Intake:  Pre-visit preparation completed: Yes  Pain : No/denies pain     Diabetes: No  How often do you need to have someone help you when you  read instructions, pamphlets, or other written materials from your doctor or pharmacy?: 1 - Never  Diabetic? No   Interpreter Needed?: No      Activities of Daily Living In your present state of health, do you have any difficulty performing the following activities: 09/18/2020  Hearing? Y  Comment nerve damage  Vision? Y  Difficulty concentrating or making decisions? N  Walking or climbing stairs? N  Dressing or bathing? N  Doing errands, shopping? N  Comment patient has someone with her all the time  Some recent data might be hidden    Patient Care Team: Camillia Herter, NP as PCP - General (Nurse Practitioner) Belva Crome, MD as PCP - Cardiology (Cardiology)  Indicate any recent Medical Services you may have received from other than [facility] providers in the past year (date may be approximate).     Assessment:   This is a routine wellness examination for Kendal.  Hearing/Vision screen No results found. Patient has annual vision exams.   Dietary issues and exercise activities discussed:  Patient tries to walk when she can at [employer] as the cart helps to support and she also tries to go up and down stairs at home twice a day using handrails.   Goals Addressed   None    Depression Screen PHQ 2/9 Scores 09/18/2020 05/13/2020 02/20/2020 02/06/2020  PHQ - 2 Score 0 0 0 0  PHQ- 9 Score 0 0 0 2    Fall Risk Fall Risk  09/18/2020 05/13/2020 02/06/2020  Falls in the past year? 0 0 0  Number falls in past yr: 0 0 0  Injury with Fall? 0 0 0  Risk for fall due to : No Fall Risks Impaired mobility;History of fall(s) -  Follow up Falls evaluation completed Falls evaluation completed -    FALL RISK PREVENTION PERTAINING TO THE  HOME:  Any stairs in or around the home? Yes  If so, are there any without handrails? Yes  Home free of loose throw rugs in walkways, pet beds, electrical cords, etc? No  Adequate lighting in your home to reduce risk of falls? Yes   ASSISTIVE DEVICES UTILIZED TO PREVENT FALLS:  Life alert? No  Use of a cane, walker or w/c? Yes patient has cane and walker as needed Grab bars in the bathroom? Yes  Shower chair or bench in shower? No  Elevated toilet seat or a handicapped toilet? No   TIMED UP AND GO:   Was the test performed?  N/A .  Length of time to ambulate 10 feet: N/A sec.   These test cannot be performed via telehealth encounter.   Cognitive Function:     6CIT Screen 09/18/2020  What Year? 0 points  What month? 0 points  What time? 0 points  Count back from 20 0 points  Months in reverse 0 points  Repeat phrase 2 points  Total Score 2    Immunizations Immunization History  Administered Date(s) Administered   PFIZER(Purple Top)SARS-COV-2 Vaccination 03/31/2019, 04/21/2019, 12/15/2019, 06/19/2020   Pneumococcal Conjugate-13 11/17/2013   Pneumococcal Polysaccharide-23 01/06/2007, 06/17/2016   Tdap 05/18/2012, 12/22/2017, 06/19/2020   Zoster Recombinat (Shingrix) 08/21/2020   Zoster, Live 05/18/2012    TDAP status: Up to date  Flu Vaccine status: Due, Education has been provided regarding the importance of this vaccine. Advised may receive this vaccine at local pharmacy or Health Dept. Aware to provide a copy of the vaccination record if obtained from local pharmacy or Health Dept. Verbalized acceptance  and understanding.  Pneumococcal vaccine status: Up to date  Covid-19 vaccine status: Completed vaccines  Qualifies for Shingles Vaccine? Yes   Zostavax completed Yes   Shingrix Completed?: Yes  Screening Tests Health Maintenance  Topic Date Due   INFLUENZA VACCINE  Never done   Zoster Vaccines- Shingrix (2 of 2) 10/16/2020   COVID-19 Vaccine (5 - Booster  for Pfizer series) 10/19/2020   TETANUS/TDAP  06/20/2030   DEXA SCAN  Completed   PNA vac Low Risk Adult  Completed   HPV VACCINES  Aged Out    Health Maintenance  Health Maintenance Due  Topic Date Due   INFLUENZA VACCINE  Never done    Colorectal cancer screening: No longer required.   Mammogram status: Completed 05/05/2019. Repeat every year. Patient will call to schedule.   Bone Density status: Completed 07/10/2020. Results reflect: Bone density results: OSTEOPENIA. Repeat every 1 years.  Lung Cancer Screening: (Low Dose CT Chest recommended if Age 57-80 years, 30 pack-year currently smoking OR have quit w/in 15years.) does not qualify.   Lung Cancer Screening Referral: N/A  Additional Screening:  Hepatitis C Screening: does not qualify; Completed N/A  Vision Screening: Recommended annual ophthalmology exams for early detection of glaucoma and other disorders of the eye. Is the patient up to date with their annual eye exam?  Yes  Who is the provider or what is the name of the office in which the patient attends annual eye exams? Patient is not sure of providers name. If pt is not established with a provider, would they like to be referred to a provider to establish care?  N/A .   Dental Screening: Recommended annual dental exams for proper oral hygiene  Community Resource Referral / Chronic Care Management: CRR required this visit?  No   CCM required this visit?  No      Plan:     I have personally reviewed and noted the following in the patient's chart:   Medical and social history Use of alcohol, tobacco or illicit drugs  Current medications and supplements including opioid prescriptions.  Functional ability and status Nutritional status Physical activity Advanced directives List of other physicians Hospitalizations, surgeries, and ER visits in previous 12 months Vitals Screenings to include cognitive, depression, and falls Referrals and appointments  In  addition, I have reviewed and discussed with patient certain preventive protocols, quality metrics, and best practice recommendations. A written personalized care plan for preventive services as well as general preventive health recommendations were provided to patient.     Jerilee Field, RT   09/18/2020   Nurse Notes: 30 min non face to face visit. AVS mailed to patient.

## 2020-11-26 ENCOUNTER — Other Ambulatory Visit: Payer: Self-pay | Admitting: Family Medicine

## 2020-11-26 DIAGNOSIS — M5412 Radiculopathy, cervical region: Secondary | ICD-10-CM

## 2020-12-10 ENCOUNTER — Ambulatory Visit (INDEPENDENT_AMBULATORY_CARE_PROVIDER_SITE_OTHER): Payer: Medicare Other | Admitting: Physician Assistant

## 2020-12-10 ENCOUNTER — Other Ambulatory Visit: Payer: Self-pay

## 2020-12-10 ENCOUNTER — Encounter: Payer: Self-pay | Admitting: Physician Assistant

## 2020-12-10 VITALS — BP 140/70 | HR 90 | Ht 60.0 in | Wt 224.0 lb

## 2020-12-10 DIAGNOSIS — I272 Pulmonary hypertension, unspecified: Secondary | ICD-10-CM

## 2020-12-10 DIAGNOSIS — I700 Atherosclerosis of aorta: Secondary | ICD-10-CM

## 2020-12-10 DIAGNOSIS — I10 Essential (primary) hypertension: Secondary | ICD-10-CM | POA: Diagnosis not present

## 2020-12-10 DIAGNOSIS — R0609 Other forms of dyspnea: Secondary | ICD-10-CM

## 2020-12-10 DIAGNOSIS — I2510 Atherosclerotic heart disease of native coronary artery without angina pectoris: Secondary | ICD-10-CM

## 2020-12-10 DIAGNOSIS — I059 Rheumatic mitral valve disease, unspecified: Secondary | ICD-10-CM

## 2020-12-10 MED ORDER — ISOSORBIDE MONONITRATE ER 30 MG PO TB24: 30.0000 mg | ORAL_TABLET | Freq: Every day | ORAL | 3 refills | Status: DC

## 2020-12-10 NOTE — Progress Notes (Addendum)
Cardiology Office Note    Date:  12/10/2020   ID:  [name], DOB Feb 05, 1931, MRN [id number]  PCP:  Camillia Herter, NP  Cardiologist:  Sinclair Grooms, MD  Electrophysiologist:  None   Chief Complaint: shortness of breath with exertion  History of Present Illness:   Sydney Booth is a 87 y.o. female with history of HTN, HLD, OSA on CPAP, obesity, coronary calcification on CT, neuropathy, mitral stenosis and mitral regurgitation who is seen for follow-up. She is originally from [place] but has lived in [place] and [place].  She was remotely seen by Dr. Virgina Jock in 2019 for episode of fall with right orbital fracture. She had loss of memory about the events so it was unclear if there was any syncope. Echo was felt reassuring and no further cardiac workup was recommended at that time. She then established care with Dr. Tamala Julian who recommended event monitor showing NSR without significant arrhythmias, occasional PACs/PVCs, 1 SVT run 6 beats and intermittent IVCD/BBB. She was seen earlier this year for dyspnea on exertion. There was question of whether she'd had Covid due to loss of taste/smell as well. Repeat echo 03/2020 EF 60-65%, moderate LVH, mildly elevated PASP, mild MR, moderate MS, mild-moderate aortic sclerosis without stenosis. She was trialed on low dose Lasix but did not tolerate this due to headache, nausea, dizziness and decreased appetite. Symptoms were felt to be improving at f/u so no further testing pursued. Of note, had CT chest 04/2020 by pulmonology with report below:  IMPRESSION: 1. Mild patchy air trapping in both lungs, indicative of small airways disease. 2. Mild cylindrical bronchiolectasis in the basilar lower lobes, right greater than left. 3. Scattered thin parenchymal bands and minimal subpleural reticulation at both lung bases, favoring nonspecific postinfectious/postinflammatory scarring. No compelling findings of interstitial lung disease at this time. 4. A few  tiny solid pulmonary nodules, largest 3 mm. No follow-up needed if patient is low-risk (and has no known or suspected primary neoplasm). Non-contrast chest CT can be considered in 12 months if patient is high-risk. This recommendation follows the consensus statement: Guidelines for Management of Incidental Pulmonary Nodules Detected on CT Images: From the Fleischner Society 2017; Radiology 2017; 284:228-243. 5. One vessel coronary atherosclerosis. 6. Small hiatal hernia. 7. Aortic Atherosclerosis (ICD10-I70.0).   She is seen back today for follow-up. She is very upset that she is seeing another assistant of Dr. Thompson Caul as she has requested specifically to follow up with him before and said she was promised this appointment would be with him. I did offer for her to reschedule if she wanted to but she agreed to continue with today's visit. She continues to report dyspnea on exertion similar to the level she had in March 2022. This does interfere with ADLs/quality of life. No orthopnea (uses CPAP). Trace edema noted on exam. No anginal-type chest pain. She also has various orthopedic issues that limit her activity.  Labwork independently reviewed: 08/2020 K 4.4, Cr 0.74 02/2020 TSH wnl, BNP 122, LDL 101, trig 108, alk phos 143, AST/ALT, CBC wnl   Past Medical History:  Diagnosis Date   Arthritis    Blood transfusion    Cervical vertebral fracture (HCC)    fued on its on   Closed head injury    as a chlid   Coronary artery calcification seen on CT scan    Fall 12/22/2017   right eye bruised   Gastric ulcer    GERD (gastroesophageal reflux disease)  H/O hiatal hernia    Headache(784.0)    Hyperlipemia    Hypertension    Left carpal tunnel syndrome 06/08/2018   Mild pulmonary hypertension (HCC)    Mitral regurgitation    Mitral stenosis    Mitral valve prolapse    Obesity    OSA on CPAP    Osteopenia    Rotator cuff tear    Left   Seasonal allergies    Shortness of breath     With activity   Ulcer 1991   [place]     Past Surgical History:  Procedure Laterality Date   BIospy      Biospy      Uterus wall   [place] Tunnel     CATARACT EXTRACTION     Colonscopy     EYE SURGERY     Right 2011, Left 2008   LIPOMA EXCISION     FROM BACK    THUMB FUSION     TOTAL KNEE ARTHROPLASTY  03/24/2011   Procedure: TOTAL KNEE ARTHROPLASTY;  Surgeon: Hessie Dibble, MD;  Location: [facility];  Service: Orthopedics;  Laterality: Left;  with revision tibial component   TUBAL LIGATION      Current Medications: Current Meds  Medication Sig   acetaminophen (TYLENOL) 325 MG tablet Take 2 tablets (650 mg total) by mouth every 6 (six) hours as needed for moderate pain.   aspirin 81 MG tablet Take 81 mg by mouth daily.   calcium carbonate (OS-CAL) 600 MG TABS Take 600 mg by mouth daily.   CINNAMON PO Take 500 mg by mouth 2 (two) times daily.   ezetimibe (ZETIA) 10 MG tablet Take 1 tablet (10 mg total) by mouth daily.   isosorbide mononitrate (IMDUR) 30 MG 24 hr tablet Take 1 tablet (30 mg total) by mouth daily.   metoprolol tartrate (LOPRESSOR) 25 MG tablet Take 1 tablet (25 mg total) by mouth 2 (two) times daily.   Omega-3 Fatty Acids (FISH OIL) 1000 MG CPDR Take 1,000 mg by mouth daily.   omeprazole (PRILOSEC) 40 MG capsule Take 1 capsule (40 mg total) by mouth daily.   QUERCETIN PO Take 1,500 mg by mouth daily.   Turmeric 500 MG CAPS Take by mouth daily.   VITAMIN A PO Take 10,000 mg by mouth.   vitamin B-12 (CYANOCOBALAMIN) 1000 MCG tablet Take 1,000 mcg by mouth daily.   VITAMIN D, CHOLECALCIFEROL, PO Take 1 tablet by mouth daily.   Zinc Acetate, Oral, (ZINC ACETATE PO) Take 1 tablet by mouth.   Zinc Oxide 40 % PSTE Apply 1 application topically as needed.     Allergies:   Cymbalta [duloxetine hcl], Gabapentin, Statins, Sulfa antibiotics, and Zocor [simvastatin]   Social History   Socioeconomic History   Marital status: Widowed    Spouse name: Not on file    Number of children: 5   Years of education: 37   Highest education level: Not on file  Occupational History   Occupation: Retired   Tobacco Use   Smoking status: Never   Smokeless tobacco: Never  Vaping Use   Vaping Use: Never used  Substance and Sexual Activity   Alcohol use: No    Alcohol/week: 0.0 standard drinks   Drug use: No   Sexual activity: Not Currently  Other Topics Concern   Not on file  Social History Narrative   Patient lives at home with daughter   Patient is retired   Patient is widowed   Patient has a  high school education   Patient has 5 children    Social Determinants of Health   Financial Resource Strain: Low Risk    Difficulty of Paying Living Expenses: Not hard at all  Food Insecurity: No Food Insecurity   Worried About Charity fundraiser in the Last Year: Never true   [place] in the Last Year: Never true  Transportation Needs: No Transportation Needs   Lack of Transportation (Medical): No   Lack of Transportation (Non-Medical): No  Physical Activity: Inactive   Days of Exercise per Week: 0 days   Minutes of Exercise per Session: 0 min  Stress: No Stress Concern Present   Feeling of Stress : Not at all  Social Connections: Moderately Isolated   Frequency of Communication with Friends and Family: More than three times a week   Frequency of Social Gatherings with Friends and Family: More than three times a week   Attends Religious Services: More than 4 times per year   Active Member of [employer] or Organizations: No   Attends Archivist Meetings: Never   Marital Status: Widowed     Family History:  The patient's family history includes CVA in her mother; Cancer in her father; Pulmonary embolism in her brother; Sleep apnea in her brother. There is no history of Anesthesia problems or Breast cancer.  ROS:   Please see the history of present illness.  All other systems are reviewed and otherwise negative.    EKGs/Labs/Other  Studies Reviewed:    Studies reviewed are outlined and summarized above. Reports included below if pertinent.  2D echo 03/2020  1. Left ventricular ejection fraction, by estimation, is 60 to 65%. The  left ventricle has normal function. The left ventricle has no regional  wall motion abnormalities. There is moderate left ventricular hypertrophy.  Left ventricular diastolic  parameters are indeterminate.   2. Right ventricular systolic function is normal. The right ventricular  size is normal. There is mildly elevated pulmonary artery systolic  pressure. The estimated right ventricular systolic pressure is 47.4 mmHg.   3. The mitral valve is abnormal. Mild mitral valve regurgitation.  Moderate mitral stenosis. MG 7 mmHg at HR 92 bpm. Severe mitral annular  calcification.   4. The aortic valve was not well visualized. Aortic valve regurgitation  is not visualized. Mild to moderate aortic valve sclerosis/calcification  is present, without any evidence of aortic stenosis.   5. The inferior vena cava is normal in size with greater than 50%  respiratory variability, suggesting right atrial pressure of 3 mmHg.   Comparison(s): 12/23/17 EF 65-70%. PA pressure 79mHg.   Monitor 02/2018 Normal sinus rhythm No atrial fibrillation or significant arrhythmias. Occasional PACs and PVCs. No correlation between arrhythmia and symptoms. 1 brief run of SVT less than 6 beats. Intermittent interventricular conduction delay/bundle branch block.   No sustained arrhythmias.  One episode of nonsustained SVT less than 8 beats.  Normal carotid 01/2014    EKG:  EKG is not ordered today but reviewed from NSR 79bpm, LBBB, known from prior   Recent Labs: 02/06/2020: ALT 13; BNP 122.8; Hemoglobin 13.8; Platelets 291; TSH 3.090 08/21/2020: BUN 12; Creatinine, Ser 0.74; Potassium 4.4; Sodium 136  Recent Lipid Panel    Component Value Date/Time   CHOL 187 02/06/2020 1502   TRIG 108 02/06/2020 1502   HDL 67  02/06/2020 1502   CHOLHDL 2.8 02/06/2020 1502   LDLCALC 101 (H) 02/06/2020 1502    PHYSICAL EXAM:  VS:  BP 140/70 (BP Location: Left Arm, Patient Position: Sitting, Cuff Size: Normal)   Pulse 90   Ht 5' (1.524 m)   Wt 224 lb (101.6 kg)   SpO2 97%   BMI 43.75 kg/m   BMI: Body mass index is 43.75 kg/m.  GEN: Well nourished, well developed female in no acute distress HEENT: normocephalic, atraumatic Neck: no JVD, carotid bruits, or masses Cardiac: RRR; soft SEM LSB, no rubs or gallops, trace BLE edema  Respiratory:  clear to auscultation bilaterally, normal work of breathing GI: soft, nontender, nondistended, + BS MS: no deformity or atrophy Skin: warm and dry, no rash Neuro:  Alert and Oriented x 3, Strength and sensation are intact, follows commands Psych: euthymic mood, full affect  Wt Readings from Last 3 Encounters:  12/10/20 224 lb (101.6 kg)  08/21/20 219 lb (99.3 kg)  05/15/20 221 lb (100.2 kg)     ASSESSMENT & PLAN:   1. Dyspnea on exertion- etiology not entirely clear, may be multifactorial in the setting of deconditioning, LVH, mild pulm HTN, valvular heart disease and obesity. However, she has had coronary calcification on CT before. She has a known LBBB which makes interpretation of EKGs challenging. We will undertake a Lexiscan nuclear stress test (she is not able to walk on treadmill). She is familiar with this as she had it done somewhere else a few years ago. We will also update her echocardiogram to ensure no new changes compared to prior, along with labs today.  Shared Decision Making/Informed Consent The risks [chest pain, shortness of breath, cardiac arrhythmias, dizziness, blood pressure fluctuations, myocardial infarction, stroke/transient ischemic attack, nausea, vomiting, allergic reaction, radiation exposure, metallic taste sensation and life-threatening complications (estimated to be 1 in 10,000)], benefits (risk stratification, diagnosing coronary  artery disease, treatment guidance) and alternatives of a nuclear stress test were discussed in detail with Ms. Weick and she agrees to proceed.  2. Essential HTN - BP elevated today but she reports this is highly unusual, as her BPs at home are consistently well controlled.  3. Coronary calcification seen on CT, aortic atherosclerosis - prior LDL not totally at goal. Continue Zetia. She has been intolerant of statins per notes. We can revisit additional therapy upon follow-up contingent on testing results.  4. Mild MR, moderate mitral stenosis, and mildly elevated PASP - recheck echocardiogram. She is compliant with CPAP. She does not think anyone specifically is monitoring her for this at this time. She does recall Dr. Delfina Redwood, her prior PCP, getting routine downloads from her machine but she no longer follows with him. I offered referral to our sleep medicine team but she would like to discuss further follow-up with PCP.    Disposition: F/u with Dr. Tamala Julian after testing. Note patient requests Dr. Tamala Julian, not APP.  It does appear a prescription for Isosorbide was sent in on this patient per MA intended for another patient. I have called [facility] to tell them to cancel this prescription. They acknowledged this request. I also called the patient to tell her as well and she verbalized understanding.   Medication Adjustments/Labs and Tests Ordered: Current medicines are reviewed at length with the patient today.  Concerns regarding medicines are outlined above. Medication changes, Labs and Tests ordered today are summarized above and listed in the Patient Instructions accessible in Encounters.   Signed, Charlie Pitter, PA-C  12/10/2020 4:08 PM    [facility] Phone: [phone]; Fax: [phone]

## 2020-12-10 NOTE — Patient Instructions (Addendum)
Medication Instructions:  Your physician recommends that you continue on your current medications as directed. Please refer to the Current Medication list given to you today.  *If you need a refill on your cardiac medications before your next appointment, please call your pharmacy*   Lab Work: TODAY:  BMET, CBC, & PRO BNP  If you have labs (blood work) drawn today and your tests are completely normal, you will receive your results only by: [place] (if you have MyChart) OR A paper copy in the mail If you have any lab test that is abnormal or we need to change your treatment, we will call you to review the results.   Testing/Procedures: Your physician has requested that you have an echocardiogram. Echocardiography is a painless test that uses sound waves to create images of your heart. It provides your doctor with information about the size and shape of your heart and how well your heart's chambers and valves are working. This procedure takes approximately one hour. There are no restrictions for this procedure.  Your physician has requested that you have a lexiscan myoview. For further information please visit [email or web address]. Please follow instruction sheet, BELOW:    You are scheduled for a Myocardial Perfusion Imaging Study  Please arrive 15 minutes prior to your appointment time for registration and insurance purposes.  The test will take approximately 3 to 4 hours to complete; you may bring reading material.  If someone comes with you to your appointment, they will need to remain in the main lobby due to limited space in the testing area. **If you are pregnant or breastfeeding, please notify the nuclear lab prior to your appointment**  How to prepare for your Myocardial Perfusion Test: Do not eat or drink 3 hours prior to your test, except you may have water. Do not consume products containing caffeine (regular or decaffeinated) 12 hours prior to your test. (ex: coffee,  chocolate, sodas, tea). Do bring a list of your current medications with you.  If not listed below, you may take your medications as normal. Do wear comfortable clothes (no dresses or overalls) and walking shoes, tennis shoes preferred (No heels or open toe shoes are allowed). Do NOT wear cologne, perfume, aftershave, or lotions (deodorant is allowed). If these instructions are not followed, your test will have to be rescheduled.      Follow-Up: At [facility], you and your health needs are our priority.  As part of our continuing mission to provide you with exceptional heart care, we have created designated Provider Care Teams.  These Care Teams include your primary Cardiologist (physician) and Advanced Practice Providers (APPs -  Physician Assistants and Nurse Practitioners) who all work together to provide you with the care you need, when you need it.  We recommend signing up for the patient portal called "MyChart".  Sign up information is provided on this After Visit Summary.  MyChart is used to connect with patients for Virtual Visits (Telemedicine).  Patients are able to view lab/test results, encounter notes, upcoming appointments, etc.  Non-urgent messages can be sent to your provider as well.   To learn more about what you can do with MyChart, go to [email or web address].    Your next appointment:   AFTER TESTING   The format for your next appointment:   In Person  Provider:   Sinclair Grooms, MD ONLY PER DAYNA DUNN      Other Instructions

## 2020-12-11 LAB — BASIC METABOLIC PANEL
BUN/Creatinine Ratio: 15 (ref 12–28)
BUN: 11 mg/dL (ref 8–27)
CO2: 24 mmol/L (ref 20–29)
Calcium: 9.4 mg/dL (ref 8.7–10.3)
Chloride: 96 mmol/L (ref 96–106)
Creatinine, Ser: 0.72 mg/dL (ref 0.57–1.00)
Glucose: 108 mg/dL — ABNORMAL HIGH (ref 70–99)
Potassium: 4.2 mmol/L (ref 3.5–5.2)
Sodium: 132 mmol/L — ABNORMAL LOW (ref 134–144)
eGFR: 81 mL/min/{1.73_m2} (ref >59)

## 2020-12-11 LAB — CBC
Hematocrit: 39.1 % (ref 34.0–46.6)
Hemoglobin: 13.2 g/dL (ref 11.1–15.9)
MCH: 29.3 pg (ref 26.6–33.0)
MCHC: 33.8 g/dL (ref 31.5–35.7)
MCV: 87 fL (ref 79–97)
Platelets: 327 10*3/uL (ref 150–450)
RBC: 4.5 x10E6/uL (ref 3.77–5.28)
RDW: 13.1 % (ref 11.7–15.4)
WBC: 5.9 10*3/uL (ref 3.4–10.8)

## 2020-12-11 LAB — PRO B NATRIURETIC PEPTIDE: NT-Pro BNP: 730 pg/mL (ref 0–738)

## 2020-12-13 NOTE — Progress Notes (Signed)
Patient ID: Sydney Booth, female    DOB: July 19, 1931  MRN: [id number]  CC: Diabetes Screening   Subjective: Sydney Booth is a 87 y.o. female who presents for diabetes screening.   Her concerns today include:   Patient reports would like diabetes screening. Reports in the past when she asked previous primary provider for diabetes screening she was only given a blood sugar test and told everything was normal. Today would like official diabetes screening. Also, recent labs with Cardiology resulted with a lower than normal sodium. Requesting medication refills of Omeprazole.    Patient Active Problem List   Diagnosis Date Noted   History of adenomatous polyp of colon 12/16/2020   Anxiety 12/16/2020   Decreased estrogen level 12/16/2020   Family history of colon cancer 12/16/2020   Major depression 12/16/2020   Moderate major depression, single episode ([place]) 12/16/2020   Nightmares 12/16/2020   Pure hypercholesterolemia 12/16/2020   History of malignant neoplasm of skin 08/21/2020   [place] 08/21/2020   Neoplasm of uncertain behavior of skin 08/21/2020   Purpura ([place]) 08/21/2020   Skin tag 08/21/2020   Abnormal CXR 04/01/2020   Carpal tunnel syndrome of left wrist 06/08/2018   Polyneuropathy    Syncope    Trigger finger, left index finger 06/04/2016   Impingement syndrome, shoulder, left 10/31/2015   Balance problem 04/11/2015   Neck pain 04/11/2015   Chronic pain of both shoulders 12/20/2014   Status post total left knee replacement 12/20/2014   Benign paroxysmal positional vertigo 09/12/2014   Neuropathic pain of foot 09/12/2014   Rotator cuff syndrome 06/28/2014   Dysfunction of both eustachian tubes 05/30/2014   Blurred vision 11/24/2013   Vitamin D deficiency 03/08/2013   Obstructive sleep apnea syndrome 02/14/2013   Hyperlipidemia 02/09/2013   Arthritis of knee 03/24/2012   Presence of unspecified artificial knee joint 03/24/2012   Dyslipidemia 03/02/2012    Gastroesophageal reflux disease without esophagitis 03/02/2012   Mitral valve prolapse 03/02/2012   Morbid obesity ([place]) 01/27/2012   Primary osteoarthritis of right knee 03/24/2011    Class: Chronic   Hypertension 03/24/2011    Class: Chronic     Current Outpatient Medications on File Prior to Visit  Medication Sig Dispense Refill   acetaminophen (TYLENOL) 325 MG tablet Take 2 tablets (650 mg total) by mouth every 6 (six) hours as needed for moderate pain. 30 tablet 0   aspirin 81 MG tablet Take 81 mg by mouth daily.     calcium carbonate (OS-CAL) 600 MG TABS Take 600 mg by mouth daily.     CINNAMON PO Take 500 mg by mouth 2 (two) times daily.     ezetimibe (ZETIA) 10 MG tablet Take 1 tablet (10 mg total) by mouth daily. 90 tablet 1   metoprolol tartrate (LOPRESSOR) 25 MG tablet Take 1 tablet (25 mg total) by mouth 2 (two) times daily. 360 tablet 0   Omega-3 Fatty Acids (FISH OIL) 1000 MG CPDR Take 1,000 mg by mouth daily.     omeprazole (PRILOSEC) 40 MG capsule Take 1 capsule (40 mg total) by mouth daily. 90 capsule 1   QUERCETIN PO Take 1,500 mg by mouth daily.     Turmeric 500 MG CAPS Take by mouth daily.     VITAMIN A PO Take 10,000 mg by mouth.     vitamin B-12 (CYANOCOBALAMIN) 1000 MCG tablet Take 1,000 mcg by mouth daily.     VITAMIN D, CHOLECALCIFEROL, PO Take 1 tablet by mouth daily.  Zinc Acetate, Oral, (ZINC ACETATE PO) Take 1 tablet by mouth.     Zinc Oxide 40 % PSTE Apply 1 application topically as needed. 113 g 0   No current facility-administered medications on file prior to visit.    Allergies  Allergen Reactions   Cymbalta [Duloxetine Hcl]    Gabapentin     dizzy   Statins Other (See Comments)   Sulfa Antibiotics Hives and Other (See Comments)   Zocor [Simvastatin]     Muscle aches    Social History   Socioeconomic History   Marital status: Widowed    Spouse name: Not on file   Number of children: 5   Years of education: 37   Highest education  level: Not on file  Occupational History   Occupation: Retired   Tobacco Use   Smoking status: Never   Smokeless tobacco: Never  Vaping Use   Vaping Use: Never used  Substance and Sexual Activity   Alcohol use: No    Alcohol/week: 0.0 standard drinks   Drug use: No   Sexual activity: Not Currently  Other Topics Concern   Not on file  Social History Narrative   Patient lives at home with daughter   Patient is retired   Patient is widowed   Patient has a high school education   Patient has 5 children    Social Determinants of Radio broadcast assistant Strain: Low Risk    Difficulty of Paying Living Expenses: Not hard at all  Food Insecurity: No Food Insecurity   Worried About Charity fundraiser in the Last Year: Never true   Arboriculturist in the Last Year: Never true  Transportation Needs: No Transportation Needs   Lack of Transportation (Medical): No   Lack of Transportation (Non-Medical): No  Physical Activity: Inactive   Days of Exercise per Week: 0 days   Minutes of Exercise per Session: 0 min  Stress: No Stress Concern Present   Feeling of Stress : Not at all  Social Connections: Moderately Isolated   Frequency of Communication with Friends and Family: More than three times a week   Frequency of Social Gatherings with Friends and Family: More than three times a week   Attends Religious Services: More than 4 times per year   Active Member of [employer] or Organizations: No   Attends Archivist Meetings: Never   Marital Status: Widowed  Human resources officer Violence: Not At Risk   Fear of Current or Ex-Partner: No   Emotionally Abused: No   Physically Abused: No   Sexually Abused: No    Family History  Problem Relation Age of Onset   CVA Mother    Cancer Father        COLON, LIVER AND PANCREAS   Sleep apnea Brother    Pulmonary embolism Brother    Anesthesia problems Neg Hx    Breast cancer Neg Hx     Past Surgical History:  Procedure Laterality  Date   BIospy      Biospy      Uterus wall   Carparal Tunnel     CATARACT EXTRACTION     Colonscopy     EYE SURGERY     Right 2011, Left 2008   LIPOMA EXCISION     FROM BACK    THUMB FUSION     TOTAL KNEE ARTHROPLASTY  03/24/2011   Procedure: TOTAL KNEE ARTHROPLASTY;  Surgeon: Hessie Dibble, MD;  Location: [facility];  Service: Orthopedics;  Laterality: Left;  with revision tibial component   TUBAL LIGATION      ROS: Review of Systems Negative except as stated above  PHYSICAL EXAM: BP (!) 143/72 (BP Location: Right Arm, Patient Position: Sitting, Cuff Size: Large)   Pulse 70   Temp 98.3 F (36.8 C)   Resp 18   Ht 5' (1.524 m)   Wt 220 lb (99.8 kg)   SpO2 97%   BMI 42.97 kg/m   Physical Exam HENT:     Head: Normocephalic and atraumatic.  Eyes:     Extraocular Movements: Extraocular movements intact.     Conjunctiva/sclera: Conjunctivae normal.     Pupils: Pupils are equal, round, and reactive to light.  Cardiovascular:     Rate and Rhythm: Normal rate and regular rhythm.     Pulses: Normal pulses.     Heart sounds: Normal heart sounds.  Pulmonary:     Effort: Pulmonary effort is normal.     Breath sounds: Normal breath sounds.  Musculoskeletal:     Cervical back: Normal range of motion and neck supple.  Neurological:     General: No focal deficit present.     Mental Status: She is alert and oriented to person, place, and time.  Psychiatric:        Mood and Affect: Mood normal.        Behavior: Behavior normal.   Results for orders placed or performed in visit on 12/16/20  POCT glycosylated hemoglobin (Hb A1C)  Result Value Ref Range   Hemoglobin A1C 5.6 4.0 - 5.6 %   HbA1c POC (<> result, manual entry)     HbA1c, POC (prediabetic range)     HbA1c, POC (controlled diabetic range)      ASSESSMENT AND PLAN: 1. Screening for diabetes mellitus (DM): - Hemoglobin A1c today normal.  - Patient signed financial waiver form to complete today's diabetes screening.   - Follow-up with primary provider as scheduled. - POCT glycosylated hemoglobin (Hb A1C)  2. Hyponatremia: - Sodium slightly low at 132 on 12/10/2020. - Patient intends to return for lab only appointment in 2 weeks to have recollected.  - Sodium  3. Gastroesophageal reflux disease without esophagitis: - Continue Omeprazole as prescribed.  - Follow-up with primary provider as scheduled.  - omeprazole (PRILOSEC) 40 MG capsule; Take 1 capsule (40 mg total) by mouth daily.  Dispense: 90 capsule; Refill: 1   Patient was given the opportunity to ask questions.  Patient verbalized understanding of the plan and was able to repeat key elements of the plan. Patient was given clear instructions to go to Emergency Department or return to medical center if symptoms don't improve, worsen, or new problems develop.The patient verbalized understanding.   Orders Placed This Encounter  Procedures   Sodium   POCT glycosylated hemoglobin (Hb A1C)   Meds ordered this encounter  Medications   omeprazole (PRILOSEC) 40 MG capsule    Sig: Take 1 capsule (40 mg total) by mouth daily.    Dispense:  90 capsule    Refill:  1    Order Specific Question:   Supervising Provider    Answer:   Dorna Mai [[id number]]    Follow-up with primary provider as scheduled.  Camillia Herter, NP

## 2020-12-16 ENCOUNTER — Ambulatory Visit (INDEPENDENT_AMBULATORY_CARE_PROVIDER_SITE_OTHER): Payer: Medicare Other | Admitting: Family

## 2020-12-16 ENCOUNTER — Other Ambulatory Visit: Payer: Self-pay | Admitting: Family

## 2020-12-16 ENCOUNTER — Other Ambulatory Visit: Payer: Self-pay

## 2020-12-16 VITALS — BP 143/72 | HR 70 | Temp 98.3°F | Resp 18 | Ht 60.0 in | Wt 220.0 lb

## 2020-12-16 DIAGNOSIS — Z131 Encounter for screening for diabetes mellitus: Secondary | ICD-10-CM | POA: Diagnosis not present

## 2020-12-16 DIAGNOSIS — F321 Major depressive disorder, single episode, moderate: Secondary | ICD-10-CM | POA: Insufficient documentation

## 2020-12-16 DIAGNOSIS — D649 Anemia, unspecified: Secondary | ICD-10-CM

## 2020-12-16 DIAGNOSIS — Z86010 Personal history of colonic polyps: Secondary | ICD-10-CM | POA: Insufficient documentation

## 2020-12-16 DIAGNOSIS — E7800 Pure hypercholesterolemia, unspecified: Secondary | ICD-10-CM | POA: Insufficient documentation

## 2020-12-16 DIAGNOSIS — F329 Major depressive disorder, single episode, unspecified: Secondary | ICD-10-CM | POA: Insufficient documentation

## 2020-12-16 DIAGNOSIS — Z800 Family history of malignant neoplasm of digestive organs: Secondary | ICD-10-CM | POA: Insufficient documentation

## 2020-12-16 DIAGNOSIS — E2839 Other primary ovarian failure: Secondary | ICD-10-CM | POA: Insufficient documentation

## 2020-12-16 DIAGNOSIS — E871 Hypo-osmolality and hyponatremia: Secondary | ICD-10-CM | POA: Diagnosis not present

## 2020-12-16 DIAGNOSIS — F515 Nightmare disorder: Secondary | ICD-10-CM | POA: Insufficient documentation

## 2020-12-16 DIAGNOSIS — K219 Gastro-esophageal reflux disease without esophagitis: Secondary | ICD-10-CM | POA: Diagnosis not present

## 2020-12-16 DIAGNOSIS — F419 Anxiety disorder, unspecified: Secondary | ICD-10-CM | POA: Insufficient documentation

## 2020-12-16 LAB — POCT GLYCOSYLATED HEMOGLOBIN (HGB A1C): Hemoglobin A1C: 5.6 % (ref 4.0–5.6)

## 2020-12-16 MED ORDER — OMEPRAZOLE 40 MG PO CPDR: 40.0000 mg | DELAYED_RELEASE_CAPSULE | Freq: Every day | ORAL | 1 refills | Status: DC

## 2020-12-16 NOTE — Progress Notes (Signed)
Pt presents for follow-up from cardiology had abnormal sodium reading on 12/10/20, pt desires A1c check

## 2020-12-16 NOTE — Progress Notes (Signed)
Diabetes screening discussed in office.

## 2020-12-19 ENCOUNTER — Ambulatory Visit
Admission: RE | Admit: 2020-12-19 | Discharge: 2020-12-19 | Disposition: A | Discharge status: Home / Self Care | Payer: Medicare Other | Source: Ambulatory Visit | Attending: Family Medicine | Admitting: Family Medicine

## 2020-12-19 ENCOUNTER — Other Ambulatory Visit: Payer: Self-pay

## 2020-12-19 DIAGNOSIS — M5412 Radiculopathy, cervical region: Secondary | ICD-10-CM

## 2020-12-20 ENCOUNTER — Telehealth (HOSPITAL_COMMUNITY): Payer: Self-pay | Admitting: *Deleted

## 2020-12-20 ENCOUNTER — Encounter (HOSPITAL_COMMUNITY): Payer: Self-pay | Admitting: *Deleted

## 2020-12-20 NOTE — Telephone Encounter (Signed)
Letter sent outlining instructions for stress test on 12/27/2020 @ 7:15.

## 2020-12-27 ENCOUNTER — Ambulatory Visit (HOSPITAL_BASED_OUTPATIENT_CLINIC_OR_DEPARTMENT_OTHER): Payer: Medicare Other

## 2020-12-27 ENCOUNTER — Other Ambulatory Visit: Payer: Self-pay

## 2020-12-27 ENCOUNTER — Ambulatory Visit (HOSPITAL_COMMUNITY): Payer: Medicare Other | Attending: Cardiology

## 2020-12-27 DIAGNOSIS — I10 Essential (primary) hypertension: Secondary | ICD-10-CM | POA: Diagnosis present

## 2020-12-27 DIAGNOSIS — I2510 Atherosclerotic heart disease of native coronary artery without angina pectoris: Secondary | ICD-10-CM

## 2020-12-27 DIAGNOSIS — I272 Pulmonary hypertension, unspecified: Secondary | ICD-10-CM | POA: Insufficient documentation

## 2020-12-27 DIAGNOSIS — R0609 Other forms of dyspnea: Secondary | ICD-10-CM | POA: Insufficient documentation

## 2020-12-27 DIAGNOSIS — I700 Atherosclerosis of aorta: Secondary | ICD-10-CM | POA: Diagnosis present

## 2020-12-27 DIAGNOSIS — I059 Rheumatic mitral valve disease, unspecified: Secondary | ICD-10-CM

## 2020-12-27 LAB — MYOCARDIAL PERFUSION IMAGING
LV dias vol: 70 mL (ref 46–106)
LV sys vol: 26 mL
Nuc Stress EF: 63 %
Peak HR: 97 {beats}/min
Rest HR: 62 {beats}/min
Rest Nuclear Isotope Dose: 10.2 mCi
SDS: 2
SRS: 0
SSS: 2
ST Depression (mm): 0 mm
Stress Nuclear Isotope Dose: 31.7 mCi
TID: 0.88

## 2020-12-27 LAB — ECHOCARDIOGRAM COMPLETE
AR max vel: 2.11 cm2
AV Area VTI: 2.07 cm2
AV Area mean vel: 1.94 cm2
AV Mean grad: 7.5 mmHg
AV Peak grad: 14.1 mmHg
Ao pk vel: 1.88 m/s
Area-P 1/2: 3.39 cm2
Height: 60 in
MV M vel: 5.86 m/s
MV Peak grad: 137.4 mmHg
MV VTI: 1.9 cm2
S' Lateral: 2.6 cm
Weight: 3584 oz

## 2020-12-27 MED ORDER — TECHNETIUM TC 99M TETROFOSMIN IV KIT
10.2000 | PACK | Freq: Once | INTRAVENOUS | Status: AC | PRN
Start: 2020-12-27 — End: 2020-12-27
  Administered 2020-12-27: 07:00:00 10.2 via INTRAVENOUS
  Filled 2020-12-27: qty 11

## 2020-12-27 MED ORDER — REGADENOSON 0.4 MG/5ML IV SOLN
0.4000 mg | Freq: Once | INTRAVENOUS | Status: AC
  Administered 2020-12-27: 09:00:00 0.4 mg via INTRAVENOUS

## 2020-12-27 MED ORDER — TECHNETIUM TC 99M TETROFOSMIN IV KIT
31.7000 | PACK | Freq: Once | INTRAVENOUS | Status: AC | PRN
Start: 2020-12-27 — End: 2020-12-27
  Administered 2020-12-27: 09:00:00 31.7 via INTRAVENOUS
  Filled 2020-12-27: qty 32

## 2021-01-06 NOTE — Progress Notes (Signed)
Cardiology Office Note:    Date:  01/08/2021   ID:  [name], DOB [birth year], MRN [id number]  PCP:  Camillia Herter, NP  Cardiologist:  Sinclair Grooms, MD   Referring MD: Camillia Herter, NP   No chief complaint on file.   History of Present Illness:    Sydney Booth is a 88 y.o. female with a hx of  HTN, HLD, OSA on CPAP, obesity, coronary calcification on CT, neuropathy, mitral stenosis and mitral regurgitation who is seen for follow-up. She is originally from [place] but has lived in [place] and [place].   Dyspnea on exertion but no significant difficulty at rest.  A multitude of tests were done recently by Melina Copa that are reviewed below.  She does have degenerative mitral stenosis but is not having orthopnea or PND.  She is on beta-blocker therapy.  She also has an intermediate risk nuclear study and at her age and with comorbidities, I do not believe we need to chase coronary disease in absence of symptoms.  She is not excited about doing anything differently.  Past Medical History:  Diagnosis Date   Arthritis    Blood transfusion    Cervical vertebral fracture ([facility])    fued on its on   Closed head injury    as a chlid   Coronary artery calcification seen on CT scan    Fall 12/22/2017   right eye bruised   Gastric ulcer    GERD (gastroesophageal reflux disease)    H/O hiatal hernia    Headache(784.0)    Hyperlipemia    Hypertension    Left carpal tunnel syndrome 06/08/2018   Mild pulmonary hypertension ([facility])    Mitral regurgitation    Mitral stenosis    Mitral valve prolapse    Obesity    OSA on CPAP    Osteopenia    Rotator cuff tear    Left   Seasonal allergies    Shortness of breath    With activity   Ulcer 1991   [place]     Past Surgical History:  Procedure Laterality Date   BIospy      Biospy      Uterus wall   [place] Tunnel     CATARACT EXTRACTION     Colonscopy     EYE SURGERY     Right 2011, Left 2008   LIPOMA EXCISION     FROM BACK     THUMB FUSION     TOTAL KNEE ARTHROPLASTY  03/24/2011   Procedure: TOTAL KNEE ARTHROPLASTY;  Surgeon: Hessie Dibble, MD;  Location: [facility];  Service: Orthopedics;  Laterality: Left;  with revision tibial component   TUBAL LIGATION      Current Medications: Current Meds  Medication Sig   acetaminophen (TYLENOL) 325 MG tablet Take 2 tablets (650 mg total) by mouth every 6 (six) hours as needed for moderate pain.   aspirin 81 MG tablet Take 81 mg by mouth daily.   calcium carbonate (OS-CAL) 600 MG TABS Take 600 mg by mouth daily.   CINNAMON PO Take 500 mg by mouth 2 (two) times daily.   Omega-3 Fatty Acids (FISH OIL) 1000 MG CPDR Take 1,000 mg by mouth daily.   omeprazole (PRILOSEC) 40 MG capsule Take 1 capsule (40 mg total) by mouth daily.   QUERCETIN PO Take 1,500 mg by mouth daily.   Turmeric 500 MG CAPS Take by mouth daily.   VITAMIN A PO Take 10,000  mg by mouth.   vitamin B-12 (CYANOCOBALAMIN) 1000 MCG tablet Take 1,000 mcg by mouth daily.   VITAMIN D, CHOLECALCIFEROL, PO Take 1 tablet by mouth daily.   Zinc Acetate, Oral, (ZINC ACETATE PO) Take 1 tablet by mouth.   [DISCONTINUED] ezetimibe (ZETIA) 10 MG tablet Take 1 tablet (10 mg total) by mouth daily.   [DISCONTINUED] metoprolol tartrate (LOPRESSOR) 25 MG tablet Take 1 tablet (25 mg total) by mouth 2 (two) times daily.     Allergies:   Cymbalta [duloxetine hcl], Gabapentin, Statins, Sulfa antibiotics, and Zocor [simvastatin]   Social History   Socioeconomic History   Marital status: Widowed    Spouse name: Not on file   Number of children: 5   Years of education: 41   Highest education level: Not on file  Occupational History   Occupation: Retired   Tobacco Use   Smoking status: Never   Smokeless tobacco: Never  Vaping Use   Vaping Use: Never used  Substance and Sexual Activity   Alcohol use: No    Alcohol/week: 0.0 standard drinks   Drug use: No   Sexual activity: Not Currently  Other Topics Concern   Not on  file  Social History Narrative   Patient lives at home with daughter   Patient is retired   Patient is widowed   Patient has a high school education   Patient has 5 children    Social Determinants of Radio broadcast assistant Strain: Low Risk    Difficulty of Paying Living Expenses: Not hard at all  Food Insecurity: No Food Insecurity   Worried About Charity fundraiser in the Last Year: Never true   Arboriculturist in the Last Year: Never true  Transportation Needs: No Transportation Needs   Lack of Transportation (Medical): No   Lack of Transportation (Non-Medical): No  Physical Activity: Inactive   Days of Exercise per Week: 0 days   Minutes of Exercise per Session: 0 min  Stress: No Stress Concern Present   Feeling of Stress : Not at all  Social Connections: Moderately Isolated   Frequency of Communication with Friends and Family: More than three times a week   Frequency of Social Gatherings with Friends and Family: More than three times a week   Attends Religious Services: More than 4 times per year   Active Member of [employer] or Organizations: No   Attends Archivist Meetings: Never   Marital Status: Widowed     Family History: The patient's family history includes CVA in her mother; Cancer in her father; Pulmonary embolism in her brother; Sleep apnea in her brother. There is no history of Anesthesia problems or Breast cancer.  ROS:   Please see the history of present illness.    Denies orthopnea, PND, lower extremity edema.  She does have orthostatic dizziness.  All other systems reviewed and are negative.  EKGs/Labs/Other Studies Reviewed:    The following studies were reviewed today:  Stress myocardial perfusion imaging performed on 12/27/2020:  Study Highlights      Findings are consistent with no prior ischemia and no prior myocardial infarction. The study is low risk.   No ST deviation was noted.   Left ventricular function is normal. Nuclear  stress EF: 63 %. The left ventricular ejection fraction is normal (55-65%). End diastolic cavity size is normal. End systolic cavity size is normal.   Prior study not available for comparison.   Findings: Negative for stress induced  arrhythmias. Baseline LBBB (pharmacologic test).  There is slight decrease in apical counts in the rest that improves with stress.  No evidence of ischemia or infarction.   Conclusions: Stress test is negative. Low risk study.    ECHOCARDIOGRAM 12/2020:  IMPRESSIONS   1. Left ventricular ejection fraction, by estimation, is 60 to 65%. The  left ventricle has normal function. The left ventricle has no regional  wall motion abnormalities. There is mild concentric left ventricular  hypertrophy. Left ventricular diastolic  function could not be evaluated.   2. Right ventricular systolic function is normal. The right ventricular  size is moderately enlarged. There is moderately elevated pulmonary artery  systolic pressure.   3. Left atrial size was moderately dilated.   4. Right atrial size was mild to moderately dilated.   5. The mitral valve is abnormal. Mild mitral valve regurgitation. The  mean mitral valve gradient is 3.0 mmHg with average heart rate of 86 bpm.  Severe mitral annular calcification.   6. The aortic valve is grossly normal. There is mild calcification of the  aortic valve. Aortic valve regurgitation is not visualized. Aortic valve  sclerosis/calcification is present, without any evidence of aortic  stenosis.   7. The inferior vena cava is normal in size with <50% respiratory  variability, suggesting right atrial pressure of 8 mmHg.   Comparison(s): No significant change from prior study.   EKG:  EKG not performed  Recent Labs: 02/06/2020: ALT 13; BNP 122.8; TSH 3.090 12/10/2020: BUN 11; Creatinine, Ser 0.72; Hemoglobin 13.2; NT-Pro BNP 730; Platelets 327; Potassium 4.2; Sodium 132  Recent Lipid Panel    Component Value Date/Time    CHOL 187 02/06/2020 1502   TRIG 108 02/06/2020 1502   HDL 67 02/06/2020 1502   CHOLHDL 2.8 02/06/2020 1502   LDLCALC 101 (H) 02/06/2020 1502    Physical Exam:    VS:  BP (!) 158/92    Pulse 78    Ht 5\' 1"  (1.549 m)    Wt 219 lb 12.8 oz (99.7 kg)    SpO2 99%    BMI 41.53 kg/m     Wt Readings from Last 3 Encounters:  01/08/21 219 lb 12.8 oz (99.7 kg)  12/27/20 224 lb (101.6 kg)  12/16/20 220 lb (99.8 kg)     GEN: Morbid obesity. No acute distress HEENT: Normal NECK: No JVD. LYMPHATICS: No lymphadenopathy CARDIAC: None murmur but body habitus makes it difficult to hear clearly.  RRR no gallop, or edema. VASCULAR:  Normal Pulses. No bruits. RESPIRATORY:  Clear to auscultation without rales, wheezing or rhonchi  ABDOMEN: Soft, non-tender, non-distended, No pulsatile mass, MUSCULOSKELETAL: No deformity  SKIN: Warm and dry NEUROLOGIC:  Alert and oriented x 3 PSYCHIATRIC:  Normal affect   ASSESSMENT:    1. DOE (dyspnea on exertion)   2. Morbid obesity ([place])   3. Essential hypertension   4. Coronary artery calcification seen on CT scan   5. Mitral valve disease   6. Pulmonary hypertension, unspecified ([place])   7. Chronic diastolic heart failure ([place])   8. Orthostatic dizziness    PLAN:    In order of problems listed above:  Stable and not limiting.  No orthopnea lower extremity edema.  Multifactorial including mitral stenosis, diastolic heart failure, and obstructive sleep apnea.  Continue current therapy.  Consider increasing the dose of metoprolol.  Consider adding dapagliflozin. We discussed weight loss.  Blood pressure is elevated but different than what she measures at home.  Target for  age is 140/80 mmHg or less.  If therapy is added, should consider diuretic therapy or dapagliflozin. Intermediate risk nuclear study.  Continue risk prevention. Mitral stenosis is the predominant lesion based on echo.  We have the option of increasing the dose of metoprolol if needed.   We did not change that today. Multifactorial related to issues discussed on #1 Consider dapagliflozin in the future Would need care with diuretic therapy if so chosen   Medication Adjustments/Labs and Tests Ordered: Current medicines are reviewed at length with the patient today.  Concerns regarding medicines are outlined above.  No orders of the defined types were placed in this encounter.  No orders of the defined types were placed in this encounter.   Patient Instructions  Medication Instructions:  Your physician recommends that you continue on your current medications as directed. Please refer to the Current Medication list given to you today.  *If you need a refill on your cardiac medications before your next appointment, please call your pharmacy*   Lab Work: None If you have labs (blood work) drawn today and your tests are completely normal, you will receive your results only by: [place] (if you have MyChart) OR A paper copy in the mail If you have any lab test that is abnormal or we need to change your treatment, we will call you to review the results.   Testing/Procedures: None   Follow-Up: At [facility], you and your health needs are our priority.  As part of our continuing mission to provide you with exceptional heart care, we have created designated Provider Care Teams.  These Care Teams include your primary Cardiologist (physician) and Advanced Practice Providers (APPs -  Physician Assistants and Nurse Practitioners) who all work together to provide you with the care you need, when you need it.  We recommend signing up for the patient portal called "MyChart".  Sign up information is provided on this After Visit Summary.  MyChart is used to connect with patients for Virtual Visits (Telemedicine).  Patients are able to view lab/test results, encounter notes, upcoming appointments, etc.  Non-urgent messages can be sent to your provider as well.   To learn  more about what you can do with MyChart, go to [email or web address].    Your next appointment:   9 month(s)  The format for your next appointment:   In Person  Provider:   Sinclair Grooms, MD     Other Instructions     Signed, Sinclair Grooms, MD  01/08/2021 11:12 AM    [place]

## 2021-01-08 ENCOUNTER — Other Ambulatory Visit: Payer: Self-pay | Admitting: Family

## 2021-01-08 ENCOUNTER — Ambulatory Visit (INDEPENDENT_AMBULATORY_CARE_PROVIDER_SITE_OTHER): Payer: Medicare Other | Admitting: Interventional Cardiology

## 2021-01-08 ENCOUNTER — Other Ambulatory Visit: Payer: Self-pay

## 2021-01-08 ENCOUNTER — Encounter: Payer: Self-pay | Admitting: Interventional Cardiology

## 2021-01-08 VITALS — BP 158/92 | HR 78 | Ht 61.0 in | Wt 219.8 lb

## 2021-01-08 DIAGNOSIS — I2510 Atherosclerotic heart disease of native coronary artery without angina pectoris: Secondary | ICD-10-CM

## 2021-01-08 DIAGNOSIS — I272 Pulmonary hypertension, unspecified: Secondary | ICD-10-CM

## 2021-01-08 DIAGNOSIS — R0609 Other forms of dyspnea: Secondary | ICD-10-CM | POA: Diagnosis not present

## 2021-01-08 DIAGNOSIS — R42 Dizziness and giddiness: Secondary | ICD-10-CM

## 2021-01-08 DIAGNOSIS — I10 Essential (primary) hypertension: Secondary | ICD-10-CM

## 2021-01-08 DIAGNOSIS — E782 Mixed hyperlipidemia: Secondary | ICD-10-CM

## 2021-01-08 DIAGNOSIS — I059 Rheumatic mitral valve disease, unspecified: Secondary | ICD-10-CM

## 2021-01-08 DIAGNOSIS — I5032 Chronic diastolic (congestive) heart failure: Secondary | ICD-10-CM

## 2021-01-08 NOTE — Patient Instructions (Signed)
Medication Instructions:  ?Your physician recommends that you continue on your current medications as directed. Please refer to the Current Medication list given to you today. ? ?*If you need a refill on your cardiac medications before your next appointment, please call your pharmacy* ? ? ?Lab Work: ?None ?If you have labs (blood work) drawn today and your tests are completely normal, you will receive your results only by: ?MyChart Message (if you have MyChart) OR ?A paper copy in the mail ?If you have any lab test that is abnormal or we need to change your treatment, we will call you to review the results. ? ? ?Testing/Procedures: ?None ? ? ?Follow-Up: ?At [facility], you and your health needs are our priority.  As part of our continuing mission to provide you with exceptional heart care, we have created designated Provider Care Teams.  These Care Teams include your primary Cardiologist (physician) and Advanced Practice Providers (APPs -  Physician Assistants and Nurse Practitioners) who all work together to provide you with the care you need, when you need it. ? ?We recommend signing up for the patient portal called "MyChart".  Sign up information is provided on this After Visit Summary.  MyChart is used to connect with patients for Virtual Visits (Telemedicine).  Patients are able to view lab/test results, encounter notes, upcoming appointments, etc.  Non-urgent messages can be sent to your provider as well.   ?To learn more about what you can do with MyChart, go to [email or web address].   ? ?Your next appointment:   ?9 month(s) ? ?The format for your next appointment:   ?In Person ? ?Provider:   ?Henry W Smith III, MD  ? ? ?Other Instructions ?  ?

## 2021-01-30 ENCOUNTER — Other Ambulatory Visit: Payer: Self-pay | Admitting: Family

## 2021-01-30 DIAGNOSIS — Z1231 Encounter for screening mammogram for malignant neoplasm of breast: Secondary | ICD-10-CM

## 2021-02-13 NOTE — Progress Notes (Signed)
Patient ID: Sydney Booth, female    DOB: 06-10-1931  MRN: [id number]  CC: Paperwork  Subjective: Sydney Booth is a 88 y.o. female who presents for paperwork. Her concerns today include:   Reports while recently at the eye doctor [place], [place]) it was recommended she follow-up with primary care for hypertension and heart health. Patient already established with cardiology so she was unsure why she was sent back to primary care. No additional issues/concerns.   Patient Active Problem List   Diagnosis Date Noted   History of adenomatous polyp of colon 12/16/2020   Anxiety 12/16/2020   Decreased estrogen level 12/16/2020   Family history of colon cancer 12/16/2020   Major depression 12/16/2020   Moderate major depression, single episode ([place]) 12/16/2020   Nightmares 12/16/2020   Pure hypercholesterolemia 12/16/2020   History of malignant neoplasm of skin 08/21/2020   [place] 08/21/2020   Neoplasm of uncertain behavior of skin 08/21/2020   Purpura ([place]) 08/21/2020   Skin tag 08/21/2020   Abnormal CXR 04/01/2020   Carpal tunnel syndrome of left wrist 06/08/2018   Polyneuropathy    Syncope    Trigger finger, left index finger 06/04/2016   Impingement syndrome, shoulder, left 10/31/2015   Balance problem 04/11/2015   Neck pain 04/11/2015   Chronic pain of both shoulders 12/20/2014   Status post total left knee replacement 12/20/2014   Benign paroxysmal positional vertigo 09/12/2014   Neuropathic pain of foot 09/12/2014   Rotator cuff syndrome 06/28/2014   Dysfunction of both eustachian tubes 05/30/2014   Blurred vision 11/24/2013   Vitamin D deficiency 03/08/2013   Obstructive sleep apnea syndrome 02/14/2013   Hyperlipidemia 02/09/2013   Arthritis of knee 03/24/2012   Presence of unspecified artificial knee joint 03/24/2012   Dyslipidemia 03/02/2012   Gastroesophageal reflux disease without esophagitis 03/02/2012   Mitral valve prolapse 03/02/2012   Morbid obesity ([place])  01/27/2012   Primary osteoarthritis of right knee 03/24/2011    Class: Chronic   Hypertension 03/24/2011    Class: Chronic     Current Outpatient Medications on File Prior to Visit  Medication Sig Dispense Refill   acetaminophen (TYLENOL) 325 MG tablet Take 2 tablets (650 mg total) by mouth every 6 (six) hours as needed for moderate pain. 30 tablet 0   aspirin 81 MG tablet Take 81 mg by mouth daily.     calcium carbonate (OS-CAL) 600 MG TABS Take 600 mg by mouth daily.     CINNAMON PO Take 500 mg by mouth 2 (two) times daily.     ezetimibe (ZETIA) 10 MG tablet TAKE 1 TABLET BY MOUTH  DAILY 90 tablet 3   metoprolol tartrate (LOPRESSOR) 25 MG tablet TAKE 1 TABLET BY MOUTH  TWICE DAILY 180 tablet 3   Omega-3 Fatty Acids (FISH OIL) 1000 MG CPDR Take 1,000 mg by mouth daily.     omeprazole (PRILOSEC) 40 MG capsule Take 1 capsule (40 mg total) by mouth daily. 90 capsule 1   QUERCETIN PO Take 1,500 mg by mouth daily.     Turmeric 500 MG CAPS Take by mouth daily.     VITAMIN A PO Take 10,000 mg by mouth.     vitamin B-12 (CYANOCOBALAMIN) 1000 MCG tablet Take 1,000 mcg by mouth daily.     VITAMIN D, CHOLECALCIFEROL, PO Take 1 tablet by mouth daily.     Zinc Acetate, Oral, (ZINC ACETATE PO) Take 1 tablet by mouth.     Zinc Oxide 40 % PSTE  Apply 1 application topically as needed. (Patient not taking: Reported on 01/08/2021) 113 g 0   No current facility-administered medications on file prior to visit.    Allergies  Allergen Reactions   Cymbalta [Duloxetine Hcl]    Gabapentin     dizzy   Statins Other (See Comments)   Sulfa Antibiotics Hives and Other (See Comments)   Zocor [Simvastatin]     Muscle aches    Social History   Socioeconomic History   Marital status: Widowed    Spouse name: Not on file   Number of children: 5   Years of education: 68   Highest education level: Not on file  Occupational History   Occupation: Retired   Tobacco Use   Smoking status: Never   Smokeless  tobacco: Never  Vaping Use   Vaping Use: Never used  Substance and Sexual Activity   Alcohol use: No    Alcohol/week: 0.0 standard drinks   Drug use: No   Sexual activity: Not Currently  Other Topics Concern   Not on file  Social History Narrative   Patient lives at home with daughter   Patient is retired   Patient is widowed   Patient has a high school education   Patient has 5 children    Social Determinants of Radio broadcast assistant Strain: Low Risk    Difficulty of Paying Living Expenses: Not hard at all  Food Insecurity: No Food Insecurity   Worried About Charity fundraiser in the Last Year: Never true   Arboriculturist in the Last Year: Never true  Transportation Needs: No Transportation Needs   Lack of Transportation (Medical): No   Lack of Transportation (Non-Medical): No  Physical Activity: Inactive   Days of Exercise per Week: 0 days   Minutes of Exercise per Session: 0 min  Stress: No Stress Concern Present   Feeling of Stress : Not at all  Social Connections: Moderately Isolated   Frequency of Communication with Friends and Family: More than three times a week   Frequency of Social Gatherings with Friends and Family: More than three times a week   Attends Religious Services: More than 4 times per year   Active Member of [employer] or Organizations: No   Attends Archivist Meetings: Never   Marital Status: Widowed  Human resources officer Violence: Not At Risk   Fear of Current or Ex-Partner: No   Emotionally Abused: No   Physically Abused: No   Sexually Abused: No    Family History  Problem Relation Age of Onset   CVA Mother    Cancer Father        COLON, LIVER AND PANCREAS   Sleep apnea Brother    Pulmonary embolism Brother    Anesthesia problems Neg Hx    Breast cancer Neg Hx     Past Surgical History:  Procedure Laterality Date   BIospy      Biospy      Uterus wall   [place] Tunnel     CATARACT EXTRACTION     Colonscopy     EYE  SURGERY     Right 2011, Left 2008   LIPOMA EXCISION     FROM BACK    THUMB FUSION     TOTAL KNEE ARTHROPLASTY  03/24/2011   Procedure: TOTAL KNEE ARTHROPLASTY;  Surgeon: Hessie Dibble, MD;  Location: [facility];  Service: Orthopedics;  Laterality: Left;  with revision tibial component   TUBAL LIGATION  ROS: Review of Systems Negative except as stated above  PHYSICAL EXAM: BP 139/62 (BP Location: Right Arm, Patient Position: Sitting, Cuff Size: Normal)    Pulse 75    Temp 98.3 F (36.8 C)    Resp 18    Ht 5' 0.98" (1.549 m)    Wt 217 lb (98.4 kg)    SpO2 97%    BMI 41.02 kg/m   Physical Exam HENT:     Head: Normocephalic and atraumatic.  Eyes:     Extraocular Movements: Extraocular movements intact.     Conjunctiva/sclera: Conjunctivae normal.     Pupils: Pupils are equal, round, and reactive to light.  Cardiovascular:     Rate and Rhythm: Normal rate and regular rhythm.     Pulses: Normal pulses.     Heart sounds: Normal heart sounds.  Pulmonary:     Effort: Pulmonary effort is normal.     Breath sounds: Normal breath sounds.  Musculoskeletal:     Cervical back: Normal range of motion and neck supple.  Neurological:     General: No focal deficit present.     Mental Status: She is alert and oriented to person, place, and time.  Psychiatric:        Mood and Affect: Mood normal.   ASSESSMENT AND PLAN: 1. Essential hypertension: - Patient recently seen by Daneen Schick, MD at Cardiology on 01/08/2021. Encouraged to keep all scheduled appointments with the same.   Patient was given the opportunity to ask questions.  Patient verbalized understanding of the plan and was able to repeat key elements of the plan. Patient was given clear instructions to go to Emergency Department or return to medical center if symptoms don't improve, worsen, or new problems develop.The patient verbalized understanding.  Follow-up with primary provider as scheduled.  Camillia Herter, NP

## 2021-02-14 ENCOUNTER — Encounter: Payer: Self-pay | Admitting: Family

## 2021-02-14 ENCOUNTER — Other Ambulatory Visit: Payer: Self-pay

## 2021-02-14 ENCOUNTER — Ambulatory Visit (INDEPENDENT_AMBULATORY_CARE_PROVIDER_SITE_OTHER): Payer: Medicare Other | Admitting: Family

## 2021-02-14 VITALS — BP 139/62 | HR 75 | Temp 98.3°F | Resp 18 | Ht 60.98 in | Wt 217.0 lb

## 2021-02-14 DIAGNOSIS — I10 Essential (primary) hypertension: Secondary | ICD-10-CM | POA: Diagnosis not present

## 2021-02-17 ENCOUNTER — Ambulatory Visit: Payer: Medicare Other | Admitting: Family

## 2021-03-10 ENCOUNTER — Ambulatory Visit: Payer: Medicare Other

## 2021-03-31 ENCOUNTER — Ambulatory Visit: Payer: Medicare Other

## 2021-04-07 ENCOUNTER — Ambulatory Visit: Payer: Medicare Other | Admitting: Interventional Cardiology

## 2021-04-23 ENCOUNTER — Telehealth: Payer: Self-pay | Admitting: Family

## 2021-04-23 NOTE — Telephone Encounter (Signed)
Spoke to pt to verify what labs Dr Delman Cheadle was requesting, pt stated she was unsure, adv pt I will contact Dr. Delman Cheadle to verify what labs he would like the patient to complete.  ? ?Contact made to [phone] [facility] office no ans phone just rings will attempt later   ?

## 2021-04-23 NOTE — Telephone Encounter (Signed)
Copied from [place] [phone]. Topic: Appointment Scheduling - Scheduling Inquiry for Clinic ?>> Apr 22, 2021 10:00 AM Sydney Booth wrote: ?Reason for CRM: Pt needs blood work done before her appt with Dr Delman Cheadle for her eyes/ her appt with him is on May 8th / she needed lab drawn before then but no appts were open until May 10th/ please advise if lab orders can be placed and lab appt can be made / pt has transportation to come in for this on April 26th but is ok if another day is open/ please advise ?

## 2021-04-28 NOTE — Telephone Encounter (Signed)
Pt contacted gave new number for [facility] office [phone] , office contacted no ans need to know what labs Delman Cheadle needs for pt appt on 05/12/21 directed to nurse line awaiting return call  ?

## 2021-04-28 NOTE — Telephone Encounter (Signed)
Pt called in to follow up. Pt would like to have a follow up call back to be advised further.  ?

## 2021-05-06 ENCOUNTER — Telehealth: Payer: Self-pay | Admitting: Family

## 2021-05-06 NOTE — Telephone Encounter (Signed)
Copied from [place] [phone]. Topic: General - Other ?>> May 06, 2021  9:39 AM Tessa Lerner A wrote: ?Reason for CRM: The patient has called to follow up with the practice on contact from Dr. Delman Cheadle regarding the patient's upcoming eye appt on 05/12/21 ? ?Please contact further when possible ?

## 2021-05-07 NOTE — Telephone Encounter (Signed)
Spoke w/pt advised was unsuccessful with contacting Dr Delman Cheadle ofc to confirm what labs he needed the provider to order.the patient states that she will just go to appt Monday and will let us know what he wants afterwards ?

## 2021-05-11 ENCOUNTER — Other Ambulatory Visit: Payer: Self-pay | Admitting: Family

## 2021-05-11 DIAGNOSIS — K219 Gastro-esophageal reflux disease without esophagitis: Secondary | ICD-10-CM

## 2021-05-12 NOTE — Telephone Encounter (Signed)
Omeprazole refilled per patient request. ?

## 2021-06-12 ENCOUNTER — Encounter: Payer: Self-pay | Admitting: Family

## 2021-07-29 ENCOUNTER — Other Ambulatory Visit: Payer: Self-pay | Admitting: Family

## 2021-07-29 DIAGNOSIS — K219 Gastro-esophageal reflux disease without esophagitis: Secondary | ICD-10-CM

## 2021-08-12 ENCOUNTER — Other Ambulatory Visit: Payer: Self-pay | Admitting: *Deleted

## 2021-08-12 NOTE — Patient Outreach (Signed)
Care Coordination   08/12/2021 Name: Sydney Booth MRN: [id number] DOB: [birth year]   Care Coordination Outreach Attempts:  An unsuccessful telephone outreach was attempted today to offer the patient information about available care coordination services as a benefit of their health plan.   Follow Up Plan:  Additional outreach attempts will be made to offer the patient care coordination information and services.   Encounter Outcome:  Pt. Refused  Care Coordination Interventions Activated:  No   Care Coordination Interventions:  No, not indicated    Blanchie Serve RN, BSN [facility] [phone] [email or web address]

## 2021-09-29 ENCOUNTER — Encounter: Payer: Self-pay | Admitting: Family

## 2021-09-29 ENCOUNTER — Ambulatory Visit (INDEPENDENT_AMBULATORY_CARE_PROVIDER_SITE_OTHER): Payer: Medicare Other | Admitting: Family

## 2021-09-29 VITALS — BP 150/84 | HR 70 | Temp 98.3°F | Resp 18 | Ht 60.98 in | Wt 211.0 lb

## 2021-09-29 DIAGNOSIS — K219 Gastro-esophageal reflux disease without esophagitis: Secondary | ICD-10-CM

## 2021-09-29 DIAGNOSIS — Z1231 Encounter for screening mammogram for malignant neoplasm of breast: Secondary | ICD-10-CM

## 2021-09-29 DIAGNOSIS — Z0000 Encounter for general adult medical examination without abnormal findings: Secondary | ICD-10-CM | POA: Diagnosis not present

## 2021-09-29 DIAGNOSIS — Z8679 Personal history of other diseases of the circulatory system: Secondary | ICD-10-CM

## 2021-09-29 DIAGNOSIS — Z130 Encounter for screening for diseases of the blood and blood-forming organs and certain disorders involving the immune mechanism: Secondary | ICD-10-CM | POA: Diagnosis not present

## 2021-09-29 DIAGNOSIS — Z131 Encounter for screening for diabetes mellitus: Secondary | ICD-10-CM

## 2021-09-29 DIAGNOSIS — Z0001 Encounter for general adult medical examination with abnormal findings: Secondary | ICD-10-CM

## 2021-09-29 DIAGNOSIS — Z23 Encounter for immunization: Secondary | ICD-10-CM

## 2021-09-29 DIAGNOSIS — R7309 Other abnormal glucose: Secondary | ICD-10-CM

## 2021-09-29 DIAGNOSIS — Z13228 Encounter for screening for other metabolic disorders: Secondary | ICD-10-CM

## 2021-09-29 DIAGNOSIS — Z1329 Encounter for screening for other suspected endocrine disorder: Secondary | ICD-10-CM

## 2021-09-29 DIAGNOSIS — Z8709 Personal history of other diseases of the respiratory system: Secondary | ICD-10-CM

## 2021-09-29 DIAGNOSIS — M859 Disorder of bone density and structure, unspecified: Secondary | ICD-10-CM

## 2021-09-29 DIAGNOSIS — Z0120 Encounter for dental examination and cleaning without abnormal findings: Secondary | ICD-10-CM

## 2021-09-29 DIAGNOSIS — M8580 Other specified disorders of bone density and structure, unspecified site: Secondary | ICD-10-CM | POA: Diagnosis not present

## 2021-09-29 MED ORDER — OMEPRAZOLE 40 MG PO CPDR: 40.0000 mg | DELAYED_RELEASE_CAPSULE | Freq: Every day | ORAL | 0 refills | Status: DC

## 2021-09-29 MED ORDER — AMOXICILLIN 500 MG PO TABS: 2000.0000 mg | ORAL_TABLET | Freq: Once | ORAL | 0 refills | Status: AC

## 2021-09-29 NOTE — Patient Instructions (Signed)
Preventive Care 65 Years and Older, Female Preventive care refers to lifestyle choices and visits with your health care provider that can promote health and wellness. Preventive care visits are also called wellness exams. What can I expect for my preventive care visit? Counseling Your health care provider may ask you questions about your: Medical history, including: Past medical problems. Family medical history. Pregnancy and menstrual history. History of falls. Current health, including: Memory and ability to understand (cognition). Emotional well-being. Home life and relationship well-being. Sexual activity and sexual health. Lifestyle, including: Alcohol, nicotine or tobacco, and drug use. Access to firearms. Diet, exercise, and sleep habits. Work and work environment. Sunscreen use. Safety issues such as seatbelt and bike helmet use. Physical exam Your health care provider will check your: Height and weight. These may be used to calculate your BMI (body mass index). BMI is a measurement that tells if you are at a healthy weight. Waist circumference. This measures the distance around your waistline. This measurement also tells if you are at a healthy weight and may help predict your risk of certain diseases, such as type 2 diabetes and high blood pressure. Heart rate and blood pressure. Body temperature. Skin for abnormal spots. What immunizations do I need?  Vaccines are usually given at various ages, according to a schedule. Your health care provider will recommend vaccines for you based on your age, medical history, and lifestyle or other factors, such as travel or where you work. What tests do I need? Screening Your health care provider may recommend screening tests for certain conditions. This may include: Lipid and cholesterol levels. Hepatitis C test. Hepatitis B test. HIV (human immunodeficiency virus) test. STI (sexually transmitted infection) testing, if you are at  risk. Lung cancer screening. Colorectal cancer screening. Diabetes screening. This is done by checking your blood sugar (glucose) after you have not eaten for a while (fasting). Mammogram. Talk with your health care provider about how often you should have regular mammograms. BRCA-related cancer screening. This may be done if you have a family history of breast, ovarian, tubal, or peritoneal cancers. Bone density scan. This is done to screen for osteoporosis. Talk with your health care provider about your test results, treatment options, and if necessary, the need for more tests. Follow these instructions at home: Eating and drinking  Eat a diet that includes fresh fruits and vegetables, whole grains, lean protein, and low-fat dairy products. Limit your intake of foods with high amounts of sugar, saturated fats, and salt. Take vitamin and mineral supplements as recommended by your health care provider. Do not drink alcohol if your health care provider tells you not to drink. If you drink alcohol: Limit how much you have to 0-1 drink a day. Know how much alcohol is in your drink. In the [place] one drink equals one 12 oz bottle of beer (355 mL), one 5 oz glass of wine (148 mL), or one 1 oz glass of hard liquor (44 mL). Lifestyle Brush your teeth every morning and night with fluoride toothpaste. Floss one time each day. Exercise for at least 30 minutes 5 or more days each week. Do not use any products that contain nicotine or tobacco. These products include cigarettes, chewing tobacco, and vaping devices, such as e-cigarettes. If you need help quitting, ask your health care provider. Do not use drugs. If you are sexually active, practice safe sex. Use a condom or other form of protection in order to prevent STIs. Take aspirin only as told by   your health care provider. Make sure that you understand how much to take and what form to take. Work with your health care provider to find out whether it  is safe and beneficial for you to take aspirin daily. Ask your health care provider if you need to take a cholesterol-lowering medicine (statin). Find healthy ways to manage stress, such as: Meditation, yoga, or listening to music. Journaling. Talking to a trusted person. Spending time with friends and family. Minimize exposure to UV radiation to reduce your risk of skin cancer. Safety Always wear your seat belt while driving or riding in a vehicle. Do not drive: If you have been drinking alcohol. Do not ride with someone who has been drinking. When you are tired or distracted. While texting. If you have been using any mind-altering substances or drugs. Wear a helmet and other protective equipment during sports activities. If you have firearms in your house, make sure you follow all gun safety procedures. What's next? Visit your health care provider once a year for an annual wellness visit. Ask your health care provider how often you should have your eyes and teeth checked. Stay up to date on all vaccines. This information is not intended to replace advice given to you by your health care provider. Make sure you discuss any questions you have with your health care provider. Document Revised: 06/19/2020 Document Reviewed: 06/19/2020 Elsevier Patient Education  2023 Elsevier Inc.  

## 2021-09-29 NOTE — Addendum Note (Signed)
Addended by: Camillia Herter on: 09/29/2021 07:08 PM   Modules accepted: Orders

## 2021-09-29 NOTE — Progress Notes (Addendum)
Patient ID: Sydney Booth, female    DOB: 1931/08/10  MRN: [id number]  CC: Annual Exam   Subjective: Sydney Booth is a 88 y.o. female who presents for annual exam.   Her concerns today include:  - Requests refills of Omeprazole.  - Reports history of laryngitis and would like further evaluated.  - Request antibiotic for pending appointment with dentist where she will have teeth removed. Reports primary providers of the past would prescribe the antibiotic for her because the dentist usually would not. I offered patient referral to dentist and she declined stating she is self pay and will call around to dentist offices to see where she will go.  - Reports she has an appointment scheduled soon with Cardiology for chronic care management. Reports her blood pressure is a little higher than normal today because she thought she was only getting an medicare annual wellness visit today instead of an annual exam.    Patient Active Problem List   Diagnosis Date Noted   History of adenomatous polyp of colon 12/16/2020   Anxiety 12/16/2020   Decreased estrogen level 12/16/2020   Family history of colon cancer 12/16/2020   Major depression 12/16/2020   Moderate major depression, single episode ([place]) 12/16/2020   Nightmares 12/16/2020   Pure hypercholesterolemia 12/16/2020   History of skin cancer 08/21/2020   [place] 08/21/2020   Neoplasm of uncertain behavior of skin 08/21/2020   Purpura ([place]) 08/21/2020   Seborrheic keratosis 08/21/2020   Abnormal CXR 04/01/2020   Carpal tunnel syndrome of left wrist 06/08/2018   Polyneuropathy    Syncope    Trigger finger, left index finger 06/04/2016   Impingement syndrome, shoulder, left 10/31/2015   Balance problem 04/11/2015   Neck pain 04/11/2015   Chronic pain of both shoulders 12/20/2014   Status post total left knee replacement 12/20/2014   Benign paroxysmal positional vertigo 09/12/2014   Neuropathic pain of foot 09/12/2014   Rotator cuff  syndrome 06/28/2014   Dysfunction of both eustachian tubes 05/30/2014   Blurred vision 11/24/2013   Vitamin D deficiency 03/08/2013   Obstructive sleep apnea syndrome 02/14/2013   Hyperlipidemia 02/09/2013   Arthritis of knee 03/24/2012   Presence of unspecified artificial knee joint 03/24/2012   Dyslipidemia 03/02/2012   Gastroesophageal reflux disease without esophagitis 03/02/2012   Mitral valve prolapse 03/02/2012   Morbid obesity ([place]) 01/27/2012   Primary osteoarthritis of right knee 03/24/2011    Class: Chronic   Hypertension 03/24/2011    Class: Chronic     Current Outpatient Medications on File Prior to Visit  Medication Sig Dispense Refill   acetaminophen (TYLENOL) 325 MG tablet Take 2 tablets (650 mg total) by mouth every 6 (six) hours as needed for moderate pain. 30 tablet 0   aspirin 81 MG tablet Take 81 mg by mouth daily.     calcium carbonate (OS-CAL) 600 MG TABS Take 600 mg by mouth daily.     CINNAMON PO Take 500 mg by mouth 2 (two) times daily.     ezetimibe (ZETIA) 10 MG tablet TAKE 1 TABLET BY MOUTH  DAILY 90 tablet 3   metoprolol tartrate (LOPRESSOR) 25 MG tablet TAKE 1 TABLET BY MOUTH  TWICE DAILY 180 tablet 3   Omega-3 Fatty Acids (FISH OIL) 1000 MG CPDR Take 1,000 mg by mouth daily.     QUERCETIN PO Take 1,500 mg by mouth daily.     Turmeric 500 MG CAPS Take by mouth daily.     VITAMIN  A PO Take 10,000 mg by mouth.     vitamin B-12 (CYANOCOBALAMIN) 1000 MCG tablet Take 1,000 mcg by mouth daily.     VITAMIN D, CHOLECALCIFEROL, PO Take 1 tablet by mouth daily.     Zinc Acetate, Oral, (ZINC ACETATE PO) Take 1 tablet by mouth.     Zinc Oxide 40 % PSTE Apply 1 application topically as needed. (Patient not taking: Reported on 01/08/2021) 113 g 0   No current facility-administered medications on file prior to visit.    Allergies  Allergen Reactions   Cymbalta [Duloxetine Hcl]    Gabapentin     dizzy   Statins Other (See Comments)   Sulfa Antibiotics Hives,  Other (See Comments) and Nausea And Vomiting   Zocor [Simvastatin]     Muscle aches    Social History   Socioeconomic History   Marital status: Widowed    Spouse name: Not on file   Number of children: 5   Years of education: 4   Highest education level: Not on file  Occupational History   Occupation: Retired   Tobacco Use   Smoking status: Never    Passive exposure: Never   Smokeless tobacco: Never  Vaping Use   Vaping Use: Never used  Substance and Sexual Activity   Alcohol use: No    Alcohol/week: 0.0 standard drinks of alcohol   Drug use: No   Sexual activity: Not Currently  Other Topics Concern   Not on file  Social History Narrative   Patient lives at home with daughter   Patient is retired   Patient is widowed   Patient has a high school education   Patient has 5 children    Social Determinants of Radio broadcast assistant Strain: Low Risk  (09/18/2020)   Overall Financial Resource Strain (CARDIA)    Difficulty of Paying Living Expenses: Not hard at all  Food Insecurity: No Food Insecurity (09/18/2020)   Hunger Vital Sign    Worried About Running Out of Food in the Last Year: Never true    [place] in the Last Year: Never true  Transportation Needs: No Transportation Needs (09/18/2020)   PRAPARE - Hydrologist (Medical): No    Lack of Transportation (Non-Medical): No  Physical Activity: Inactive (09/18/2020)   Exercise Vital Sign    Days of Exercise per Week: 0 days    Minutes of Exercise per Session: 0 min  Stress: No Stress Concern Present (09/18/2020)   [place]    Feeling of Stress : Not at all  Social Connections: Moderately Isolated (09/18/2020)   Social Connection and Isolation Panel [NHANES]    Frequency of Communication with Friends and Family: More than three times a week    Frequency of Social Gatherings with Friends and Family: More than  three times a week    Attends Religious Services: More than 4 times per year    Active Member of [employer] or Organizations: No    Attends Archivist Meetings: Never    Marital Status: Widowed  Intimate Partner Violence: Not At Risk (09/18/2020)   Humiliation, Afraid, Rape, and Kick questionnaire    Fear of Current or Ex-Partner: No    Emotionally Abused: No    Physically Abused: No    Sexually Abused: No    Family History  Problem Relation Age of Onset   CVA Mother    Cancer Father  COLON, LIVER AND PANCREAS   Sleep apnea Brother    Pulmonary embolism Brother    Anesthesia problems Neg Hx    Breast cancer Neg Hx     Past Surgical History:  Procedure Laterality Date   BIospy      Biospy      Uterus wall   [place] Tunnel     CATARACT EXTRACTION     Colonscopy     EYE SURGERY     Right 2011, Left 2008   LIPOMA EXCISION     FROM BACK    THUMB FUSION     TOTAL KNEE ARTHROPLASTY  03/24/2011   Procedure: TOTAL KNEE ARTHROPLASTY;  Surgeon: Hessie Dibble, MD;  Location: [facility];  Service: Orthopedics;  Laterality: Left;  with revision tibial component   TUBAL LIGATION      ROS: Review of Systems Negative except as stated above  PHYSICAL EXAM: BP (!) 150/84 (BP Location: Right Arm, Patient Position: Sitting, Cuff Size: Large)   Pulse 70   Temp 98.3 F (36.8 C)   Resp 18   Ht 5' 0.98" (1.549 m)   Wt 211 lb (95.7 kg)   SpO2 95%   BMI 39.89 kg/m   Physical Exam HENT:     Head: Normocephalic and atraumatic.     Right Ear: Tympanic membrane, ear canal and external ear normal.     Left Ear: Tympanic membrane, ear canal and external ear normal.     Nose: Nose normal.     Mouth/Throat:     Mouth: Mucous membranes are moist.     Pharynx: Oropharynx is clear.  Eyes:     Extraocular Movements: Extraocular movements intact.     Conjunctiva/sclera: Conjunctivae normal.     Pupils: Pupils are equal, round, and reactive to light.  Cardiovascular:     Rate  and Rhythm: Normal rate and regular rhythm.     Pulses: Normal pulses.     Heart sounds: Normal heart sounds.  Pulmonary:     Effort: Pulmonary effort is normal.     Breath sounds: Normal breath sounds.  Chest:     Comments: Patient declined.  Abdominal:     General: Bowel sounds are normal.     Palpations: Abdomen is soft.  Genitourinary:    Comments: Patient declined.  Musculoskeletal:        General: Normal range of motion.     Right shoulder: Normal.     Left shoulder: Normal.     Right upper arm: Normal.     Left upper arm: Normal.     Right elbow: Normal.     Left elbow: Normal.     Right forearm: Normal.     Left forearm: Normal.     Right wrist: Normal.     Left wrist: Normal.     Right hand: Normal.     Left hand: Normal.     Cervical back: Normal, normal range of motion and neck supple.     Thoracic back: Normal.     Lumbar back: Normal.     Right hip: Normal.     Left hip: Normal.     Right upper leg: Normal.     Left upper leg: Normal.     Right knee: Normal.     Left knee: Normal.     Right lower leg: Normal.     Left lower leg: Normal.     Right ankle: Normal.     Left ankle: Normal.  Right foot: Normal.     Left foot: Normal.  Skin:    General: Skin is warm and dry.     Capillary Refill: Capillary refill takes less than 2 seconds.  Neurological:     General: No focal deficit present.     Mental Status: She is alert and oriented to person, place, and time.  Psychiatric:        Mood and Affect: Mood normal.        Behavior: Behavior normal.    ASSESSMENT AND PLAN: Patient today scheduled for Medicare Annual Wellness Visit. During patient rooming patient reports to [place] that she does not wish to have a Medicare Annual Wellness Visit and requested an Annual Exam. When I entered patient's room I confirmed with patient report from [place] and she stated this is correct. Informed patient she may generate a bill not covered by health insurance for today's  appointment and she verbalized understanding and agreement. Discussed with patient we will schedule a separate visit for Medicare Annual Wellness Visit and she is agreeable.   1. Annual physical exam - Counseled on 150 minutes of exercise per week as tolerated, healthy eating (including decreased daily intake of saturated fats, cholesterol, added sugars, sodium), STI prevention, and routine healthcare maintenance.  2. Screening for metabolic disorder - HTD42+AJGO to check kidney function, liver function, and electrolyte balance.  - CMP14+EGFR  3. Screening for deficiency anemia 4. Disorder of bone density and structure, unspecified - CBC to screen for anemia. - CBC  5. Diabetes mellitus screening 6. Other abnormal glucose - Hemoglobin A1c to screen for pre-diabetes/diabetes. - Hemoglobin A1c  7. Thyroid disorder screen - TSH to check thyroid function.  - TSH  8. Encounter for screening mammogram for malignant neoplasm of breast - Referral for breast cancer screening by mammogram.  - MM Digital Screening; Future  9. Osteopenia, unspecified location - Referral to Rheumatology for further evaluation and management.  - Ambulatory referral to Rheumatology  10. Gastroesophageal reflux disease without esophagitis - Continue Omeprazole as prescribed.  - Follow-up with primary provider as scheduled. - omeprazole (PRILOSEC) 40 MG capsule; Take 1 capsule (40 mg total) by mouth daily.  Dispense: 90 capsule; Refill: 0  11. Encounter for routine dental examination 12. History of mitral valve prolapse - Amoxicillin as prescribed.  - amoxicillin (AMOXIL) 500 MG tablet; Take 4 tablets (2,000 mg total) by mouth once for 1 dose. Take prior to dental procedures.  Dispense: 4 tablet; Refill: 0  13. History of laryngitis - Referral to ENT for further evaluation and management.  - Ambulatory referral to ENT    Patient was given the opportunity to ask questions.  Patient verbalized  understanding of the plan and was able to repeat key elements of the plan. Patient was given clear instructions to go to Emergency Department or return to medical center if symptoms don't improve, worsen, or new problems develop.The patient verbalized understanding.   Orders Placed This Encounter  Procedures   MM Digital Screening   CMP14+EGFR   CBC   Hemoglobin A1c   TSH   Ambulatory referral to Rheumatology     Requested Prescriptions   Signed Prescriptions Disp Refills   omeprazole (PRILOSEC) 40 MG capsule 90 capsule 0    Sig: Take 1 capsule (40 mg total) by mouth daily.   amoxicillin (AMOXIL) 500 MG tablet 4 tablet 0    Sig: Take 4 tablets (2,000 mg total) by mouth once for 1 dose. Take prior to dental procedures.  Return in 1 year (on 09/30/2022) for Physical per patient preference.  Camillia Herter, NP

## 2021-09-29 NOTE — Progress Notes (Signed)
Pt presents for annual physical declined Medicare visit  -pt aware that Medicare does not cover physicals, states she will pay for any expenses associated w/visit  -advised pt will contact her to setup appt to do AWV by phone for questions only

## 2021-09-30 ENCOUNTER — Encounter: Payer: Self-pay | Admitting: Family

## 2021-09-30 DIAGNOSIS — R7303 Prediabetes: Secondary | ICD-10-CM | POA: Insufficient documentation

## 2021-09-30 LAB — TSH: TSH: 2.16 u[IU]/mL (ref 0.450–4.500)

## 2021-09-30 LAB — CMP14+EGFR
ALT: 10 IU/L (ref 0–32)
AST: 21 IU/L (ref 0–40)
Albumin/Globulin Ratio: 1.6 (ref 1.2–2.2)
Albumin: 4.1 g/dL (ref 3.7–4.7)
Alkaline Phosphatase: 124 IU/L — ABNORMAL HIGH (ref 44–121)
BUN/Creatinine Ratio: 16 (ref 12–28)
BUN: 12 mg/dL (ref 8–27)
Bilirubin Total: 0.5 mg/dL (ref 0.0–1.2)
CO2: 20 mmol/L (ref 20–29)
Calcium: 9.4 mg/dL (ref 8.7–10.3)
Chloride: 97 mmol/L (ref 96–106)
Creatinine, Ser: 0.73 mg/dL (ref 0.57–1.00)
Globulin, Total: 2.5 g/dL (ref 1.5–4.5)
Glucose: 93 mg/dL (ref 70–99)
Potassium: 4.2 mmol/L (ref 3.5–5.2)
Sodium: 133 mmol/L — ABNORMAL LOW (ref 134–144)
Total Protein: 6.6 g/dL (ref 6.0–8.5)
eGFR: 79 mL/min/{1.73_m2} (ref >59)

## 2021-09-30 LAB — CBC
Hematocrit: 39.4 % (ref 34.0–46.6)
Hemoglobin: 13.1 g/dL (ref 11.1–15.9)
MCH: 29.4 pg (ref 26.6–33.0)
MCHC: 33.2 g/dL (ref 31.5–35.7)
MCV: 88 fL (ref 79–97)
Platelets: 286 10*3/uL (ref 150–450)
RBC: 4.46 x10E6/uL (ref 3.77–5.28)
RDW: 12.5 % (ref 11.7–15.4)
WBC: 4.2 10*3/uL (ref 3.4–10.8)

## 2021-09-30 LAB — HEMOGLOBIN A1C
Est. average glucose Bld gHb Est-mCnc: 117 mg/dL
Hgb A1c MFr Bld: 5.7 % — ABNORMAL HIGH (ref 4.8–5.6)

## 2021-10-03 ENCOUNTER — Telehealth: Payer: Self-pay | Admitting: Family

## 2021-10-03 NOTE — Progress Notes (Addendum)
Office Visit Note  Patient: Sydney Booth             Date of Birth: [birth year]           MRN: [id number]             PCP: Camillia Herter, NP Referring: Camillia Herter, NP Visit Date: 10/13/2021 Occupation: '@GUAROCC'$ @  Subjective:  Discuss DEXA   History of Present Illness: Sydney Booth is a 88 y.o. female who presents today as a new patient for consultation regarding management of osteopenia as requested by her PCP.  Patient's last bone density was in July 2022.  She is not currently taking any prescription medications for the management of osteopenia.  She has been taking a calcium and vitamin D supplement daily.  She denies any recent falls or fractures.  She states that she has no history of vertebral fractures or stress fractures that she is aware of.  She denies any hip fractures.  She has been using a cane to assist with ambulation.  Patient reports that she sees her chiropractor every 3 weeks and follows up closely with her orthopedist at [place], Dr. Rip Harbour.  She states that she had a fall about 1-1/2 years ago and injured her shoulders.  She has had cortisone injections in her shoulders in the past.  She states that she has chronic pain in her right knee joint but does not plan on having a knee replacement.  She states that her left knee replacement performed by Dr. Derek Jack overall has been doing well.  She states that she thought she was referred to [name] for the swelling she is experiencing in both feet.  She is unsure if the swelling is in the joint or due to fluid retention.  She states that both feet have diminished sensation due to neuropathy.  Activities of Daily Living:  Patient reports morning stiffness for 0 minutes.   Patient Denies nocturnal pain.  Difficulty dressing/grooming: Denies Difficulty climbing stairs: Denies Difficulty getting out of chair: Denies Difficulty using hands for taps, buttons, cutlery, and/or writing: Reports  Review of Systems   Constitutional:  Negative for fatigue.  HENT:  Negative for mouth sores and mouth dryness.   Eyes:  Negative for dryness.  Respiratory:  Positive for shortness of breath.   Cardiovascular:  Negative for chest pain and palpitations.  Gastrointestinal:  Negative for blood in stool, constipation and diarrhea.  Endocrine: Negative for increased urination.  Genitourinary:  Negative for involuntary urination.  Musculoskeletal:  Positive for joint pain, gait problem, joint pain, joint swelling, myalgias, muscle weakness, muscle tenderness and myalgias. Negative for morning stiffness.  Skin:  Negative for color change, rash, hair loss and sensitivity to sunlight.  Allergic/Immunologic: Negative for susceptible to infections.  Neurological:  Positive for dizziness and numbness. Negative for headaches.  Hematological:  Negative for swollen glands.  Psychiatric/Behavioral:  Positive for sleep disturbance. Negative for depressed mood. The patient is not nervous/anxious.     PMFS History:  Patient Active Problem List   Diagnosis Date Noted   Prediabetes 09/30/2021   History of adenomatous polyp of colon 12/16/2020   Anxiety 12/16/2020   Decreased estrogen level 12/16/2020   Family history of colon cancer 12/16/2020   Major depression 12/16/2020   Moderate major depression, single episode ([place]) 12/16/2020   Nightmares 12/16/2020   Pure hypercholesterolemia 12/16/2020   History of skin cancer 08/21/2020   Milia 08/21/2020   Neoplasm of uncertain behavior of skin 08/21/2020  Purpura ([place]) 08/21/2020   Seborrheic keratosis 08/21/2020   Abnormal CXR 04/01/2020   Carpal tunnel syndrome of left wrist 06/08/2018   Polyneuropathy    Syncope    Trigger finger, left index finger 06/04/2016   Impingement syndrome, shoulder, left 10/31/2015   Balance problem 04/11/2015   Neck pain 04/11/2015   Chronic pain of both shoulders 12/20/2014   Status post total left knee replacement 12/20/2014   Benign  paroxysmal positional vertigo 09/12/2014   Neuropathic pain of foot 09/12/2014   Rotator cuff syndrome 06/28/2014   Dysfunction of both eustachian tubes 05/30/2014   Blurred vision 11/24/2013   Vitamin D deficiency 03/08/2013   Obstructive sleep apnea syndrome 02/14/2013   Hyperlipidemia 02/09/2013   Arthritis of knee 03/24/2012   Presence of unspecified artificial knee joint 03/24/2012   Dyslipidemia 03/02/2012   Gastroesophageal reflux disease without esophagitis 03/02/2012   Mitral valve prolapse 03/02/2012   Morbid obesity ([place]) 01/27/2012   Primary osteoarthritis of right knee 03/24/2011    Class: Chronic   Hypertension 03/24/2011    Class: Chronic    Past Medical History:  Diagnosis Date   Arthritis    Blood transfusion    Cervical vertebral fracture (HCC)    fued on its on   Closed head injury    as a chlid   Coronary artery calcification seen on CT scan    Fall 12/22/2017   right eye bruised   Gastric ulcer    GERD (gastroesophageal reflux disease)    H/O hiatal hernia    Headache(784.0)    Hyperlipemia    Hypertension    Left carpal tunnel syndrome 06/08/2018   Mild pulmonary hypertension (HCC)    Mitral regurgitation    Mitral stenosis    Mitral valve prolapse    Obesity    OSA on CPAP    Osteopenia    Rotator cuff tear    Left   Seasonal allergies    Shortness of breath    With activity   Ulcer 1991   Bledding     Family History  Problem Relation Age of Onset   Stroke Mother    Cancer Father        COLON, LIVER AND PANCREAS   Heart disease Brother    Sleep apnea Brother    Sleep apnea Brother    Pulmonary embolism Brother    ALS Son    Tourette syndrome Son    Healthy Daughter    Autism Grandson    Anesthesia problems Neg Hx    Breast cancer Neg Hx    Past Surgical History:  Procedure Laterality Date   BIospy      Biospy      Uterus wall   [place] Tunnel     CATARACT EXTRACTION     Colonscopy     EYE SURGERY     Right 2011, Left  2008   LIPOMA EXCISION     FROM BACK    THUMB FUSION     TOTAL KNEE ARTHROPLASTY  03/24/2011   Procedure: TOTAL KNEE ARTHROPLASTY;  Surgeon: Hessie Dibble, MD;  Location: [place];  Service: Orthopedics;  Laterality: Left;  with revision tibial component   TUBAL LIGATION     Social History   Social History Narrative   Patient lives at home with daughter   Patient is retired   Patient is widowed   Patient has a high school education   Patient has 5 children    Immunization History  Administered Date(s) Administered   Influenza Split 10/10/2008, 10/10/2009, 11/16/2010   PFIZER(Purple Top)SARS-COV-2 Vaccination 03/31/2019, 04/21/2019, 12/15/2019, 06/19/2020   Pneumococcal Conjugate-13 11/17/2013   Pneumococcal Polysaccharide-23 12/11/1998, 01/06/2007, 06/17/2016   Td 12/11/2007   Tdap 05/18/2012, 12/22/2017, 06/19/2020   Zoster Recombinat (Shingrix) 08/21/2020   Zoster, Live 05/18/2012, 06/15/2019     Objective: Vital Signs: BP (!) 154/80 (BP Location: Right Arm, Patient Position: Sitting, Cuff Size: Large)   Pulse 73   Resp 15   Ht 5' (1.524 m)   Wt 218 lb 9.6 oz (99.2 kg)   BMI 42.69 kg/m    Physical Exam Vitals and nursing note reviewed.  Constitutional:      Appearance: She is well-developed.  HENT:     Head: Normocephalic and atraumatic.  Eyes:     Conjunctiva/sclera: Conjunctivae normal.  Cardiovascular:     Rate and Rhythm: Normal rate and regular rhythm.     Heart sounds: Normal heart sounds.  Pulmonary:     Effort: Pulmonary effort is normal.     Breath sounds: Normal breath sounds.  Abdominal:     General: Bowel sounds are normal.     Palpations: Abdomen is soft.  Musculoskeletal:     Cervical back: Normal range of motion.  Skin:    General: Skin is warm and dry.     Capillary Refill: Capillary refill takes less than 2 seconds.  Neurological:     Mental Status: She is alert and oriented to person, place, and time.  Psychiatric:        Behavior:  Behavior normal.      Musculoskeletal Exam: C-spine has good range of motion with no discomfort.  Postural thoracic kyphosis noted.  No midline spinal tenderness or SI joint tenderness.  Shoulder joints have good range of motion with some discomfort in the left shoulder.  Elbow joints, wrist joints, MCPs, PIPs, DIPs have good range of motion with no synovitis.  She has PIP and DIP thickening consistent with osteoarthritis of both hands.  Hip joints have good range of motion with no groin pain.  Left knee replacement has good range of motion.  Right knee has good range of motion with some discomfort but no warmth or effusion was noted.  Ankle joints have good range of motion with no tenderness or synovitis.  No tenderness or synovitis over MTP joints.  Bunions noted bilaterally.  Thickening of bilateral first MTP joints noted.  PIP and DIP thickening consistent with osteoarthritis of both feet.  No evidence of Achilles tendinitis or plantar fasciitis.  Pedal edema noted in bilateral lower extremities. Sock line indentation noted bilaterally.    CDAI Exam: CDAI Score: -- Patient Global: --; Provider Global: -- Swollen: --; Tender: -- Joint Exam 10/13/2021   No joint exam has been documented for this visit   There is currently no information documented on the homunculus. Go to the Rheumatology activity and complete the homunculus joint exam.  Investigation: No additional findings.  Imaging: No results found.  Recent Labs: Lab Results  Component Value Date   WBC 4.2 09/29/2021   HGB 13.1 09/29/2021   PLT 286 09/29/2021   NA 133 (L) 09/29/2021   K 4.2 09/29/2021   CL 97 09/29/2021   CO2 20 09/29/2021   GLUCOSE 93 09/29/2021   BUN 12 09/29/2021   CREATININE 0.73 09/29/2021   BILITOT 0.5 09/29/2021   ALKPHOS 124 (H) 09/29/2021   AST 21 09/29/2021   ALT 10 09/29/2021   PROT 6.6 09/29/2021  ALBUMIN 4.1 09/29/2021   CALCIUM 9.4 09/29/2021   GFRAA 80 02/06/2020    Speciality  Comments: No specialty comments available.  Procedures:  No procedures performed Allergies: Cymbalta [duloxetine hcl], Gabapentin, Statins, Sulfa antibiotics, and Zocor [simvastatin]   Assessment / Plan:     Visit Diagnoses: Osteopenia of multiple sites - DEXA 07/10/20: The BMD measured at Forearm Radius 33% is 0.678 g/cm2 with a T-scoreof -2.4.  DEXA results were reviewed with the patient today in detail.  Discussed the diagnosis tips of osteopenia.  She is not currently taking a prescription medication for osteopenia.  She has been taking calcium 600 mg daily and vitamin D 5000 units daily.  She has not had any recent falls or fractures.  No history of hip fractures or vertebral fractures.  She uses a cane to assist with ambulation.  She has had cortisone injections in the past but no systemic long-term prednisone use.   The patient was unaware that the referral to [name] was placed for management of osteopenia. The following lab work will be obtained today for further evaluation.  Discussed that the current recommendations for management of osteopenia include the use of calcium, vitamin D, and performing resistive exercises.  Her next bone density will be due in July 2024. - Plan: VITAMIN D 25 Hydroxy (Vit-D Deficiency, Fractures), Serum protein electrophoresis with reflex, Parathyroid hormone, intact (no Ca)  Vitamin D deficiency -Vitamin D level will be checked today.  She has been taking vitamin D 5,000 units daily.  She was given informational handout about calcium and vitamin D supplementations as well as with a list of calcium rich foods.  Plan: VITAMIN D 25 Hydroxy (Vit-D Deficiency, Fractures), Serum protein electrophoresis with reflex, Parathyroid hormone, intact (no Ca)  Impingement syndrome, shoulder, left: Followed by Dr. Rip Harbour at [place].  Primary osteoarthritis of right knee: She has good ROM of the right knee with discomfort.  No warmth or effusion at this time.  She does  not plan on proceeding with a knee replacement.  She is currently using a cane to assist with ambulation.  Status post total left knee replacement: Performed by Dr. Latanya Maudlin.  Overall doing well.  No warmth or effusion noted.  Pain in both feet - Patient presents today with swelling and stiffness in both feet.  She is unsure if the swelling in her feet and lower legs is due to fluid retention or if the swelling is in the joint.  She notices more swelling when she is more sedentary.  She has been using a cane to assist with ambulation. She has no known family history of autoimmune disease.  According to the patient and her mother had arthritis but she is unsure of what type.  On examination she has no tenderness or synovitis over MTP joints.  Both ankle joints have good range of motion with no tenderness or synovitis.  No evidence of Achilles tendinitis or planter fasciitis.  She has PIP and DIP thickening consistent with osteoarthritis of both feet.  Thickening of bilateral first MTP joints with some overcrowding of the toes noted.  Scattered varicose veins noted bilateral lower extremities.  X-rays of both feet were obtained today for further evaluation.  The following lab work was also obtained today to complete the work-up.  Most of her discomfort and swelling seems to be due to osteoarthritis, neuropathy, and underlying pedal edema.  I do not see any inflammation consistent with inflammatory arthropathy.   Plan: XR Foot 2 Views Right,  XR Foot 2 Views Left, Sedimentation rate, Rheumatoid factor, Cyclic citrul peptide antibody, IgG, C-reactive protein  Trigger finger, left index finger: Resolved.   Carpal tunnel syndrome of left wrist: Patient is apprehensive to proceed with surgical release.  Discussed the use of a left carpal tunnel night brace.   Polyneuropathy: She has symptoms of neuropathy in both feet.  On examination today she has diminished sensation to touch in both feet.    Other medical  conditions are listed as follows:   Seborrheic keratosis  Gastroesophageal reflux disease without esophagitis  Obstructive sleep apnea syndrome  Primary hypertension  Mitral valve prolapse  Pure hypercholesterolemia  Prediabetes: Hemoglobin was 5.7% on 09/29/2021.  Moderate major depression, single episode (HCC)  History of skin cancer  History of adenomatous polyp of colon  Family history of colon cancer  Orders: Orders Placed This Encounter  Procedures   XR Foot 2 Views Right   XR Foot 2 Views Left   VITAMIN D 25 Hydroxy (Vit-D Deficiency, Fractures)   Sedimentation rate   Rheumatoid factor   Cyclic citrul peptide antibody, IgG   C-reactive protein   Serum protein electrophoresis with reflex   Parathyroid hormone, intact (no Ca)   No orders of the defined types were placed in this encounter.   Follow-Up Instructions: Return in 4 weeks (on 11/10/2021) for NPFU.   Ofilia Neas, PA-C  Note - This record has been created using Dragon software.  Chart creation errors have been sought, but may not always  have been located. Such creation errors do not reflect on  the standard of medical care.

## 2021-10-03 NOTE — Telephone Encounter (Signed)
Pt called to speak with Amy about different things that were suppose to be done / pt is waiting on antibiotic for a dental procedure / please advise

## 2021-10-05 NOTE — Progress Notes (Unsigned)
Cardiology Office Note:    Date:  10/06/2021   ID:  Sydney Booth, DOB February 27, 1931, MRN [id number]  PCP:  Rema Fendt, NP  Cardiologist:  Lesleigh Noe, MD   Referring MD: Rema Fendt, NP   Chief Complaint  Patient presents with   Congestive Heart Failure   Shortness of Breath    History of Present Illness:    Sydney Booth is a 88 y.o. female with a hx of HTN, HLD, OSA on CPAP, obesity, coronary calcification on CT, neuropathy, mitral stenosis and mitral regurgitation, and chronic diastolic HF.(Originally from [place] but has lived in [place] and [place].)  Major limitations from dyspnea on exertion.  She does have orthopnea as well.  Has seen a pulmonologist since last seeing [name] and noted to have no pulmonary explanation.  She denies chest pain.  She has occasional lower extremity swelling.  She is accompanied by her daughter.  Past Medical History:  Diagnosis Date   Arthritis    Blood transfusion    Cervical vertebral fracture (HCC)    fued on its on   Closed head injury    as a chlid   Coronary artery calcification seen on CT scan    Fall 12/22/2017   right eye bruised   Gastric ulcer    GERD (gastroesophageal reflux disease)    H/O hiatal hernia    Headache(784.0)    Hyperlipemia    Hypertension    Left carpal tunnel syndrome 06/08/2018   Mild pulmonary hypertension (HCC)    Mitral regurgitation    Mitral stenosis    Mitral valve prolapse    Obesity    OSA on CPAP    Osteopenia    Rotator cuff tear    Left   Seasonal allergies    Shortness of breath    With activity   Ulcer 1991   [place]     Past Surgical History:  Procedure Laterality Date   BIospy      Biospy      Uterus wall   [place] Tunnel     CATARACT EXTRACTION     Colonscopy     EYE SURGERY     Right 2011, Left 2008   LIPOMA EXCISION     FROM BACK    THUMB FUSION     TOTAL KNEE ARTHROPLASTY  03/24/2011   Procedure: TOTAL KNEE ARTHROPLASTY;  Surgeon: Velna Ochs, MD;  Location:  [facility] OR;  Service: Orthopedics;  Laterality: Left;  with revision tibial component   TUBAL LIGATION      Current Medications: Current Meds  Medication Sig   acetaminophen (TYLENOL) 325 MG tablet Take 2 tablets (650 mg total) by mouth every 6 (six) hours as needed for moderate pain.   aspirin 81 MG tablet Take 81 mg by mouth daily.   calcium carbonate (OS-CAL) 600 MG TABS Take 600 mg by mouth daily.   CINNAMON PO Take 500 mg by mouth 2 (two) times daily.   dapagliflozin propanediol (FARXIGA) 10 MG TABS tablet Take 1 tablet (10 mg total) by mouth daily before breakfast.   ezetimibe (ZETIA) 10 MG tablet TAKE 1 TABLET BY MOUTH  DAILY   metoprolol tartrate (LOPRESSOR) 25 MG tablet TAKE 1 TABLET BY MOUTH  TWICE DAILY   Omega-3 Fatty Acids (FISH OIL) 1000 MG CPDR Take 1,000 mg by mouth daily.   omeprazole (PRILOSEC) 40 MG capsule Take 1 capsule (40 mg total) by mouth daily.   QUERCETIN PO Take 1,500  mg by mouth daily.   Turmeric 500 MG CAPS Take by mouth daily.   VITAMIN A PO Take 10,000 mg by mouth.   vitamin B-12 (CYANOCOBALAMIN) 1000 MCG tablet Take 1,000 mcg by mouth daily.   VITAMIN D, CHOLECALCIFEROL, PO Take 1 tablet by mouth daily.   Zinc Acetate, Oral, (ZINC ACETATE PO) Take 1 tablet by mouth.   Zinc Oxide 40 % PSTE Apply 1 application topically as needed.     Allergies:   Cymbalta [duloxetine hcl], Gabapentin, Statins, Sulfa antibiotics, and Zocor [simvastatin]   Social History   Socioeconomic History   Marital status: Widowed    Spouse name: Not on file   Number of children: 5   Years of education: 58   Highest education level: Not on file  Occupational History   Occupation: Retired   Tobacco Use   Smoking status: Never    Passive exposure: Never   Smokeless tobacco: Never  Vaping Use   Vaping Use: Never used  Substance and Sexual Activity   Alcohol use: No    Alcohol/week: 0.0 standard drinks of alcohol   Drug use: No   Sexual activity: Not Currently  Other Topics  Concern   Not on file  Social History Narrative   Patient lives at home with daughter   Patient is retired   Patient is widowed   Patient has a high school education   Patient has 5 children    Social Determinants of Corporate investment banker Strain: Low Risk  (09/18/2020)   Overall Financial Resource Strain (CARDIA)    Difficulty of Paying Living Expenses: Not hard at all  Food Insecurity: No Food Insecurity (09/18/2020)   Hunger Vital Sign    Worried About Running Out of Food in the Last Year: Never true    Ran Out of Food in the Last Year: Never true  Transportation Needs: No Transportation Needs (09/18/2020)   PRAPARE - Administrator, Civil Service (Medical): No    Lack of Transportation (Non-Medical): No  Physical Activity: Inactive (09/18/2020)   Exercise Vital Sign    Days of Exercise per Week: 0 days    Minutes of Exercise per Session: 0 min  Stress: No Stress Concern Present (09/18/2020)   [employer] of Occupational Health - Occupational Stress Questionnaire    Feeling of Stress : Not at all  Social Connections: Moderately Isolated (09/18/2020)   Social Connection and Isolation Panel [NHANES]    Frequency of Communication with Friends and Family: More than three times a week    Frequency of Social Gatherings with Friends and Family: More than three times a week    Attends Religious Services: More than 4 times per year    Active Member of [employer] or Organizations: No    Attends Banker Meetings: Never    Marital Status: Widowed     Family History: The patient's family history includes CVA in her mother; Cancer in her father; Pulmonary embolism in her brother; Sleep apnea in her brother. There is no history of Anesthesia problems or Breast cancer.  ROS:   Please see the history of present illness.    Occasional dizziness with vertiginous qualities all other systems reviewed and are negative.  EKGs/Labs/Other Studies Reviewed:      Nuclear stress test December 2022: Study Highlights      Findings are consistent with no prior ischemia and no prior myocardial infarction. The study is low risk.   No ST deviation was  noted.   Left ventricular function is normal. Nuclear stress EF: 63 %. The left ventricular ejection fraction is normal (55-65%). End diastolic cavity size is normal. End systolic cavity size is normal.   Prior study not available for comparison.   Findings: Negative for stress induced arrhythmias. Baseline LBBB (pharmacologic test).  There is slight decrease in apical counts in the rest that improves with stress.  No evidence of ischemia or infarction.    ECHO 2022: IMPRESSIONS     1. Left ventricular ejection fraction, by estimation, is 60 to 65%. The  left ventricle has normal function. The left ventricle has no regional  wall motion abnormalities. There is mild concentric left ventricular  hypertrophy. Left ventricular diastolic  function could not be evaluated.   2. Right ventricular systolic function is normal. The right ventricular  size is moderately enlarged. There is moderately elevated pulmonary artery  systolic pressure.   3. Left atrial size was moderately dilated.   4. Right atrial size was mild to moderately dilated.   5. The mitral valve is abnormal. Mild mitral valve regurgitation. The  mean mitral valve gradient is 3.0 mmHg with average heart rate of 86 bpm.  Severe mitral annular calcification.   6. The aortic valve is grossly normal. There is mild calcification of the  aortic valve. Aortic valve regurgitation is not visualized. Aortic valve  sclerosis/calcification is present, without any evidence of aortic  stenosis.   7. The inferior vena cava is normal in size with <50% respiratory  variability, suggesting right atrial pressure of 8 mmHg.   Comparison(s): No significant change from prior study.     EKG:  EKG normal sinus rhythm, first-degree AV block, left bundle  branch block with QRSD 134 ms.  No change when compared to prior study performed August 2022.  Recent Labs: 12/10/2020: NT-Pro BNP 730 09/29/2021: ALT 10; BUN 12; Creatinine, Ser 0.73; Hemoglobin 13.1; Platelets 286; Potassium 4.2; Sodium 133; TSH 2.160  Recent Lipid Panel    Component Value Date/Time   CHOL 187 02/06/2020 1502   TRIG 108 02/06/2020 1502   HDL 67 02/06/2020 1502   CHOLHDL 2.8 02/06/2020 1502   LDLCALC 101 (H) 02/06/2020 1502    Physical Exam:    VS:  BP 130/86   Pulse 70   Ht 5' 0.98" (1.549 m)   Wt 218 lb 6.4 oz (99.1 kg)   SpO2 97%   BMI 41.29 kg/m     Wt Readings from Last 3 Encounters:  10/06/21 218 lb 6.4 oz (99.1 kg)  09/29/21 211 lb (95.7 kg)  02/14/21 217 lb (98.4 kg)     GEN: Overweight with phenotype for diastolic heart failure.. No acute distress HEENT: Normal NECK: No JVD. LYMPHATICS: No lymphadenopathy CARDIAC: 2/6 left mid sternal systolic murmur.  No diastolic murmurs heard.   RRR no gallop trace to 1+ bilateral ankle edema. VASCULAR:  Normal Pulses. No bruits. RESPIRATORY:  Clear to auscultation without rales, wheezing or rhonchi  ABDOMEN: Soft, non-tender, non-distended, No pulsatile mass, MUSCULOSKELETAL: No deformity  SKIN: Warm and dry NEUROLOGIC:  Alert and oriented x 3 PSYCHIATRIC:  Normal affect   ASSESSMENT:    1. Chronic diastolic heart failure (HCC)   2. Coronary artery calcification seen on CT scan   3. Essential hypertension   4. DOE (dyspnea on exertion)   5. Mitral valve disease   6. Mild pulmonary hypertension (HCC)   7. Hyperlipidemia, unspecified hyperlipidemia type    PLAN:    In order  of problems listed above:  Start Jardiance or Farxiga 10 mg p.o. daily.  Bmet in 2 weeks.  Follow-up with Caitlin/Dayna/Ayaka Andes Katrinka Blazing in 6 to 8 weeks to monitor for improvement.  We need to keep in mind that dyspnea could also be related to mitral stenosis.  She is already on beta-blocker therapy heart rate is 70.  We could  conceivably slightly increase the dose.  She is not currently on a diuretic.  We will see how she does on SGLT2.  Next step would be to consider adding MRA therapy. Continue fish oil, Zetia, and avoid statins because of prior intolerance. Blood pressure is adequate for age.  Target 140/80 mmHg 10 mmHg. Probably multifactorial related to the mitral valve and diastolic heart failure.  See dialogue under problem #1. See dialogue on the problem #1. Did not discuss Continue nonstatin management of hyperlipidemia.  Guideline directed therapy for left ventricular diastolic dysfunction: Entresto; mineralocorticoid receptor antagonist (MRA) therapy -spironolactone or eplerenone.  SGLT-2 agents -  Dapagliflozin Marcelline Deist) or Empagliflozin (Jardiance).These therapies have been shown to improve clinical outcomes including reduction of rehospitalization, survival, and acute heart failure.    Medication Adjustments/Labs and Tests Ordered: Current medicines are reviewed at length with the patient today.  Concerns regarding medicines are outlined above.  Orders Placed This Encounter  Procedures   Basic metabolic panel   Ambulatory referral to Cardiology   EKG 12-Lead   Meds ordered this encounter  Medications   dapagliflozin propanediol (FARXIGA) 10 MG TABS tablet    Sig: Take 1 tablet (10 mg total) by mouth daily before breakfast.    Dispense:  30 tablet    Refill:  11    Patient Instructions  Medication Instructions:  Your physician has recommended you make the following change in your medication:   1) dapagliflozin propanediol (Farxiga) 10mg  daily (in the morning, with breakfast)  *If you need a refill on your cardiac medications before your next appointment, please call your pharmacy*  Lab Work: In 2 weeks: BMET If you have labs (blood work) drawn today and your tests are completely normal, you will receive your results only by: MyChart Message (if you have MyChart) OR A paper copy in the  mail If you have any lab test that is abnormal or we need to change your treatment, we will call you to review the results.  Testing/Procedures: NONE  Follow-Up: At [facility], you and your health needs are our priority.  As part of our continuing mission to provide you with exceptional heart care, we have created designated Provider Care Teams.  These Care Teams include your primary Cardiologist (physician) and Advanced Practice Providers (APPs -  Physician Assistants and Nurse Practitioners) who all work together to provide you with the care you need, when you need it.  Your next appointment:   6-8 week(s)  The format for your next appointment:   In Person  Provider:   Lesleigh Noe, MD  or Ronie Spies, PA-C or Gaynelle Adu, NP     Other Instructions You have been referred to follow-up with one of our sleep specialists, Dr. Armanda Magic. Our office will call you to schedule your appointment with her.  Important Information About Sugar         Signed, Lesleigh Noe, MD  10/06/2021 1:18 PM    [facility]

## 2021-10-06 ENCOUNTER — Ambulatory Visit: Payer: Medicare Other | Attending: Interventional Cardiology | Admitting: Interventional Cardiology

## 2021-10-06 ENCOUNTER — Encounter: Payer: Self-pay | Admitting: Interventional Cardiology

## 2021-10-06 ENCOUNTER — Telehealth: Payer: Self-pay | Admitting: Interventional Cardiology

## 2021-10-06 VITALS — BP 130/86 | HR 70 | Ht 60.98 in | Wt 218.4 lb

## 2021-10-06 DIAGNOSIS — R0609 Other forms of dyspnea: Secondary | ICD-10-CM | POA: Insufficient documentation

## 2021-10-06 DIAGNOSIS — E785 Hyperlipidemia, unspecified: Secondary | ICD-10-CM | POA: Insufficient documentation

## 2021-10-06 DIAGNOSIS — I2510 Atherosclerotic heart disease of native coronary artery without angina pectoris: Secondary | ICD-10-CM | POA: Insufficient documentation

## 2021-10-06 DIAGNOSIS — I10 Essential (primary) hypertension: Secondary | ICD-10-CM | POA: Insufficient documentation

## 2021-10-06 DIAGNOSIS — I272 Pulmonary hypertension, unspecified: Secondary | ICD-10-CM | POA: Diagnosis present

## 2021-10-06 DIAGNOSIS — I5032 Chronic diastolic (congestive) heart failure: Secondary | ICD-10-CM | POA: Insufficient documentation

## 2021-10-06 DIAGNOSIS — I059 Rheumatic mitral valve disease, unspecified: Secondary | ICD-10-CM | POA: Insufficient documentation

## 2021-10-06 MED ORDER — DAPAGLIFLOZIN PROPANEDIOL 10 MG PO TABS: 10.0000 mg | ORAL_TABLET | Freq: Every day | ORAL | 11 refills | Status: DC

## 2021-10-06 NOTE — Telephone Encounter (Signed)
Spoke with pt and Wilder Glade does not to be on pt's allergy list and is not in same family as other allergies listed per orders pt will take as directed /cy

## 2021-10-06 NOTE — Progress Notes (Signed)
Spoke to pt lab report mailed, and pt states recvd antibiotic thru mail

## 2021-10-06 NOTE — Patient Instructions (Addendum)
Medication Instructions:  Your physician has recommended you make the following change in your medication:   1) dapagliflozin propanediol (Farxiga) '10mg'$  daily (in the morning, with breakfast)  *If you need a refill on your cardiac medications before your next appointment, please call your pharmacy*  Lab Work: In 2 weeks: BMET If you have labs (blood work) drawn today and your tests are completely normal, you will receive your results only by: [place] (if you have MyChart) OR A paper copy in the mail If you have any lab test that is abnormal or we need to change your treatment, we will call you to review the results.  Testing/Procedures: NONE  Follow-Up: At [facility], you and your health needs are our priority.  As part of our continuing mission to provide you with exceptional heart care, we have created designated Provider Care Teams.  These Care Teams include your primary Cardiologist (physician) and Advanced Practice Providers (APPs -  Physician Assistants and Nurse Practitioners) who all work together to provide you with the care you need, when you need it.  Your next appointment:   6-8 week(s)  The format for your next appointment:   In Person  Provider:   Sinclair Grooms, MD  or Melina Copa, PA-C or Owens Shark, NP     Other Instructions You have been referred to follow-up with one of our sleep specialists, Dr. Fransico Him. Our office will call you to schedule your appointment with her.  Important Information About Sugar

## 2021-10-06 NOTE — Telephone Encounter (Signed)
Pt c/o medication issue:  1. Name of Medication: dapagliflozin propanediol (FARXIGA) 10 MG TABS tablet  2. How are you currently taking this medication (dosage and times per day)?   3. Are you having a reaction (difficulty breathing--STAT)?   4. What is your medication issue?  Pt would like a call back to make sure this medication is not the same or similar to a medication she has had reactions to in the past. Please advise.

## 2021-10-06 NOTE — Telephone Encounter (Signed)
Copied from [place] [id number]. Topic: General - Other >> Oct 03, 2021 11:54 AM Chapman Fitch wrote: Reason for CRM: pt would like her lab results mailed to her / please advise  Spoke to pt lab results being mailed

## 2021-10-13 ENCOUNTER — Ambulatory Visit (INDEPENDENT_AMBULATORY_CARE_PROVIDER_SITE_OTHER): Payer: Medicare Other

## 2021-10-13 ENCOUNTER — Encounter: Payer: Self-pay | Admitting: Physician Assistant

## 2021-10-13 ENCOUNTER — Ambulatory Visit: Payer: Medicare Other | Attending: Physician Assistant | Admitting: Physician Assistant

## 2021-10-13 VITALS — BP 154/80 | HR 73 | Resp 15 | Ht 60.0 in | Wt 218.6 lb

## 2021-10-13 DIAGNOSIS — Z96652 Presence of left artificial knee joint: Secondary | ICD-10-CM | POA: Insufficient documentation

## 2021-10-13 DIAGNOSIS — Z86010 Personal history of colonic polyps: Secondary | ICD-10-CM | POA: Insufficient documentation

## 2021-10-13 DIAGNOSIS — M65322 Trigger finger, left index finger: Secondary | ICD-10-CM | POA: Diagnosis present

## 2021-10-13 DIAGNOSIS — G5602 Carpal tunnel syndrome, left upper limb: Secondary | ICD-10-CM | POA: Diagnosis present

## 2021-10-13 DIAGNOSIS — M7542 Impingement syndrome of left shoulder: Secondary | ICD-10-CM | POA: Diagnosis present

## 2021-10-13 DIAGNOSIS — R7303 Prediabetes: Secondary | ICD-10-CM | POA: Diagnosis present

## 2021-10-13 DIAGNOSIS — M8589 Other specified disorders of bone density and structure, multiple sites: Secondary | ICD-10-CM | POA: Insufficient documentation

## 2021-10-13 DIAGNOSIS — I10 Essential (primary) hypertension: Secondary | ICD-10-CM | POA: Insufficient documentation

## 2021-10-13 DIAGNOSIS — Z800 Family history of malignant neoplasm of digestive organs: Secondary | ICD-10-CM | POA: Diagnosis present

## 2021-10-13 DIAGNOSIS — E7800 Pure hypercholesterolemia, unspecified: Secondary | ICD-10-CM | POA: Diagnosis present

## 2021-10-13 DIAGNOSIS — Z85828 Personal history of other malignant neoplasm of skin: Secondary | ICD-10-CM | POA: Diagnosis present

## 2021-10-13 DIAGNOSIS — E559 Vitamin D deficiency, unspecified: Secondary | ICD-10-CM | POA: Insufficient documentation

## 2021-10-13 DIAGNOSIS — G4733 Obstructive sleep apnea (adult) (pediatric): Secondary | ICD-10-CM | POA: Insufficient documentation

## 2021-10-13 DIAGNOSIS — M79672 Pain in left foot: Secondary | ICD-10-CM

## 2021-10-13 DIAGNOSIS — F321 Major depressive disorder, single episode, moderate: Secondary | ICD-10-CM | POA: Diagnosis present

## 2021-10-13 DIAGNOSIS — M79671 Pain in right foot: Secondary | ICD-10-CM

## 2021-10-13 DIAGNOSIS — L821 Other seborrheic keratosis: Secondary | ICD-10-CM | POA: Insufficient documentation

## 2021-10-13 DIAGNOSIS — K219 Gastro-esophageal reflux disease without esophagitis: Secondary | ICD-10-CM | POA: Insufficient documentation

## 2021-10-13 DIAGNOSIS — I341 Nonrheumatic mitral (valve) prolapse: Secondary | ICD-10-CM | POA: Diagnosis present

## 2021-10-13 DIAGNOSIS — G629 Polyneuropathy, unspecified: Secondary | ICD-10-CM | POA: Diagnosis present

## 2021-10-13 DIAGNOSIS — M1711 Unilateral primary osteoarthritis, right knee: Secondary | ICD-10-CM | POA: Diagnosis present

## 2021-10-13 NOTE — Patient Instructions (Signed)
Osteopenia  Osteopenia is a loss of thickness (density) inside the bones. Another name for osteopenia is low bone mass. Mild osteopenia is a normal part of aging. It is not a disease, and it does not cause symptoms. However, if you have osteopenia and continue to lose bone mass, you could develop a condition that causes the bones to become thin and break more easily (osteoporosis). Osteoporosis can cause you to lose some height, have back pain, and have a stooped posture. Although osteopenia is not a disease, making changes to your lifestyle and diet can help to prevent osteopenia from developing into osteoporosis. What are the causes? Osteopenia is caused by loss of calcium in the bones. Bones are constantly changing. Old bone cells are continually being replaced with new bone cells. This process builds new bone. The mineral calcium is needed to build new bone and maintain bone density. Bone density is usually highest around age 35. After that, most people's bodies cannot replace all the bone they have lost with new bone. What increases the risk? You are more likely to develop this condition if: You are older than age 50. You are a woman who went through menopause early. You have a long illness that keeps you in bed. You do not get enough exercise. You lack certain nutrients (malnutrition). You have an overactive thyroid gland (hyperthyroidism). You use products that contain nicotine or tobacco, such as cigarettes, e-cigarettes and chewing tobacco, or you drink a lot of alcohol. You are taking medicines that weaken the bones, such as steroids. What are the signs or symptoms? This condition does not cause any symptoms. You may have a slightly higher risk for bone breaks (fractures), so getting fractures more easily than normal may be an indication of osteopenia. How is this diagnosed? This condition may be diagnosed based on an X-ray exam that measures bone density (dual-energy X-ray  absorptiometry, or DEXA). This test can measure bone density in your hips, spine, and wrists. Osteopenia has no symptoms, so this condition is usually diagnosed after a routine bone density screening test is done for osteoporosis. This routine screening is usually done for: Women who are age 65 or older. Men who are age 70 or older. If you have risk factors for osteopenia, you may have the screening test at an earlier age. How is this treated? Making dietary and lifestyle changes can lower your risk for osteoporosis. If you have severe osteopenia that is close to becoming osteoporosis, this condition can be treated with medicines and dietary supplements such as calcium and vitamin D. These supplements help to rebuild bone density. Follow these instructions at home: Eating and drinking Eat a diet that is high in calcium and vitamin D. Calcium is found in dairy products, beans, salmon, and leafy green vegetables like spinach and broccoli. Look for foods that have vitamin D and calcium added to them (fortified foods), such as orange juice, cereal, and bread.  Lifestyle Do 30 minutes or more of a weight-bearing exercise every day, such as walking, jogging, or playing a sport. These types of exercises strengthen the bones. Do not use any products that contain nicotine or tobacco, such as cigarettes, e-cigarettes, and chewing tobacco. If you need help quitting, ask your health care provider. Do not drink alcohol if: Your health care provider tells you not to drink. You are pregnant, may be pregnant, or are planning to become pregnant. If you drink alcohol: Limit how much you use to: 0-1 drink a day for women. 0-2   drinks a day for men. Be aware of how much alcohol is in your drink. In the [place] one drink equals one 12 oz bottle of beer (355 mL), one 5 oz glass of wine (148 mL), or one 1 oz glass of hard liquor (44 mL). General instructions Take over-the-counter and prescription medicines only as  told by your health care provider. These include vitamins and supplements. Take precautions at home to lower your risk of falling, such as: Keeping rooms well-lit and free of clutter, such as cords. Installing safety rails on stairs. Using rubber mats in the bathroom or other areas that are often wet or slippery. Keep all follow-up visits. This is important. Contact a health care provider if: You have not had a bone density screening for osteoporosis and you are: A woman who is age 65 or older. A man who is age 70 or older. You are a postmenopausal woman who has not had a bone density screening for osteoporosis. You are older than age 50 and you want to know if you should have bone density screening for osteoporosis. Summary Osteopenia is a loss of thickness (density) inside the bones. Another name for osteopenia is low bone mass. Osteopenia is not a disease, but it may increase your risk for a condition that causes the bones to become thin and break more easily (osteoporosis). You may be at risk for osteopenia if you are older than age 50 or if you are a woman who went through early menopause. Osteopenia does not cause any symptoms, but it can be diagnosed with a bone density screening test. Dietary and lifestyle changes are the first treatment for osteopenia. These may lower your risk for osteoporosis. This information is not intended to replace advice given to you by your health care provider. Make sure you discuss any questions you have with your health care provider. Document Revised: 06/08/2019 Document Reviewed: 06/08/2019 Elsevier Patient Education  2023 Elsevier Inc.  

## 2021-10-14 NOTE — Progress Notes (Signed)
X-rays of both feet are consistent with osteoarthritis of both feet.  Possible inflammatory arthritis changes noted.   Lab work is pending.

## 2021-10-14 NOTE — Progress Notes (Signed)
Vitamin D is WNL.  Ok to continue current maintenance dose of vitamin D.   ESR and CRP WNL.  RF negative.

## 2021-10-16 LAB — C-REACTIVE PROTEIN: CRP: 4.1 mg/L (ref <8.0)

## 2021-10-16 LAB — PROTEIN ELECTROPHORESIS, SERUM, WITH REFLEX
Albumin ELP: 4.2 g/dL (ref 3.8–4.8)
Alpha 1: 0.3 g/dL (ref 0.2–0.3)
Alpha 2: 0.7 g/dL (ref 0.5–0.9)
Beta 2: 0.4 g/dL (ref 0.2–0.5)
Beta Globulin: 0.5 g/dL (ref 0.4–0.6)
Gamma Globulin: 0.8 g/dL (ref 0.8–1.7)
Total Protein: 7 g/dL (ref 6.1–8.1)

## 2021-10-16 LAB — SEDIMENTATION RATE: Sed Rate: 17 mm/h (ref 0–30)

## 2021-10-16 LAB — PARATHYROID HORMONE, INTACT (NO CA): PTH: 68 pg/mL (ref 16–77)

## 2021-10-16 LAB — CYCLIC CITRUL PEPTIDE ANTIBODY, IGG: Cyclic Citrullin Peptide Ab: <16 UNITS

## 2021-10-16 LAB — RHEUMATOID FACTOR: Rheumatoid fact SerPl-aCnc: <14 IU/mL (ref <14)

## 2021-10-16 LAB — VITAMIN D 25 HYDROXY (VIT D DEFICIENCY, FRACTURES): Vit D, 25-Hydroxy: 86 ng/mL (ref 30–100)

## 2021-10-16 NOTE — Progress Notes (Signed)
SPEP did not reveal any abnormal protein bands.  Anti-CCP negative.  PTH WNL.

## 2021-10-20 ENCOUNTER — Ambulatory Visit: Payer: Medicare Other | Attending: Interventional Cardiology

## 2021-10-20 DIAGNOSIS — I10 Essential (primary) hypertension: Secondary | ICD-10-CM

## 2021-10-21 LAB — BASIC METABOLIC PANEL
BUN/Creatinine Ratio: 14 (ref 12–28)
BUN: 11 mg/dL (ref 8–27)
CO2: 25 mmol/L (ref 20–29)
Calcium: 9.9 mg/dL (ref 8.7–10.3)
Chloride: 97 mmol/L (ref 96–106)
Creatinine, Ser: 0.78 mg/dL (ref 0.57–1.00)
Glucose: 90 mg/dL (ref 70–99)
Potassium: 4.7 mmol/L (ref 3.5–5.2)
Sodium: 137 mmol/L (ref 134–144)
eGFR: 73 mL/min/{1.73_m2} (ref >59)

## 2021-10-22 ENCOUNTER — Telehealth: Payer: Self-pay

## 2021-10-22 NOTE — Telephone Encounter (Signed)
Att to contact pt to confirm if influenza vaccine has been completed. No ans lvm

## 2021-10-27 NOTE — Progress Notes (Signed)
Office Visit Note  Patient: Sydney Booth             Date of Birth: 05/10/1931           MRN: [id number]             PCP: Camillia Herter, NP Referring: Camillia Herter, NP Visit Date: 11/10/2021 Occupation: @GUAROCC @  Subjective:  NPFU   History of Present Illness: Sydney Booth is a 88 y.o. female with history of osteopenia and osteoarthritis.  Patient presents today to discuss x-ray results and lab results from her initial office visit on 10/13/21.  She continues to have numbness and tingling in both feet.  She also notices swelling in her ankles and is unsure if it is due to joint swelling or due to fluid retention.  She continues to use a cane to assist with ambulation.  She denies any recent falls.  She continues to take a calcium and vitamin D supplement daily.  She denies any other new or worsening symptoms since her initial office visit.   Activities of Daily Living:  Patient reports morning stiffness for 0 minutes.   Patient Reports nocturnal pain.  Difficulty dressing/grooming: Denies Difficulty climbing stairs: Denies Difficulty getting out of chair: Denies Difficulty using hands for taps, buttons, cutlery, and/or writing: Reports  Review of Systems  Constitutional:  Negative for fatigue.  HENT:  Negative for mouth sores and mouth dryness.   Eyes:  Negative for dryness.  Respiratory:  Positive for shortness of breath.   Cardiovascular:  Negative for chest pain and palpitations.  Gastrointestinal:  Positive for constipation. Negative for blood in stool and diarrhea.  Endocrine: Negative for increased urination.  Genitourinary:  Negative for involuntary urination.  Musculoskeletal:  Positive for joint pain, joint pain, joint swelling, myalgias, muscle tenderness and myalgias. Negative for gait problem, muscle weakness and morning stiffness.  Skin:  Positive for color change. Negative for rash, hair loss and sensitivity to sunlight.  Allergic/Immunologic: Negative  for susceptible to infections.  Neurological:  Positive for dizziness and numbness. Negative for headaches.  Hematological:  Negative for swollen glands.  Psychiatric/Behavioral:  Positive for sleep disturbance. Negative for depressed mood. The patient is not nervous/anxious.     PMFS History:  Patient Active Problem List   Diagnosis Date Noted   Prediabetes 09/30/2021   History of adenomatous polyp of colon 12/16/2020   Anxiety 12/16/2020   Decreased estrogen level 12/16/2020   Family history of colon cancer 12/16/2020   Major depression 12/16/2020   Moderate major depression, single episode ([place]) 12/16/2020   Nightmares 12/16/2020   Pure hypercholesterolemia 12/16/2020   History of skin cancer 08/21/2020   Milia 08/21/2020   Neoplasm of uncertain behavior of skin 08/21/2020   Purpura ([place]) 08/21/2020   Seborrheic keratosis 08/21/2020   Abnormal CXR 04/01/2020   Carpal tunnel syndrome of left wrist 06/08/2018   Polyneuropathy    Syncope    Trigger finger, left index finger 06/04/2016   Impingement syndrome, shoulder, left 10/31/2015   Balance problem 04/11/2015   Neck pain 04/11/2015   Chronic pain of both shoulders 12/20/2014   Status post total left knee replacement 12/20/2014   Benign paroxysmal positional vertigo 09/12/2014   Neuropathic pain of foot 09/12/2014   Rotator cuff syndrome 06/28/2014   Dysfunction of both eustachian tubes 05/30/2014   Blurred vision 11/24/2013   Vitamin D deficiency 03/08/2013   Obstructive sleep apnea syndrome 02/14/2013   Hyperlipidemia 02/09/2013   Arthritis of knee  03/24/2012   Presence of unspecified artificial knee joint 03/24/2012   Dyslipidemia 03/02/2012   Gastroesophageal reflux disease without esophagitis 03/02/2012   Mitral valve prolapse 03/02/2012   Morbid obesity ([place]) 01/27/2012   Primary osteoarthritis of right knee 03/24/2011    Class: Chronic   Hypertension 03/24/2011    Class: Chronic    Past Medical History:   Diagnosis Date   Arthritis    Blood transfusion    Cervical vertebral fracture (HCC)    fued on its on   Closed head injury    as a chlid   Coronary artery calcification seen on CT scan    Fall 12/22/2017   right eye bruised   Gastric ulcer    GERD (gastroesophageal reflux disease)    H/O hiatal hernia    Headache(784.0)    Hyperlipemia    Hypertension    Left carpal tunnel syndrome 06/08/2018   Mild pulmonary hypertension (HCC)    Mitral regurgitation    Mitral stenosis    Mitral valve prolapse    Obesity    OSA on CPAP    Osteopenia    Rotator cuff tear    Left   Seasonal allergies    Shortness of breath    With activity   Ulcer 1991   Bledding     Family History  Problem Relation Age of Onset   Stroke Mother    Cancer Father        COLON, LIVER AND PANCREAS   Heart disease Brother    Sleep apnea Brother    Sleep apnea Brother    Pulmonary embolism Brother    ALS Son    Tourette syndrome Son    Healthy Daughter    Autism Grandson    Anesthesia problems Neg Hx    Breast cancer Neg Hx    Past Surgical History:  Procedure Laterality Date   BIospy      Biospy      Uterus wall   [place] Tunnel     CATARACT EXTRACTION     Colonscopy     EYE SURGERY     Right 2011, Left 2008   LIPOMA EXCISION     FROM BACK    THUMB FUSION     TOTAL KNEE ARTHROPLASTY  03/24/2011   Procedure: TOTAL KNEE ARTHROPLASTY;  Surgeon: Hessie Dibble, MD;  Location: [facility];  Service: Orthopedics;  Laterality: Left;  with revision tibial component   TUBAL LIGATION     Social History   Social History Narrative   Patient lives at home with daughter   Patient is retired   Patient is widowed   Patient has a high school education   Patient has 5 children    Immunization History  Administered Date(s) Administered   Influenza Split 10/10/2008, 10/10/2009, 11/16/2010   PFIZER(Purple Top)SARS-COV-2 Vaccination 03/31/2019, 04/21/2019, 12/15/2019, 06/19/2020   Pneumococcal  Conjugate-13 11/17/2013   Pneumococcal Polysaccharide-23 12/11/1998, 01/06/2007, 06/17/2016   Td 12/11/2007   Tdap 05/18/2012, 12/22/2017, 06/19/2020   Zoster Recombinat (Shingrix) 08/21/2020   Zoster, Live 05/18/2012, 06/15/2019     Objective: Vital Signs: BP (!) 177/96 (BP Location: Left Arm, Patient Position: Sitting, Cuff Size: Normal)   Pulse 69   Resp 16   Ht 5' (1.524 m)   Wt 215 lb (97.5 kg)   BMI 41.99 kg/m    Physical Exam Vitals and nursing note reviewed.  Constitutional:      Appearance: She is well-developed.  HENT:     Head:  Normocephalic and atraumatic.  Eyes:     Conjunctiva/sclera: Conjunctivae normal.  Cardiovascular:     Rate and Rhythm: Normal rate and regular rhythm.     Heart sounds: Normal heart sounds.  Pulmonary:     Effort: Pulmonary effort is normal.     Breath sounds: Normal breath sounds.  Abdominal:     General: Bowel sounds are normal.     Palpations: Abdomen is soft.  Musculoskeletal:     Cervical back: Normal range of motion.  Skin:    General: Skin is warm and dry.     Capillary Refill: Capillary refill takes less than 2 seconds.  Neurological:     Mental Status: She is alert and oriented to person, place, and time.  Psychiatric:        Behavior: Behavior normal.      Musculoskeletal Exam: Patient remained seated during the examination today.  C-spine has good range of motion with no discomfort.  Postural thoracic kyphosis noted.  No midline spinal tenderness or SI joint tenderness currently.  Shoulder joints have slightly limited range of motion with some stiffness bilaterally.  Elbow joints, wrist joints, MCPs, PIPs, DIPs have good range of motion with no synovitis.  PIP and DIP thickening consistent with osteoarthritis of both hands.  Right CMC joint prominence noted.  Hip joints difficult to assess in seated position.  Left knee replacement has good range of motion.  Right knee joint has good range of motion with some discomfort but  no warmth or effusion noted.  Ankle joints have good range of motion with no tenderness or synovitis.  No tenderness or synovitis over MTP joints.  Bunions noted bilaterally.  Some PIP and DIP thickening consistent with osteoarthritis of both feet.  No evidence of Achilles tendinitis or plantar fasciitis.  Pedal edema noted bilaterally.  CDAI Exam: CDAI Score: -- Patient Global: --; Provider Global: -- Swollen: --; Tender: -- Joint Exam 11/10/2021   No joint exam has been documented for this visit   There is currently no information documented on the homunculus. Go to the Rheumatology activity and complete the homunculus joint exam.  Investigation: No additional findings.  Imaging: XR Foot 2 Views Right  Result Date: 10/14/2021 First MTP narrowing was noted.  Postsurgical changes noted in the fifth metatarsal head.  PIP and DIP narrowing was noted.  No intertarsal joint space narrowing was noted.  No tibiotalar narrowing was noted.  Subtalar narrowing was noted.  Inferior spurring was noted. Impression: These findings are consistent with osteoarthritis and possible inflammatory arthritis of the foot.  XR Foot 2 Views Left  Result Date: 10/14/2021 First MTP narrowing and spurring was noted.  PIP and DIP narrowing was noted.  Intertarsal narrowing was noted.  Dorsal spurring was noted.  Tibiotalar joint space narrowing was noted.  Subtalar narrowing was noted.  Inferior calcaneal spur was noted. Impression: These findings are consistent with osteoarthritis and possible inflammatory arthritis of the foot.   Recent Labs: Lab Results  Component Value Date   WBC 4.2 09/29/2021   HGB 13.1 09/29/2021   PLT 286 09/29/2021   NA 137 10/20/2021   K 4.7 10/20/2021   CL 97 10/20/2021   CO2 25 10/20/2021   GLUCOSE 90 10/20/2021   BUN 11 10/20/2021   CREATININE 0.78 10/20/2021   BILITOT 0.5 09/29/2021   ALKPHOS 124 (H) 09/29/2021   AST 21 09/29/2021   ALT 10 09/29/2021   PROT 7.0  10/13/2021   ALBUMIN 4.1 09/29/2021  CALCIUM 9.9 10/20/2021   GFRAA 80 02/06/2020    Speciality Comments: No specialty comments available.  Procedures:  No procedures performed Allergies: Cymbalta [duloxetine hcl], Gabapentin, Statins, Sulfa antibiotics, and Zocor [simvastatin]    Assessment / Plan:     Visit Diagnoses: Osteopenia of multiple sites: DEXA 07/10/20: The BMD measured at Forearm Radius 33% is 0.678 g/cm2 with a T-scoreof -2.4.  No history of hip or vertebral fractures.No recent falls or fractures.  Using a cane to assist with ambulation.   She has had cortisone injections in the past but no systemic long-term prednisone use. She is taking a calcium and vitamin D supplement daily.  Vitamin D was 86 on 10/13/2021.   Due to update DEXA in July 2024.  Vitamin D deficiency: Vitamin D 86 on 10/13/21. She is taking a vitamin D supplement daily.   Impingement syndrome, shoulder, left: She has intermittent discomfort and stiffness in both shoulders.  Followed by Dr. Rip Harbour at [facility].   Primary osteoarthritis of right knee: She has good ROM of the right knee joint with mild discomfort.  No warmth or effusion noted.   Status post total left knee replacement: Performed by Dr. Latanya Maudlin. Doing well.  Good ROM with no discomfort.  Using cane to assist with ambulation.   Primary osteoarthritis of both feet -She presents today with ongoing pain and numbness in both feet.  X-rays of both feet were consistent with osteoarthritis and possible inflammatory arthritis on 10/13/2021.  ESR and CRP were within normal limits.  RF and anti-CCP were negative.  Discussed results today in detail and all questions were addressed.  She has no tenderness or synovitis over MTP joints.  Both ankle joints have good ROM with no discomfort or inflammation. Discussed that if she develops increased joint pain or notices any inflammation I would recommend proceeding with an ultrasound of both feet to assess  for synovitis.  She will notify us if she develops any new or worsening symptoms.  A referral to neurology will be placed today to have an NCV with EMG for further evaluation of the numbness and tingling she experiences in her feet.  She will follow-up in the office in 3 months or sooner if needed.  Paresthesia of both feet: Patient continues to experience chronic paresthesias and pain in both feet.  Her symptoms raise the concern for neuropathy.  On examination she has no joint tenderness or synovitis consistent with inflammatory arthritis at this time.  She does have PIP and DIP thickening along with first MTP joint thickening consistent with osteoarthritic changes.  X-rays of both feet were consistent with osteoarthritis and possible inflammatory arthritis.  Sed rate and CRP were within normal limits.  RF and anti-CCP were negative.  Discussed proceeding with an ultrasound of both feet to assess for synovitis but she would like to first figure out why she is experiencing paresthesias in her feet.  Referral to neurology for NCV with EMG will be placed today for further evaluation.  She will notify us if she develops increased joint pain or joint swelling at which time she will return for an ultrasound of both feet to assess for synovitis.  Trigger finger, left index finger: Intermittent locking.    Carpal tunnel syndrome of left wrist: Asymptomatic at this time.   Polyneuropathy: Referral to neurology will be placed today.   Other medical conditions are listed as follows:   Gastroesophageal reflux disease without esophagitis  Obstructive sleep apnea syndrome  Primary hypertension: Blood pressure  was elevated today in the office at 177/96.  Her blood pressure was rechecked at home this morning and she called [name] back with the reading: 116/63.  Seborrheic keratosis  Mitral valve prolapse  Pure hypercholesterolemia  Prediabetes  Moderate major depression, single episode ([place])  History of  skin cancer  Family history of colon cancer  Orders: No orders of the defined types were placed in this encounter.  No orders of the defined types were placed in this encounter.     Follow-Up Instructions: Return in about 3 months (around 02/10/2022).   Ofilia Neas, PA-C  Note - This record has been created using Dragon software.  Chart creation errors have been sought, but may not always  have been located. Such creation errors do not reflect on  the standard of medical care.

## 2021-10-28 ENCOUNTER — Other Ambulatory Visit: Payer: Self-pay | Admitting: Family Medicine

## 2021-10-29 ENCOUNTER — Other Ambulatory Visit: Payer: Self-pay | Admitting: Family Medicine

## 2021-10-29 ENCOUNTER — Ambulatory Visit: Payer: Self-pay | Admitting: *Deleted

## 2021-10-29 DIAGNOSIS — M545 Low back pain, unspecified: Secondary | ICD-10-CM

## 2021-10-29 DIAGNOSIS — M5416 Radiculopathy, lumbar region: Secondary | ICD-10-CM

## 2021-10-29 NOTE — Telephone Encounter (Signed)
Spoke to pt advised not sure why Lipid panel was not collected, but at her convenience she can stop by and doing fasting lab to get updated cholesterol levels

## 2021-10-29 NOTE — Telephone Encounter (Signed)
Patient called to check on lab results for cholesterol values. Last physical with PCP in review of chart 09/29/21. No lipid panel noted in results. Patient would like to know during annual exam why cholesterol was not checked.  Patient would like a call back to discuss checking cholesterol. Please advise .

## 2021-11-10 ENCOUNTER — Encounter: Payer: Self-pay | Admitting: Physician Assistant

## 2021-11-10 ENCOUNTER — Ambulatory Visit: Payer: Medicare Other | Attending: Physician Assistant | Admitting: Physician Assistant

## 2021-11-10 ENCOUNTER — Telehealth: Payer: Self-pay | Admitting: Rheumatology

## 2021-11-10 ENCOUNTER — Other Ambulatory Visit: Payer: Self-pay | Admitting: Family

## 2021-11-10 VITALS — BP 177/96 | HR 69 | Resp 16 | Ht 60.0 in | Wt 215.0 lb

## 2021-11-10 DIAGNOSIS — M65322 Trigger finger, left index finger: Secondary | ICD-10-CM | POA: Diagnosis present

## 2021-11-10 DIAGNOSIS — K219 Gastro-esophageal reflux disease without esophagitis: Secondary | ICD-10-CM

## 2021-11-10 DIAGNOSIS — E7800 Pure hypercholesterolemia, unspecified: Secondary | ICD-10-CM

## 2021-11-10 DIAGNOSIS — M8589 Other specified disorders of bone density and structure, multiple sites: Secondary | ICD-10-CM

## 2021-11-10 DIAGNOSIS — G629 Polyneuropathy, unspecified: Secondary | ICD-10-CM | POA: Diagnosis present

## 2021-11-10 DIAGNOSIS — G4733 Obstructive sleep apnea (adult) (pediatric): Secondary | ICD-10-CM | POA: Diagnosis present

## 2021-11-10 DIAGNOSIS — Z85828 Personal history of other malignant neoplasm of skin: Secondary | ICD-10-CM

## 2021-11-10 DIAGNOSIS — G5602 Carpal tunnel syndrome, left upper limb: Secondary | ICD-10-CM | POA: Diagnosis present

## 2021-11-10 DIAGNOSIS — R202 Paresthesia of skin: Secondary | ICD-10-CM

## 2021-11-10 DIAGNOSIS — F321 Major depressive disorder, single episode, moderate: Secondary | ICD-10-CM | POA: Diagnosis present

## 2021-11-10 DIAGNOSIS — M1711 Unilateral primary osteoarthritis, right knee: Secondary | ICD-10-CM | POA: Diagnosis present

## 2021-11-10 DIAGNOSIS — M7542 Impingement syndrome of left shoulder: Secondary | ICD-10-CM | POA: Diagnosis present

## 2021-11-10 DIAGNOSIS — L821 Other seborrheic keratosis: Secondary | ICD-10-CM | POA: Diagnosis present

## 2021-11-10 DIAGNOSIS — Z96652 Presence of left artificial knee joint: Secondary | ICD-10-CM | POA: Diagnosis present

## 2021-11-10 DIAGNOSIS — R7303 Prediabetes: Secondary | ICD-10-CM

## 2021-11-10 DIAGNOSIS — Z800 Family history of malignant neoplasm of digestive organs: Secondary | ICD-10-CM | POA: Diagnosis present

## 2021-11-10 DIAGNOSIS — M19071 Primary osteoarthritis, right ankle and foot: Secondary | ICD-10-CM

## 2021-11-10 DIAGNOSIS — E782 Mixed hyperlipidemia: Secondary | ICD-10-CM

## 2021-11-10 DIAGNOSIS — E559 Vitamin D deficiency, unspecified: Secondary | ICD-10-CM | POA: Diagnosis present

## 2021-11-10 DIAGNOSIS — I10 Essential (primary) hypertension: Secondary | ICD-10-CM | POA: Diagnosis present

## 2021-11-10 DIAGNOSIS — I341 Nonrheumatic mitral (valve) prolapse: Secondary | ICD-10-CM

## 2021-11-10 DIAGNOSIS — M19072 Primary osteoarthritis, left ankle and foot: Secondary | ICD-10-CM

## 2021-11-10 NOTE — Telephone Encounter (Signed)
Unable to refill per protocol, Rx request is too soon.Last refill 01/08/21 for 90 and 3 RF. Will refuse.  Requested Prescriptions  Pending Prescriptions Disp Refills   metoprolol tartrate (LOPRESSOR) 25 MG tablet [Pharmacy Med Name: Metoprolol Tartrate 25 MG Oral Tablet] 180 tablet 3    Sig: TAKE 1 TABLET BY MOUTH TWICE  DAILY     Cardiovascular:  Beta Blockers Failed - 11/10/2021  4:15 AM      Failed - Last BP in normal range    BP Readings from Last 1 Encounters:  11/10/21 (!) 177/96         Passed - Last Heart Rate in normal range    Pulse Readings from Last 1 Encounters:  11/10/21 69         Passed - Valid encounter within last 6 months    Recent Outpatient Visits           1 month ago Annual physical exam   Primary Care at [facility], Amy J, NP   8 months ago Essential hypertension   Primary Care at [facility], Amy J, NP   10 months ago Screening for diabetes mellitus (DM)   Primary Care at [facility], [place], NP   1 year ago Essential hypertension   Primary Care at [facility], Amy J, NP   1 year ago Primary hypertension   Primary Care at [facility], Bayard Beaver, MD       Future Appointments             In 3 weeks Miguel Aschoff [place]. [facility], [facility]   In 3 weeks Turner, Eber Hong, MD [facility] [address] A Dept Of [place]. [facility], [facility]   In 3 months Q[place]a Skye, Blinda Leatherwood [facility] A Dept Of [place]. [facility]             ezetimibe (ZETIA) 10 MG tablet [Pharmacy Med Name: Ezetimibe 10 MG Oral Tablet] 90 tablet 3    Sig: TAKE 1 TABLET BY MOUTH DAILY     Cardiovascular:  Antilipid - Sterol Transport Inhibitors Failed - 11/10/2021  4:15 AM      Failed - Lipid Panel in normal range within the last 12 months    Cholesterol, Total  Date Value Ref Range Status  02/06/2020 187 100 - 199  mg/dL Final   LDL Chol Calc (NIH)  Date Value Ref Range Status  02/06/2020 101 (H) 0 - 99 mg/dL Final   HDL  Date Value Ref Range Status  02/06/2020 67 >39 mg/dL Final   Triglycerides  Date Value Ref Range Status  02/06/2020 108 0 - 149 mg/dL Final         Passed - AST in normal range and within 360 days    AST  Date Value Ref Range Status  09/29/2021 21 0 - 40 IU/L Final         Passed - ALT in normal range and within 360 days    ALT  Date Value Ref Range Status  09/29/2021 10 0 - 32 IU/L Final         Passed - Patient is not pregnant      Passed - Valid encounter within last 12 months    Recent Outpatient Visits           1 month ago Annual physical exam  Primary Care at [facility], Amy J, NP   8 months ago Essential hypertension   Primary Care at [facility], Amy J, NP   10 months ago Screening for diabetes mellitus (DM)   Primary Care at [facility], [place], NP   1 year ago Essential hypertension   Primary Care at [facility], [place], NP   1 year ago Primary hypertension   Primary Care at [facility], Bayard Beaver, MD       Future Appointments             In 3 weeks Miguel Aschoff [place]. [facility], [facility]   In 3 weeks Turner, Eber Hong, MD [facility] [address] A Dept Of [place]. [facility], [facility]   In 3 months [place] Skye, Blinda Leatherwood [facility] A Dept Of [place]. [place]

## 2021-11-10 NOTE — Telephone Encounter (Signed)
Patient called the office stating she was seen this morning and had a high blood pressure. Patient states she was told to check it again once she got home. Patient states she ate once she got home and checked it again. Patient states her blood pressure is now 116/63.

## 2021-11-10 NOTE — Addendum Note (Signed)
Addended by: Earnestine Mealing on: 11/10/2021 03:15 PM   Modules accepted: Orders

## 2021-11-11 ENCOUNTER — Encounter: Payer: Self-pay | Admitting: Neurology

## 2021-11-12 ENCOUNTER — Other Ambulatory Visit: Payer: Self-pay

## 2021-11-12 DIAGNOSIS — R202 Paresthesia of skin: Secondary | ICD-10-CM

## 2021-11-13 NOTE — Addendum Note (Signed)
Addended by: Elmon Else on: 11/13/2021 03:34 PM   Modules accepted: Orders

## 2021-11-26 ENCOUNTER — Ambulatory Visit
Admission: RE | Admit: 2021-11-26 | Discharge: 2021-11-26 | Disposition: A | Discharge status: Home / Self Care | Payer: Medicare Other | Source: Ambulatory Visit | Attending: Family Medicine | Admitting: Family Medicine

## 2021-11-26 DIAGNOSIS — M545 Low back pain, unspecified: Secondary | ICD-10-CM

## 2021-11-26 DIAGNOSIS — M5416 Radiculopathy, lumbar region: Secondary | ICD-10-CM

## 2021-11-30 NOTE — Progress Notes (Signed)
Office Visit    Patient Name: Sydney Booth Date of Encounter: 12/01/2021  PCP:  Camillia Herter, NP   [place]  Cardiologist:  Sinclair Grooms, MD  Advanced Practice Provider:  No care team member to display Electrophysiologist:  None   HPI    Sydney Booth is a 88 y.o. female with a past medical history significant for hypertension, hyperlipidemia, OSA on CPAP, obesity, coronary calcification on CT, neuropathy, mitral stenosis and mitral regurgitation, and diastolic heart failure presents today for follow-up appointment.  Patient has major limitations from dyspnea on exertion.  She also has a history of orthopnea.  She has seen a pulmonologist but there has been no pulmonary explanation noted.  She denied chest pain.  She has occasional lower extremity swelling.  She was last seen by Dr. Tamala Julian 10/06/2021.  Today, she is not quite as dizzy but she still has dizziness.  She did all the tests for vertigo and she tried a couple different of medications without relief. She tells me the [place] is too expensive and she feels like it caused her constipation. Overall, no cardiac symptoms. She is not having lower extremity edema but does have severe peripheral neuropathy. She has tried many things for this including PT, electrical impulse therapy, and medications. We discussed a referral to an ENT doctor for further workup. She has an appointment set up wed with Dr. Radford Pax for her OSA.   Reports no shortness of breath nor dyspnea on exertion. Reports no chest pain, pressure, or tightness. No edema, orthopnea, PND. Reports no palpitations.    Past Medical History    Past Medical History:  Diagnosis Date   Arthritis    Blood transfusion    Cervical vertebral fracture (HCC)    fued on its on   Closed head injury    as a chlid   Coronary artery calcification seen on CT scan    Fall 12/22/2017   right eye bruised   Gastric ulcer    GERD (gastroesophageal  reflux disease)    H/O hiatal hernia    Headache(784.0)    Hyperlipemia    Hypertension    Left carpal tunnel syndrome 06/08/2018   Mild pulmonary hypertension (HCC)    Mitral regurgitation    Mitral stenosis    Mitral valve prolapse    Obesity    OSA on CPAP    Osteopenia    Rotator cuff tear    Left   Seasonal allergies    Shortness of breath    With activity   Ulcer 1991   [place]    Past Surgical History:  Procedure Laterality Date   BIospy      Biospy      Uterus wall   Carparal Tunnel     CATARACT EXTRACTION     Colonscopy     EYE SURGERY     Right 2011, Left 2008   LIPOMA EXCISION     FROM BACK    THUMB FUSION     TOTAL KNEE ARTHROPLASTY  03/24/2011   Procedure: TOTAL KNEE ARTHROPLASTY;  Surgeon: Hessie Dibble, MD;  Location: [facility];  Service: Orthopedics;  Laterality: Left;  with revision tibial component   TUBAL LIGATION      Allergies  Allergies  Allergen Reactions   Cymbalta [Duloxetine Hcl]    Gabapentin     dizzy   Statins Other (See Comments)   Sulfa Antibiotics Hives, Other (See Comments) and Nausea  And Vomiting   Zocor [Simvastatin]     Muscle aches    EKGs/Labs/Other Studies Reviewed:   The following studies were reviewed today:  Nuclear stress test December 2022: Study Highlights       Findings are consistent with no prior ischemia and no prior myocardial infarction. The study is low risk.   No ST deviation was noted.   Left ventricular function is normal. Nuclear stress EF: 63 %. The left ventricular ejection fraction is normal (55-65%). End diastolic cavity size is normal. End systolic cavity size is normal.   Prior study not available for comparison.   Findings: Negative for stress induced arrhythmias. Baseline LBBB (pharmacologic test).  There is slight decrease in apical counts in the rest that improves with stress.  No evidence of ischemia or infarction.     ECHO 2022: IMPRESSIONS     1. Left ventricular ejection  fraction, by estimation, is 60 to 65%. The  left ventricle has normal function. The left ventricle has no regional  wall motion abnormalities. There is mild concentric left ventricular  hypertrophy. Left ventricular diastolic  function could not be evaluated.   2. Right ventricular systolic function is normal. The right ventricular  size is moderately enlarged. There is moderately elevated pulmonary artery  systolic pressure.   3. Left atrial size was moderately dilated.   4. Right atrial size was mild to moderately dilated.   5. The mitral valve is abnormal. Mild mitral valve regurgitation. The  mean mitral valve gradient is 3.0 mmHg with average heart rate of 86 bpm.  Severe mitral annular calcification.   6. The aortic valve is grossly normal. There is mild calcification of the  aortic valve. Aortic valve regurgitation is not visualized. Aortic valve  sclerosis/calcification is present, without any evidence of aortic  stenosis.   7. The inferior vena cava is normal in size with <50% respiratory  variability, suggesting right atrial pressure of 8 mmHg.   Comparison(s): No significant change from prior study.        EKG:  EKG normal sinus rhythm, first-degree AV block, left bundle branch block with QRSD 134 ms.  No change when compared to prior study performed August 2022.   Recent Labs: 12/10/2020: NT-Pro BNP 730 09/29/2021: ALT 10; BUN 12; Creatinine, Ser 0.73; Hemoglobin 13.1; Platelets 286; Potassium 4.2; Sodium 133; TSH 2.160  Recent Lipid Panel Labs (Brief)          Component Value Date/Time    CHOL 187 02/06/2020 1502    TRIG 108 02/06/2020 1502    HDL 67 02/06/2020 1502    CHOLHDL 2.8 02/06/2020 1502    LDLCALC 101 (H) 02/06/2020 1502           EKG:  EKG is  ordered today.  The ekg ordered today demonstrates NSR rate 70 bpm  Recent Labs: 12/10/2020: NT-Pro BNP 730 09/29/2021: ALT 10; Hemoglobin 13.1; Platelets 286; TSH 2.160 10/20/2021: BUN 11; Creatinine, Ser  0.78; Potassium 4.7; Sodium 137  Recent Lipid Panel    Component Value Date/Time   CHOL 187 02/06/2020 1502   TRIG 108 02/06/2020 1502   HDL 67 02/06/2020 1502   CHOLHDL 2.8 02/06/2020 1502   LDLCALC 101 (H) 02/06/2020 1502    Home Medications   Current Meds  Medication Sig   acetaminophen (TYLENOL) 325 MG tablet Take 2 tablets (650 mg total) by mouth every 6 (six) hours as needed for moderate pain.   aspirin 81 MG tablet Take 81 mg by mouth  daily.   calcium carbonate (OS-CAL) 600 MG TABS Take 600 mg by mouth daily.   CINNAMON PO Take 500 mg by mouth 2 (two) times daily.   dapagliflozin propanediol (FARXIGA) 10 MG TABS tablet Take 1 tablet (10 mg total) by mouth daily before breakfast.   ezetimibe (ZETIA) 10 MG tablet TAKE 1 TABLET BY MOUTH  DAILY   metoprolol tartrate (LOPRESSOR) 25 MG tablet TAKE 1 TABLET BY MOUTH  TWICE DAILY   Omega-3 Fatty Acids (FISH OIL) 1000 MG CPDR Take 1,000 mg by mouth daily.   omeprazole (PRILOSEC) 40 MG capsule Take 1 capsule (40 mg total) by mouth daily.   QUERCETIN PO Take 1,500 mg by mouth daily.   Turmeric 500 MG CAPS Take by mouth daily.   VITAMIN A PO Take 10,000 mg by mouth.   vitamin B-12 (CYANOCOBALAMIN) 1000 MCG tablet Take 1,000 mcg by mouth daily.   VITAMIN D, CHOLECALCIFEROL, PO Take 1 tablet by mouth daily.     Review of Systems      All other systems reviewed and are otherwise negative except as noted above.  Physical Exam    VS:  BP 136/74   Pulse 70   Ht 5' (1.524 m)   Wt 220 lb 6.4 oz (100 kg)   SpO2 98%   BMI 43.04 kg/m  , BMI Body mass index is 43.04 kg/m.  Wt Readings from Last 3 Encounters:  12/01/21 220 lb 6.4 oz (100 kg)  11/10/21 215 lb (97.5 kg)  10/13/21 218 lb 9.6 oz (99.2 kg)     GEN: Well nourished, well developed, in no acute distress. HEENT: normal. Neck: Supple, no JVD, carotid bruits, or masses. Cardiac: RRR, no murmurs, rubs, or gallops. No clubbing, cyanosis, edema.  Radials/PT 2+ and equal  bilaterally. Some pain in her lower extremity with palpation. Respiratory:  Respirations regular and unlabored, clear to auscultation bilaterally. GI: Soft, nontender, nondistended. MS: No deformity or atrophy. Skin: Warm and dry, no rash. Neuro:  Strength and sensation are intact. Psych: Normal affect.  Assessment & Plan    Chronic diastolic heart failure -continue current medication regimen: Farxiga '10mg'$  daily (she is going to check on Jardiance and if her insurance covers this medication for less money), lopressor '25mg'$  BID, and zetia '10mg'$  daily -euvolemic on exam today -MRA therapy would be next to add but would like to see how she does with possible SGLT2 switch first  Coronary artery calcification seen on CT scan/Hyperlipidemia -no chest pain -continue current medication regimen -LDL above goal at 101 -next visit would recommend a lipid panel  Hypertension -BP well controlled today -continue to monitor at home -continue current medication regimen  Dyspnea on exertion -this is chronic -last echo 12/22 reviewed -continue GDMT  Mitral valve disease -severe annular calcification with mild regurgitation -repeat echo at next visit  Mild pulmonary hypertension -not addressed today         Disposition: Follow up 2 month with Sinclair Grooms, MD or APP.  Signed, Elgie Collard, PA-C 12/01/2021, 9:55 AM [facility]

## 2021-12-01 ENCOUNTER — Other Ambulatory Visit: Payer: Self-pay | Admitting: Family

## 2021-12-01 ENCOUNTER — Ambulatory Visit: Payer: Medicare Other | Attending: Physician Assistant | Admitting: Physician Assistant

## 2021-12-01 ENCOUNTER — Ambulatory Visit: Payer: Medicare Other | Admitting: Physician Assistant

## 2021-12-01 ENCOUNTER — Encounter: Payer: Self-pay | Admitting: Physician Assistant

## 2021-12-01 VITALS — BP 136/74 | HR 70 | Ht 60.0 in | Wt 220.4 lb

## 2021-12-01 DIAGNOSIS — I059 Rheumatic mitral valve disease, unspecified: Secondary | ICD-10-CM | POA: Diagnosis not present

## 2021-12-01 DIAGNOSIS — E785 Hyperlipidemia, unspecified: Secondary | ICD-10-CM | POA: Diagnosis present

## 2021-12-01 DIAGNOSIS — I10 Essential (primary) hypertension: Secondary | ICD-10-CM | POA: Diagnosis not present

## 2021-12-01 DIAGNOSIS — J370 Chronic laryngitis: Secondary | ICD-10-CM | POA: Diagnosis present

## 2021-12-01 DIAGNOSIS — I2510 Atherosclerotic heart disease of native coronary artery without angina pectoris: Secondary | ICD-10-CM | POA: Diagnosis not present

## 2021-12-01 DIAGNOSIS — K219 Gastro-esophageal reflux disease without esophagitis: Secondary | ICD-10-CM

## 2021-12-01 DIAGNOSIS — Z8679 Personal history of other diseases of the circulatory system: Secondary | ICD-10-CM

## 2021-12-01 DIAGNOSIS — Z0120 Encounter for dental examination and cleaning without abnormal findings: Secondary | ICD-10-CM

## 2021-12-01 DIAGNOSIS — I272 Pulmonary hypertension, unspecified: Secondary | ICD-10-CM | POA: Diagnosis present

## 2021-12-01 DIAGNOSIS — R42 Dizziness and giddiness: Secondary | ICD-10-CM | POA: Insufficient documentation

## 2021-12-01 DIAGNOSIS — I5032 Chronic diastolic (congestive) heart failure: Secondary | ICD-10-CM | POA: Insufficient documentation

## 2021-12-01 DIAGNOSIS — R0609 Other forms of dyspnea: Secondary | ICD-10-CM | POA: Diagnosis present

## 2021-12-01 DIAGNOSIS — E782 Mixed hyperlipidemia: Secondary | ICD-10-CM

## 2021-12-01 MED ORDER — OMEPRAZOLE 40 MG PO CPDR: 40.0000 mg | DELAYED_RELEASE_CAPSULE | Freq: Every day | ORAL | 2 refills | Status: DC

## 2021-12-01 NOTE — Patient Instructions (Addendum)
Medication Instructions:  Your physician recommends that you continue on your current medications as directed. Please refer to the Current Medication list given to you today.  *If you need a refill on your cardiac medications before your next appointment, please call your pharmacy*   Lab Work: None If you have labs (blood work) drawn today and your tests are completely normal, you will receive your results only by: [place] (if you have MyChart) OR A paper copy in the mail If you have any lab test that is abnormal or we need to change your treatment, we will call you to review the results.   Follow-Up: At [facility], you and your health needs are our priority.  As part of our continuing mission to provide you with exceptional heart care, we have created designated Provider Care Teams.  These Care Teams include your primary Cardiologist (physician) and Advanced Practice Providers (APPs -  Physician Assistants and Nurse Practitioners) who all work together to provide you with the care you need, when you need it.  We recommend signing up for the patient portal called "MyChart".  Sign up information is provided on this After Visit Summary.  MyChart is used to connect with patients for Virtual Visits (Telemedicine).  Patients are able to view lab/test results, encounter notes, upcoming appointments, etc.  Non-urgent messages can be sent to your provider as well.   To learn more about what you can do with MyChart, go to [email or web address].    Your next appointment:   Keep 12/03/21 2:00 PM appointment with Dr Radford Pax  Then  2 month(s)  The format for your next appointment:   In Person  Provider:   Dr Johney Frame  Other Instructions 1.You have been referred to Dr Janace Hoard with ENT  2.Check with your insurance regarding coverage for Jardiance  Important Information About Sugar

## 2021-12-01 NOTE — Telephone Encounter (Signed)
Call patient with update.   - Patient established with cardiology for management of hypertension and hyperlipidemia. Last appointment today 12/01/2021. Request Metoprolol and Ezetimibe refills from the same.   - Omeprazole refilled per patient request.  - Confirm if patient scheduled for dental procedure soon so that Amoxicillin can be refilled if appropriate.   - If I can be of further assistance notify me.

## 2021-12-03 ENCOUNTER — Ambulatory Visit: Payer: Medicare Other | Attending: Cardiology | Admitting: Cardiology

## 2021-12-03 ENCOUNTER — Encounter: Payer: Self-pay | Admitting: Cardiology

## 2021-12-03 VITALS — BP 138/80 | HR 72 | Ht 60.0 in | Wt 216.6 lb

## 2021-12-03 DIAGNOSIS — I2510 Atherosclerotic heart disease of native coronary artery without angina pectoris: Secondary | ICD-10-CM | POA: Diagnosis not present

## 2021-12-03 DIAGNOSIS — I10 Essential (primary) hypertension: Secondary | ICD-10-CM | POA: Diagnosis not present

## 2021-12-03 DIAGNOSIS — G4733 Obstructive sleep apnea (adult) (pediatric): Secondary | ICD-10-CM | POA: Insufficient documentation

## 2021-12-03 NOTE — Progress Notes (Addendum)
Sleep Medicine CONSULT Note    Date:  12/03/2021   ID:  Sydney Booth, DOB 1931/12/01, MRN [id number]  PCP:  Camillia Herter, NP  Cardiologist: Daneen Schick, MD  Chief Complaint  Patient presents with   New Patient (Initial Visit)    OSA    History of Present Illness:  Sydney Booth is a 88 y.o. female who is being seen today for the evaluation of obstructive sleep apnea at the request of Daneen Schick, MD.  This is an 88 year old female with a history of coronary artery calcifications, hyperlipidemia, hypertension, pulmonary hypertension, mitral regurgitation/stenosis and a history of obstructive sleep apnea on CPAP in the past.  She has not been followed by a sleep medicine physician in over 6 years and is now referred to establish sleep care.  She is doing well with her PAP device.  She tolerates the mask and feels the pressure is adequate.  Her main issue is that she wakes up between 2-3 in the am to go to the bathroom and then cannot go back to sleep and gest up at 4am.  She goes to bed at 9pm.  She feels tired when she gets up in the am but does not nap during the day except dosing when watching TV.   She denies any significant mouth or nasal dryness but does have some nasal congestion.  She does not think that she snores.    Past Medical History:  Diagnosis Date   Arthritis    Blood transfusion    Cervical vertebral fracture (HCC)    fued on its on   Closed head injury    as a chlid   Coronary artery calcification seen on CT scan    Fall 12/22/2017   right eye bruised   Gastric ulcer    GERD (gastroesophageal reflux disease)    H/O hiatal hernia    Headache(784.0)    Hyperlipemia    Hypertension    Left carpal tunnel syndrome 06/08/2018   Mild pulmonary hypertension (HCC)    Mitral regurgitation    Mitral stenosis    Mitral valve prolapse    Obesity    OSA on CPAP    Osteopenia    Rotator cuff tear    Left   Seasonal allergies    Shortness of breath     With activity   Ulcer 1991   [place]     Past Surgical History:  Procedure Laterality Date   BIospy      Biospy      Uterus wall   Carparal Tunnel     CATARACT EXTRACTION     Colonscopy     EYE SURGERY     Right 2011, Left 2008   LIPOMA EXCISION     FROM BACK    THUMB FUSION     TOTAL KNEE ARTHROPLASTY  03/24/2011   Procedure: TOTAL KNEE ARTHROPLASTY;  Surgeon: Hessie Dibble, MD;  Location: [facility];  Service: Orthopedics;  Laterality: Left;  with revision tibial component   TUBAL LIGATION      Current Medications: Current Meds  Medication Sig   acetaminophen (TYLENOL) 325 MG tablet Take 2 tablets (650 mg total) by mouth every 6 (six) hours as needed for moderate pain.   Ascorbic Acid (VITAMIN C PO) Take 1,000 mg by mouth in the morning and at bedtime.   aspirin 81 MG tablet Take 81 mg by mouth daily.   calcium carbonate (OS-CAL) 600 MG TABS Take  600 mg by mouth daily.   CINNAMON PO Take 500 mg by mouth 2 (two) times daily.   dapagliflozin propanediol (FARXIGA) 10 MG TABS tablet Take 1 tablet (10 mg total) by mouth daily before breakfast.   ezetimibe (ZETIA) 10 MG tablet TAKE 1 TABLET BY MOUTH  DAILY   metoprolol tartrate (LOPRESSOR) 25 MG tablet TAKE 1 TABLET BY MOUTH  TWICE DAILY   Omega-3 Fatty Acids (FISH OIL) 1000 MG CPDR Take 1,000 mg by mouth daily.   omeprazole (PRILOSEC) 40 MG capsule Take 1 capsule (40 mg total) by mouth daily.   QUERCETIN PO Take 1,500 mg by mouth daily.   Turmeric 500 MG CAPS Take by mouth daily.   VITAMIN A PO Take 10,000 mg by mouth.   vitamin B-12 (CYANOCOBALAMIN) 1000 MCG tablet Take 1,000 mcg by mouth daily.   VITAMIN D, CHOLECALCIFEROL, PO Take 1 tablet by mouth daily.   Zinc Acetate, Oral, (ZINC ACETATE PO) Take 1 tablet by mouth.   Zinc Oxide 40 % PSTE Apply 1 application topically as needed.    Allergies:   Cymbalta [duloxetine hcl], Gabapentin, Statins, Sulfa antibiotics, and Zocor [simvastatin]   Social History   Socioeconomic  History   Marital status: Widowed    Spouse name: Not on file   Number of children: 5   Years of education: 67   Highest education level: Not on file  Occupational History   Occupation: Retired   Tobacco Use   Smoking status: Never    Passive exposure: Never   Smokeless tobacco: Never  Vaping Use   Vaping Use: Never used  Substance and Sexual Activity   Alcohol use: No    Alcohol/week: 0.0 standard drinks of alcohol   Drug use: No   Sexual activity: Not Currently  Other Topics Concern   Not on file  Social History Narrative   Patient lives at home with daughter   Patient is retired   Patient is widowed   Patient has a high school education   Patient has 5 children    Social Determinants of Radio broadcast assistant Strain: Low Risk  (09/18/2020)   Overall Financial Resource Strain (CARDIA)    Difficulty of Paying Living Expenses: Not hard at all  Food Insecurity: No Food Insecurity (09/18/2020)   Hunger Vital Sign    Worried About Running Out of Food in the Last Year: Never true    [place] in the Last Year: Never true  Transportation Needs: No Transportation Needs (09/18/2020)   PRAPARE - Hydrologist (Medical): No    Lack of Transportation (Non-Medical): No  Physical Activity: Inactive (09/18/2020)   Exercise Vital Sign    Days of Exercise per Week: 0 days    Minutes of Exercise per Session: 0 min  Stress: No Stress Concern Present (09/18/2020)   [place]    Feeling of Stress : Not at all  Social Connections: Moderately Isolated (09/18/2020)   Social Connection and Isolation Panel [NHANES]    Frequency of Communication with Friends and Family: More than three times a week    Frequency of Social Gatherings with Friends and Family: More than three times a week    Attends Religious Services: More than 4 times per year    Active Member of [employer] or Organizations: No     Attends Archivist Meetings: Never    Marital Status: Widowed  Family History:  The patient's family history includes ALS in her son; Autism in her grandson; Cancer in her father; Healthy in her daughter; Heart disease in her brother; Pulmonary embolism in her brother; Sleep apnea in her brother and brother; Stroke in her mother; Tourette syndrome in her son.   ROS:   Please see the history of present illness.    ROS All other systems reviewed and are negative.     02/01/2018    9:41 AM  PAD Screen  Previous PAD dx? No  Previous surgical procedure? No  Pain with walking? No  Feet/toe relief with dangling? No  Painful, non-healing ulcers? No  Extremities discolored? No       PHYSICAL EXAM:   VS:  BP 138/80   Pulse 72   Ht 5' (1.524 m)   Wt 216 lb 9.6 oz (98.2 kg)   SpO2 99%   BMI 42.30 kg/m    GEN: Well nourished, well developed, in no acute distress  HEENT: normal  Neck: no JVD, carotid bruits, or masses Cardiac: RRR; no  rubs, or gallops,no edema.  2/6 SM at RUSB Intact distal pulses bilaterally.  Respiratory:  clear to auscultation bilaterally, normal work of breathing GI: soft, nontender, nondistended, + BS MS: no deformity or atrophy  Skin: warm and dry, no rash Neuro:  Alert and Oriented x 3, Strength and sensation are intact Psych: euthymic mood, full affect  Wt Readings from Last 3 Encounters:  12/03/21 216 lb 9.6 oz (98.2 kg)  12/01/21 220 lb 6.4 oz (100 kg)  11/10/21 215 lb (97.5 kg)      Studies/Labs Reviewed:   PAP compliance download  Recent Labs: 12/10/2020: NT-Pro BNP 730 09/29/2021: ALT 10; Hemoglobin 13.1; Platelets 286; TSH 2.160 10/20/2021: BUN 11; Creatinine, Ser 0.78; Potassium 4.7; Sodium 137    Additional studies/ records that were reviewed today include:  Office notes by Dr. Tamala Julian    ASSESSMENT:    1. OSA (obstructive sleep apnea)   2. Essential hypertension      PLAN:  In order of problems listed above:  OSA  - The patient is tolerating PAP therapy well without any problems. The PAP download performed by his DME was personally reviewed and interpreted by me today and showed an AHI of 0.4 /hr on auto CPAP from 4-20 cm H2O with 100% compliance in using more than 4 hours nightly.  The patient has been using and benefiting from PAP use and will continue to benefit from therapy.   -I encouraged her to try nasal saline spray BID to help with nasal congestion -Her device is over 74 years old so I will order her a new ResMed auto CPAP device with heated humidity and mask of choice from 4-20cm H2O.  -she will see me back 6 weeks after she gets her new device  2.  Hypertension -BP controlled on exam -Continue prescription drug management with Lopressor 25 mg twice daily with as needed refills   Time Spent: 20 minutes total time of encounter, including 15 minutes spent in face-to-face patient care on the date of this encounter. This time includes coordination of care and counseling regarding above mentioned problem list. Remainder of non-face-to-face time involved reviewing chart documents/testing relevant to the patient encounter and documentation in the medical record. I have independently reviewed documentation from referring provider  Medication Adjustments/Labs and Tests Ordered: Current medicines are reviewed at length with the patient today.  Concerns regarding medicines are outlined above.  Medication changes, Labs  and Tests ordered today are listed in the Patient Instructions below.  There are no Patient Instructions on file for this visit.   Signed, Fransico Him, MD  12/03/2021 2:38 PM    [address], [place], [place]  [address] Phone: [phone]; Fax: [phone]

## 2021-12-03 NOTE — Patient Instructions (Signed)
Medication Instructions:  Your physician recommends that you continue on your current medications as directed. Please refer to the Current Medication list given to you today.  *If you need a refill on your cardiac medications before your next appointment, please call your pharmacy*   Lab Work: None. If you have labs (blood work) drawn today and your tests are completely normal, you will receive your results only by: [place] (if you have MyChart) OR A paper copy in the mail If you have any lab test that is abnormal or we need to change your treatment, we will call you to review the results.   Testing/Procedures: None.   Follow-Up: At [facility], you and your health needs are our priority.  As part of our continuing mission to provide you with exceptional heart care, we have created designated Provider Care Teams.  These Care Teams include your primary Cardiologist (physician) and Advanced Practice Providers (APPs -  Physician Assistants and Nurse Practitioners) who all work together to provide you with the care you need, when you need it.  We recommend signing up for the patient portal called "MyChart".  Sign up information is provided on this After Visit Summary.  MyChart is used to connect with patients for Virtual Visits (Telemedicine).  Patients are able to view lab/test results, encounter notes, upcoming appointments, etc.  Non-urgent messages can be sent to your provider as well.   To learn more about what you can do with MyChart, go to [email or web address].    For your next appointment, we will contact you for follow up.  Provider:   Dr. Fransico Him, MD   Other Instructions Our sleep coordinator will order a Resmed Auto CPAP (4-20 cm H20) with supplies and mask of choice. They will call you to schedule a follow up appointment once this is completed.   Important Information About Sugar

## 2021-12-05 ENCOUNTER — Encounter: Payer: Self-pay | Admitting: *Deleted

## 2021-12-05 ENCOUNTER — Telehealth: Payer: Self-pay | Admitting: *Deleted

## 2021-12-05 DIAGNOSIS — I10 Essential (primary) hypertension: Secondary | ICD-10-CM

## 2021-12-05 DIAGNOSIS — I5032 Chronic diastolic (congestive) heart failure: Secondary | ICD-10-CM

## 2021-12-05 DIAGNOSIS — G4733 Obstructive sleep apnea (adult) (pediatric): Secondary | ICD-10-CM

## 2021-12-05 NOTE — Telephone Encounter (Signed)
-----   Message from Joni Reining, RN sent at 12/03/2021  2:41 PM EST ----- Regarding: ResMed Auto Cpap Ola Spurr, Per Dr. Radford Pax, she would like to order this patient a Resmed Auto Cpap with 4-20 cm H20 with supplies and mask of choice. Would you also be able to schedule a follow up appointment once that is done?   Thanks! Eather Colas, RN, BSN, [employer]

## 2021-12-05 NOTE — Telephone Encounter (Signed)
Order placed to [facility] via  community message for.Marland Kitchen Resmed Auto Cpap with 4-20 cm H20 with supplies and mask of choice.

## 2021-12-05 NOTE — Telephone Encounter (Signed)
This encounter was created in error - please disregard.

## 2021-12-15 ENCOUNTER — Other Ambulatory Visit: Payer: Self-pay

## 2021-12-15 ENCOUNTER — Other Ambulatory Visit: Payer: Self-pay | Admitting: Family

## 2021-12-15 DIAGNOSIS — Z8679 Personal history of other diseases of the circulatory system: Secondary | ICD-10-CM

## 2021-12-15 DIAGNOSIS — I10 Essential (primary) hypertension: Secondary | ICD-10-CM

## 2021-12-15 DIAGNOSIS — E782 Mixed hyperlipidemia: Secondary | ICD-10-CM

## 2021-12-15 DIAGNOSIS — Z0120 Encounter for dental examination and cleaning without abnormal findings: Secondary | ICD-10-CM

## 2021-12-15 MED ORDER — METOPROLOL TARTRATE 25 MG PO TABS: 25.0000 mg | ORAL_TABLET | Freq: Two times a day (BID) | ORAL | 1 refills | Status: DC

## 2021-12-15 MED ORDER — EZETIMIBE 10 MG PO TABS: 10.0000 mg | ORAL_TABLET | Freq: Every day | ORAL | 1 refills | Status: DC

## 2021-12-16 ENCOUNTER — Ambulatory Visit (INDEPENDENT_AMBULATORY_CARE_PROVIDER_SITE_OTHER): Payer: Medicare Other | Admitting: Neurology

## 2021-12-16 DIAGNOSIS — M5417 Radiculopathy, lumbosacral region: Secondary | ICD-10-CM

## 2021-12-16 DIAGNOSIS — G629 Polyneuropathy, unspecified: Secondary | ICD-10-CM | POA: Diagnosis not present

## 2021-12-16 DIAGNOSIS — R202 Paresthesia of skin: Secondary | ICD-10-CM

## 2021-12-16 NOTE — Progress Notes (Signed)
NCV with EMG consistent with chronic, symmetric sensorimotor polyneuropathy affecting both LE. Please notify the patient.  I would recommend continuing to follow up with neurology.

## 2021-12-16 NOTE — Procedures (Signed)
[facility]  [address]  [place], [place] [address] Tel: [phone] Fax: [phone] Test Date:  12/16/2021  Patient: Sydney Booth DOB: 02/21/1931 Physician: Narda Amber, DO  Sex: Female Height: '5\' 0"'$  Ref Phys: [name], PA-C  ID#: [id number]   Technician:    History: This is a 88 year old female referred for evaluation of bilateral feet numbness.  NCV & EMG Findings: Extensive electrodiagnostic testing of the right lower extremity and additional studies of the left shows:  Bilateral sural and superficial peroneal sensory responses are absent. Bilateral peroneal and tibial motor responses show reduced amplitude. Right tibial H reflex study is absent. Chronic motor axon loss changes are seen affecting all the tested muscles involving the L3-S1 myotome, without accompanied active denervation.  Impression: The electrophysiologic findings are consistent with a chronic and symmetric sensorimotor axonal polyneuropathy affecting the lower extremities. Probable superimposed multilevel chronic radiculopathies affecting bilateral L3-S1 nerve root/segments.   ___________________________ Narda Amber, DO    Nerve Conduction Studies   Stim Site NR Peak (ms) Norm Peak (ms) O-P Amp (V) Norm O-P Amp  Left Sup Peroneal Anti Sensory (Ant Lat Mall)  33 C  12 cm *NR  <4.6  >3  Right Sup Peroneal Anti Sensory (Ant Lat Mall)  33 C  12 cm *NR  <4.6  >3  Left Sural Anti Sensory (Lat Mall)  33 C  Calf *NR  <4.6  >3  Right Sural Anti Sensory (Lat Mall)  33 C  Calf *NR  <4.6  >3     Stim Site NR Onset (ms) Norm Onset (ms) O-P Amp (mV) Norm O-P Amp Site1 Site2 Delta-0 (ms) Dist (cm) Vel (m/s) Norm Vel (m/s)  Left Peroneal Motor (Ext Dig Brev)  33 C  Ankle    3.4 <6.0 *0.8 >2.5 B Fib Ankle 7.1 30.0 42 >40  B Fib    10.5  0.7  Poplt B Fib 1.7 7.0 41 >40  Poplt    12.2  0.7         Right Peroneal Motor (Ext Dig Brev)  33 C  Ankle    3.4 <6.0 *1.3 >2.5 B Fib  Ankle 7.0 30.0 43 >40  B Fib    10.4  1.3  Poplt B Fib 1.7 8.0 47 >40  Poplt    12.1  1.2         Left Peroneal TA Motor (Tib Ant)  33 C  Fib Head    2.7 <4.5 *2.6 >3 Poplit Fib Head 1.8 8.0 44 >40  Poplit    4.5 <5.7 2.6         Right Peroneal TA Motor (Tib Ant)  33 C  Fib Head    1.9 <4.5 *2.5 >3 Poplit Fib Head 1.9 8.0 42 >40  Poplit    3.8 <5.7 2.5         Left Tibial Motor (Abd Hall Brev)  33 C  Ankle    2.8 <6.0 *1.0 >4 Knee Ankle 8.8 38.0 43 >40  Knee    11.6  0.8         Right Tibial Motor (Abd Hall Brev)  33 C  Ankle    3.9 <6.0 *2.1 >4 Knee Ankle 7.7 38.0 49 >40  Knee    11.6  2.0          Electromyography   Side Muscle Ins.Act Fibs Fasc Recrt Amp Dur Poly Activation Comment  Right AntTibialis Nml Nml Nml *2- *1+ *1+ *1+ Nml  N/A  Right Gastroc Nml Nml Nml *1- *1+ *1+ *1+ Nml N/A  Right Flex Dig Long Nml Nml Nml *2- *1+ *1+ *1+ Nml N/A  Right RectFemoris Nml Nml Nml *1- *1+ *1+ *1+ Nml N/A  Right BicepsFemS Nml Nml Nml *1- *1+ *1+ *1+ Nml N/A  Right GluteusMed Nml Nml Nml *1- *1+ *1+ *1+ Nml N/A  Left AntTibialis Nml Nml Nml *2- *1+ *1+ *1+ Nml N/A  Left Gastroc Nml Nml Nml *2- *1+ *1+ *1+ Nml N/A      Waveforms:

## 2021-12-17 ENCOUNTER — Telehealth: Payer: Self-pay | Admitting: Interventional Cardiology

## 2021-12-17 NOTE — Telephone Encounter (Signed)
Pt c/o medication issue:  1. Name of Medication:  dapagliflozin propanediol (FARXIGA) 10 MG TABS tablet  2. How are you currently taking this medication (dosage and times per day)? Take 1 tablet (10 mg total) by mouth daily before breakfast.   3. Are you having a reaction (difficulty breathing--STAT)? Yes   4. What is your medication issue? Patient is having a reaction regarding the medication Farxiga. Please call back

## 2021-12-17 NOTE — Telephone Encounter (Signed)
Returned call to patient.  Patient states for the past month (since starting [place]) she has been having hoarseness and dizziness in the morning after taking the [place].  Patient states "I just don't feel right since starting this medication." She also reports no improvement in DOE while taking [place].  Will remove from medication list and place on allergy list as intolerance.  Advised patient to call our office if she experiences increased SOB or swelling. Patient verbalized understanding.   Will forward to Dr. Tamala Julian to review.

## 2022-01-22 NOTE — Progress Notes (Signed)
Cardiology Office Note:    Date:  02/02/2022   ID:  Sydney Booth, DOB 26-May-1931, MRN [id number]  PCP:  Camillia Herter, NP   [facility] Cardiologist:  Sinclair Grooms, MD {  Referring MD: Camillia Herter, NP    History of Present Illness:    Sydney Booth is a 89 y.o. female with a hx of HTN, HLD, OSA on CPAP, coronary Ca on CT, and chronic diastolic HF who was previously followed by Dr. Tamala Julian who now returns to clinic for follow-up.  Per review of the record, TTE 12/2020 with LVF 60-65%, normal RV, moderate LAE, mild to moderate RAE, mild MR, severe MAC. Myoview 12/2020 with normal perfusion, normal EF.  Was last seen by Dr. Radford Pax on 11/2021 where she was doing well with her CPAP.  Today, the patient states that she is not feeling any better. She continuing to have hoarseness of her voice and her ENT work-up was unrevealing. She has been off of farxiga due to worsening dizziness. Blood pressure has been running 115/70s at home. High today which she attributes to being in the MD office. No chest pain, SOB, orthopnea, or PND. Has chronic LE edema that is stable. Also with chronic dyspnea on exertion which is again stable and not worsening.   Past Medical History:  Diagnosis Date   Arthritis    Blood transfusion    Cervical vertebral fracture (HCC)    fued on its on   Closed head injury    as a chlid   Coronary artery calcification seen on CT scan    Fall 12/22/2017   right eye bruised   Gastric ulcer    GERD (gastroesophageal reflux disease)    H/O hiatal hernia    Headache(784.0)    Hyperlipemia    Hypertension    Left carpal tunnel syndrome 06/08/2018   Mild pulmonary hypertension (HCC)    Mitral regurgitation    Mitral stenosis    Mitral valve prolapse    Obesity    OSA on CPAP    Osteopenia    Rotator cuff tear    Left   Seasonal allergies    Shortness of breath    With activity   Ulcer 1991   [place]     Past Surgical  History:  Procedure Laterality Date   BIospy      Biospy      Uterus wall   [place] Tunnel     CATARACT EXTRACTION     Colonscopy     EYE SURGERY     Right 2011, Left 2008   LIPOMA EXCISION     FROM BACK    THUMB FUSION     TOTAL KNEE ARTHROPLASTY  03/24/2011   Procedure: TOTAL KNEE ARTHROPLASTY;  Surgeon: Hessie Dibble, MD;  Location: [facility];  Service: Orthopedics;  Laterality: Left;  with revision tibial component   TUBAL LIGATION      Current Medications: Current Meds  Medication Sig   acetaminophen (TYLENOL) 325 MG tablet Take 2 tablets (650 mg total) by mouth every 6 (six) hours as needed for moderate pain.   Ascorbic Acid (VITAMIN C PO) Take 1,000 mg by mouth in the morning and at bedtime.   aspirin 81 MG tablet Take 81 mg by mouth daily.   calcium carbonate (OS-CAL) 600 MG TABS Take 600 mg by mouth daily.   CINNAMON PO Take 500 mg by mouth 2 (two) times daily.   ezetimibe (ZETIA)  10 MG tablet Take 1 tablet (10 mg total) by mouth daily.   metoprolol tartrate (LOPRESSOR) 25 MG tablet Take 1 tablet (25 mg total) by mouth 2 (two) times daily.   Omega-3 Fatty Acids (FISH OIL) 1000 MG CPDR Take 1,000 mg by mouth daily.   omeprazole (PRILOSEC) 40 MG capsule Take 1 capsule (40 mg total) by mouth daily.   QUERCETIN PO Take 1,500 mg by mouth daily.   spironolactone (ALDACTONE) 25 MG tablet Take 0.5 tablets (12.5 mg total) by mouth daily.   Turmeric 500 MG CAPS Take by mouth daily.   VITAMIN A PO Take 10,000 mg by mouth.   vitamin B-12 (CYANOCOBALAMIN) 1000 MCG tablet Take 1,000 mcg by mouth daily.   VITAMIN D, CHOLECALCIFEROL, PO Take 1 tablet by mouth daily.   Zinc Acetate, Oral, (ZINC ACETATE PO) Take 1 tablet by mouth.   Zinc Oxide 40 % PSTE Apply 1 application topically as needed.   [DISCONTINUED] spironolactone (ALDACTONE) 25 MG tablet Take 1 tablet (25 mg total) by mouth daily.     Allergies:   Cymbalta [duloxetine hcl], Farxiga [dapagliflozin], Gabapentin, Statins,  Sulfa antibiotics, and Zocor [simvastatin]   Social History   Socioeconomic History   Marital status: Widowed    Spouse name: Not on file   Number of children: 5   Years of education: 38   Highest education level: Not on file  Occupational History   Occupation: Retired   Tobacco Use   Smoking status: Never    Passive exposure: Never   Smokeless tobacco: Never  Vaping Use   Vaping Use: Never used  Substance and Sexual Activity   Alcohol use: No    Alcohol/week: 0.0 standard drinks of alcohol   Drug use: No   Sexual activity: Not Currently  Other Topics Concern   Not on file  Social History Narrative   Patient lives at home with daughter   Patient is retired   Patient is widowed   Patient has a high school education   Patient has 5 children    Social Determinants of Radio broadcast assistant Strain: Low Risk  (09/18/2020)   Overall Financial Resource Strain (CARDIA)    Difficulty of Paying Living Expenses: Not hard at all  Food Insecurity: No Food Insecurity (09/18/2020)   Hunger Vital Sign    Worried About Running Out of Food in the Last Year: Never true    Van in the Last Year: Never true  Transportation Needs: No Transportation Needs (09/18/2020)   PRAPARE - Hydrologist (Medical): No    Lack of Transportation (Non-Medical): No  Physical Activity: Inactive (09/18/2020)   Exercise Vital Sign    Days of Exercise per Week: 0 days    Minutes of Exercise per Session: 0 min  Stress: No Stress Concern Present (09/18/2020)   [place]    Feeling of Stress : Not at all  Social Connections: Moderately Isolated (09/18/2020)   Social Connection and Isolation Panel [NHANES]    Frequency of Communication with Friends and Family: More than three times a week    Frequency of Social Gatherings with Friends and Family: More than three times a week    Attends Religious  Services: More than 4 times per year    Active Member of [employer] or Organizations: No    Attends Archivist Meetings: Never    Marital Status: Widowed  Family History: The patient's family history includes ALS in her son; Autism in her grandson; Cancer in her father; Healthy in her daughter; Heart disease in her brother; Pulmonary embolism in her brother; Sleep apnea in her brother and brother; Stroke in her mother; Tourette syndrome in her son. There is no history of Anesthesia problems or Breast cancer.  ROS:   Please see the history of present illness.     All other systems reviewed and are negative.  EKGs/Labs/Other Studies Reviewed:    The following studies were reviewed today: TTE 01/04/2021: IMPRESSIONS     1. Left ventricular ejection fraction, by estimation, is 60 to 65%. The  left ventricle has normal function. The left ventricle has no regional  wall motion abnormalities. There is mild concentric left ventricular  hypertrophy. Left ventricular diastolic  function could not be evaluated.   2. Right ventricular systolic function is normal. The right ventricular  size is moderately enlarged. There is moderately elevated pulmonary artery  systolic pressure.   3. Left atrial size was moderately dilated.   4. Right atrial size was mild to moderately dilated.   5. The mitral valve is abnormal. Mild mitral valve regurgitation. The  mean mitral valve gradient is 3.0 mmHg with average heart rate of 86 bpm.  Severe mitral annular calcification.   6. The aortic valve is grossly normal. There is mild calcification of the  aortic valve. Aortic valve regurgitation is not visualized. Aortic valve  sclerosis/calcification is present, without any evidence of aortic  stenosis.   7. The inferior vena cava is normal in size with <50% respiratory  variability, suggesting right atrial pressure of 8 mmHg.   Comparison(s): No significant change from prior study.   Myoview 2022:    Findings are consistent with no prior ischemia and no prior myocardial infarction. The study is low risk.   No ST deviation was noted.   Left ventricular function is normal. Nuclear stress EF: 63 %. The left ventricular ejection fraction is normal (55-65%). End diastolic cavity size is normal. End systolic cavity size is normal.   Prior study not available for comparison.   Findings: Negative for stress induced arrhythmias. Baseline LBBB (pharmacologic test).  There is slight decrease in apical counts in the rest that improves with stress.  No evidence of ischemia or infarction.   Conclusions: Stress test is negative. Low risk study.  EKG:  EKG is not ordered today.    Recent Labs: 09/29/2021: ALT 10; Hemoglobin 13.1; Platelets 286; TSH 2.160 10/20/2021: BUN 11; Creatinine, Ser 0.78; Potassium 4.7; Sodium 137  Recent Lipid Panel    Component Value Date/Time   CHOL 187 02/06/2020 1502   TRIG 108 02/06/2020 1502   HDL 67 02/06/2020 1502   CHOLHDL 2.8 02/06/2020 1502   LDLCALC 101 (H) 02/06/2020 1502     Risk Assessment/Calculations:            Physical Exam:    VS:  BP (!) 176/80   Pulse 69   Ht 5' (1.524 m)   Wt 215 lb 12.8 oz (97.9 kg)   SpO2 98%   BMI 42.15 kg/m     Wt Readings from Last 3 Encounters:  02/02/22 215 lb 12.8 oz (97.9 kg)  12/03/21 216 lb 9.6 oz (98.2 kg)  12/01/21 220 lb 6.4 oz (100 kg)     GEN:  Well nourished, well developed in no acute distress HEENT: Normal NECK: No JVD; No carotid bruits CARDIAC: RRR, 2/6 systolic murmur RESPIRATORY:  Clear to auscultation without rales, wheezing or rhonchi  ABDOMEN: Soft, non-tender, non-distended MUSCULOSKELETAL:  No edema; No deformity  SKIN: Warm and dry NEUROLOGIC:  Alert and oriented x 3 PSYCHIATRIC:  Normal affect   ASSESSMENT:    1. Coronary artery calcification seen on CT scan   2. Medication management   3. Essential hypertension   4. Hyperlipidemia, unspecified hyperlipidemia type    5. Chronic diastolic heart failure (HCC)   6. Pulmonary hypertension, unspecified (HCC)    PLAN:    In order of problems listed above:  #Chronic Diastolic HF: TTE 19/5093 with LVF 60-65%. Currently euvolemic with NYHA class II symptoms. -Start spironolactone 12.79m daily -Check BMET next week -Did not tolerate SGLT2i due to dizziness and constipation  #CAD with Ca on CT chest: No anginal symptoms. -Continue ASA 863mdaily -Continue zetia 1055maily  #Pulmonary HTN: Likely due to OSA and HFpEF. Currently euvolemic as above -Continue CPAP -Continue BP control -Currently euvolemic -Continue serial monitoring  #HTN: Controlled at home.  -Continue metop 14m61mD  #HLD: LDL 101 in 2022. Statin intolerant now on zetia 10mg72mly. -Continue zetia 10mg 72my -Check lipids in 2 weeks          Medication Adjustments/Labs and Tests Ordered: Current medicines are reviewed at length with the patient today.  Concerns regarding medicines are outlined above.  Orders Placed This Encounter  Procedures   Basic metabolic panel   Lipid Profile   Meds ordered this encounter  Medications   DISCONTD: spironolactone (ALDACTONE) 25 MG tablet    Sig: Take 1 tablet (25 mg total) by mouth daily.    Dispense:  90 tablet    Refill:  2   spironolactone (ALDACTONE) 25 MG tablet    Sig: Take 0.5 tablets (12.5 mg total) by mouth daily.    Dispense:  45 tablet    Refill:  3    Patient Instructions  Medication Instructions:   START TAKING SPIRONOLACTONE 12.5 MG BY MOUTH DAILY  *If you need a refill on your cardiac medications before your next appointment, please call your pharmacy*   Lab Work:  IN 2 WEEKS HERE IN THE OFFICE--BMET AND LIPIDS--COME FASTING TO THIS LAB APPOINTMENT  If you have labs (blood work) drawn today and your tests are completely normal, you will receive your results only by: MyCharHopeou have MyChart) OR A paper copy in the mail If you have any lab  test that is abnormal or we need to change your treatment, we will call you to review the results.    Follow-Up: [facility].  As part of our continuing mission to provide you with exceptional heart care, we have created designated Provider Care Teams.  These Care Teams include your primary Cardiologist (physician) and Advanced Practice Providers (APPs -  Physician Assistants and Nurse Practitioners) who all work together to provide you with the care you need, when you need it.  We recommend signing up for the patient portal called "MyChart".  Sign up information is provided on this After Visit Summary.  MyChart is used to connect with patients for Virtual Visits (Telemedicine).  Patients are able to view lab/test results, encounter notes, upcoming appointments, etc.  Non-urgent messages can be sent to your provider as well.   To learn more about what you can do with MyChart, go to [email or web address] next appointment:   6 month(s)  Provider:   DR.  Ulysses Alper    Signed, Freada Bergeron, MD  02/02/2022 10:44 AM    [place]

## 2022-01-26 NOTE — Progress Notes (Unsigned)
Office Visit Note  Patient: Sydney Booth             Date of Birth: 04/18/1931           MRN: [id number]             PCP: Camillia Herter, NP Referring: Camillia Herter, NP Visit Date: 02/09/2022 Occupation: '@GUAROCC'$ @  Subjective:  Paresthesias and numbness in both feet   History of Present Illness: Sydney Booth is a 89 y.o. female with history of osteopenia and osteoarthritis.  Patient underwent NCV with EMG on 12/16/2021 which was consistent with findings of neuropathy.  She continues to have persistent paresthesias as well as diminished sensation in her feet.  She states that the neuropathy symptoms have been progressively worsening and extending up her lower legs.  She has not followed back up with neurology to discuss treatment options.  In the past she tried gabapentin and Cymbalta but discontinued due to intolerance.  She plans on following up with neurology for further evaluation and to discuss other treatment options.  According to the patient she was seen by her chiropractor in [place] and underwent an extensive treatment program ($8400 out of pocket cost) for 8 months but had no improvement in her symptoms of neuropathy or her circulation.  She denies any issues with balance at this time.  She has been using a cane to assist with ambulation.  She denies any recent falls.  She continues to take calcium and vitamin D supplement daily.  She is due to updated bone density in July 2024.   Activities of Daily Living:  Patient reports morning stiffness for 0 minutes.   Patient Denies nocturnal pain.  Difficulty dressing/grooming: Denies Difficulty climbing stairs: Reports Difficulty getting out of chair: Reports Difficulty using hands for taps, buttons, cutlery, and/or writing: Reports  Review of Systems  Constitutional:  Positive for fatigue.  HENT: Negative.  Negative for mouth sores and mouth dryness.   Eyes:  Positive for dryness.  Respiratory:  Positive for shortness  of breath.   Cardiovascular: Negative.  Negative for chest pain and palpitations.  Gastrointestinal:  Positive for constipation. Negative for blood in stool and diarrhea.  Endocrine: Negative.  Negative for increased urination.  Genitourinary: Negative.  Negative for involuntary urination.  Musculoskeletal:  Positive for joint pain, gait problem, joint pain, myalgias, muscle weakness and myalgias. Negative for joint swelling, morning stiffness and muscle tenderness.  Skin:  Negative for color change, rash, hair loss and sensitivity to sunlight.  Allergic/Immunologic: Negative.  Negative for susceptible to infections.  Neurological:  Positive for dizziness. Negative for headaches.  Hematological: Negative.  Negative for swollen glands.  Psychiatric/Behavioral:  Positive for sleep disturbance. Negative for depressed mood. The patient is not nervous/anxious.     PMFS History:  Patient Active Problem List   Diagnosis Date Noted   Prediabetes 09/30/2021   History of adenomatous polyp of colon 12/16/2020   Anxiety 12/16/2020   Decreased estrogen level 12/16/2020   Family history of colon cancer 12/16/2020   Major depression 12/16/2020   Moderate major depression, single episode ([place]) 12/16/2020   Nightmares 12/16/2020   Pure hypercholesterolemia 12/16/2020   History of skin cancer 08/21/2020   Milia 08/21/2020   Neoplasm of uncertain behavior of skin 08/21/2020   Purpura ([place]) 08/21/2020   Seborrheic keratosis 08/21/2020   Abnormal CXR 04/01/2020   Carpal tunnel syndrome of left wrist 06/08/2018   Polyneuropathy    Syncope    Trigger finger,  left index finger 06/04/2016   Impingement syndrome, shoulder, left 10/31/2015   Balance problem 04/11/2015   Neck pain 04/11/2015   Chronic pain of both shoulders 12/20/2014   Status post total left knee replacement 12/20/2014   Benign paroxysmal positional vertigo 09/12/2014   Neuropathic pain of foot 09/12/2014   Rotator cuff syndrome  06/28/2014   Dysfunction of both eustachian tubes 05/30/2014   Blurred vision 11/24/2013   Vitamin D deficiency 03/08/2013   Obstructive sleep apnea syndrome 02/14/2013   Hyperlipidemia 02/09/2013   Arthritis of knee 03/24/2012   Presence of unspecified artificial knee joint 03/24/2012   Dyslipidemia 03/02/2012   Gastroesophageal reflux disease without esophagitis 03/02/2012   Mitral valve prolapse 03/02/2012   Morbid obesity ([place]) 01/27/2012   Primary osteoarthritis of right knee 03/24/2011    Class: Chronic   Hypertension 03/24/2011    Class: Chronic    Past Medical History:  Diagnosis Date   Arthritis    Blood transfusion    Cervical vertebral fracture (HCC)    fued on its on   Closed head injury    as a chlid   Coronary artery calcification seen on CT scan    Fall 12/22/2017   right eye bruised   Gastric ulcer    GERD (gastroesophageal reflux disease)    H/O hiatal hernia    Headache(784.0)    Hyperlipemia    Hypertension    Left carpal tunnel syndrome 06/08/2018   Mild pulmonary hypertension (HCC)    Mitral regurgitation    Mitral stenosis    Mitral valve prolapse    Obesity    OSA on CPAP    Osteopenia    Rotator cuff tear    Left   Seasonal allergies    Shortness of breath    With activity   Ulcer 1991   Bledding     Family History  Problem Relation Age of Onset   Stroke Mother    Cancer Father        COLON, LIVER AND PANCREAS   Heart disease Brother    Sleep apnea Brother    Sleep apnea Brother    Pulmonary embolism Brother    ALS Son    Tourette syndrome Son    Healthy Daughter    Autism Grandson    Anesthesia problems Neg Hx    Breast cancer Neg Hx    Past Surgical History:  Procedure Laterality Date   BIospy      Biospy      Uterus wall   Carparal Tunnel     CATARACT EXTRACTION     Colonscopy     EYE SURGERY     Right 2011, Left 2008   LIPOMA EXCISION     FROM BACK    THUMB FUSION     TOTAL KNEE ARTHROPLASTY  03/24/2011    Procedure: TOTAL KNEE ARTHROPLASTY;  Surgeon: Hessie Dibble, MD;  Location: [facility];  Service: Orthopedics;  Laterality: Left;  with revision tibial component   TUBAL LIGATION     Social History   Social History Narrative   Patient lives at home with daughter   Patient is retired   Patient is widowed   Patient has a high school education   Patient has 5 children    Immunization History  Administered Date(s) Administered   Influenza Split 10/10/2008, 10/10/2009, 11/16/2010   PFIZER(Purple Top)SARS-COV-2 Vaccination 03/31/2019, 04/21/2019, 12/15/2019, 06/19/2020   Pneumococcal Conjugate-13 11/17/2013   Pneumococcal Polysaccharide-23 12/11/1998, 01/06/2007, 06/17/2016   Td  12/11/2007   Tdap 05/18/2012, 12/22/2017, 06/19/2020   Zoster Recombinat (Shingrix) 08/21/2020, 09/29/2021   Zoster, Live 05/18/2012, 06/15/2019     Objective: Vital Signs: BP (!) 166/104 (BP Location: Left Arm, Patient Position: Sitting, Cuff Size: Normal)   Pulse 70   Resp 16   Ht 5' (1.524 m)   Wt 219 lb (99.3 kg)   BMI 42.77 kg/m    Physical Exam Vitals and nursing note reviewed.  Constitutional:      Appearance: She is well-developed.  HENT:     Head: Normocephalic and atraumatic.  Eyes:     Conjunctiva/sclera: Conjunctivae normal.  Cardiovascular:     Rate and Rhythm: Normal rate and regular rhythm.     Heart sounds: Normal heart sounds.  Pulmonary:     Effort: Pulmonary effort is normal.     Breath sounds: Normal breath sounds.  Abdominal:     General: Bowel sounds are normal.     Palpations: Abdomen is soft.  Musculoskeletal:     Cervical back: Normal range of motion.  Lymphadenopathy:     Cervical: No cervical adenopathy.  Skin:    General: Skin is warm and dry.     Capillary Refill: Capillary refill takes less than 2 seconds.  Neurological:     Mental Status: She is alert and oriented to person, place, and time.  Psychiatric:        Behavior: Behavior normal.       Musculoskeletal Exam: Patient remained seated during examination today.  C-spine has good range of motion.  Thoracic kyphosis noted.  Discomfort and stiffness with range of motion of both shoulder joints.  Limited internal rotation of both shoulders.  Elbow joints, wrist joints, MCPs, PIPs, DIPs have good range of motion with no synovitis.  Complete fist formation bilaterally.  CMC joint prominence noted bilaterally.  Some PIP and DIP thickening consistent with osteoarthritis of both hands.  Hip joints have good range of motion with no groin pain.  Right knee joint has good ROM with discomfort.  Left knee replacement has good ROM.  Ankle joints have good ROM with no tenderness or joint swelling.  Pedal edema noted in bilateral LE.  No evidence of achilles tendonitis or plantar fasciitis.  PIP and DIP thickening consistent with osteoarthritis of both feet.   CDAI Exam: CDAI Score: -- Patient Global: --; Provider Global: -- Swollen: --; Tender: -- Joint Exam 02/09/2022   No joint exam has been documented for this visit   There is currently no information documented on the homunculus. Go to the Rheumatology activity and complete the homunculus joint exam.  Investigation: No additional findings.  Imaging: No results found.  Recent Labs: Lab Results  Component Value Date   WBC 4.2 09/29/2021   HGB 13.1 09/29/2021   PLT 286 09/29/2021   NA 137 10/20/2021   K 4.7 10/20/2021   CL 97 10/20/2021   CO2 25 10/20/2021   GLUCOSE 90 10/20/2021   BUN 11 10/20/2021   CREATININE 0.78 10/20/2021   BILITOT 0.5 09/29/2021   ALKPHOS 124 (H) 09/29/2021   AST 21 09/29/2021   ALT 10 09/29/2021   PROT 7.0 10/13/2021   ALBUMIN 4.1 09/29/2021   CALCIUM 9.9 10/20/2021   GFRAA 80 02/06/2020    Speciality Comments: No specialty comments available.  Procedures:  No procedures performed Allergies: Cymbalta [duloxetine hcl], Farxiga [dapagliflozin], Gabapentin, Statins, Sulfa antibiotics, and Zocor  [simvastatin]   Assessment / Plan:     Visit Diagnoses: Osteopenia of multiple  sites - DEXA 07/10/20: The BMD measured at Forearm Radius 33% is 0.678 g/cm2 with a T-scoreof -2.4.  No history of hip or vertebral fractures.  Using a cane to assist with ambulation.  No recent falls.  With her new diagnosis of neuropathy discussed that I would focus on improving lower extremity muscle strengthening and balance for fall prevention.   Due to update DEXA in July 2024 She is taking a calcium and vitamin D supplement daily.   Vitamin D deficiency: Vitamin D was 86 on 10/13/2021.  She is taking a vitamin D and calcium supplement daily.  Impingement syndrome, shoulder, left - Followed by Dr. Rip Harbour at [facility].  Good range of motion with some discomfort and stiffness during the exam today.  Primary osteoarthritis of right knee: She continues to experience intermittent pain and stiffness in the right knee joint.  She has tried Visco gel injections in the past with no improvement in her symptoms.  She has been using a cane to assist with ambulation.  Discussed the importance of lower extremity muscle strengthening.  Status post total left knee replacement: Doing well.  Good range of motion with no warmth or effusion.  Paresthesia of both feet - Referral to neurology for NCV with EMG was previously placed.  NCV with EMG on 12/16/2021 was reviewed-results consistent with polyneuropathy affecting the lower extremities.  The patient has previously had an intolerance to gabapentin and Cymbalta in the past.  She was evaluated by a chiropractor in [place] and had to pay out-of-pocket $8400 for a treatment plan.  According to the patient she tried the treatment plan for 8 months but had 0% improvement in her symptoms of neuropathy.  She was advised to follow-up with her neurologist for further evaluation and management.  She was strongly encouraged to continue to use a cane to assist with ambulation as  well as to work on lower extremity muscle strengthening for fall prevention.  Primary osteoarthritis of both feet - X-rays of both feet were consistent with osteoarthritis and possible inflammatory arthritis on 10/13/2021.  ESR and CRP WNL.  RF and anti-CCP negative.  She has no joint tenderness or synovitis on examination today.  She was advised to notify us if she develops increased joint pain or joint swelling.  Trigger finger, left index finger: Not currently active.  Carpal tunnel syndrome of left wrist: She experiences intermittent paresthesias in the left hand in the distribution of the median nerve.  She has tried wearing a carpal tunnel splint at night but states that her symptoms are worse while wearing the brace.  Polyneuropathy: Advised patient to follow-up with neurology to discuss treatment options.  Other medical conditions are listed as follows:  Gastroesophageal reflux disease without esophagitis  Obstructive sleep apnea syndrome  Primary hypertension: Blood pressure was elevated today in the office at 166/104.  She was advised to monitor her blood pressure closely.  Pure hypercholesterolemia  Prediabetes  Moderate major depression, single episode (HCC)  History of skin cancer  Mitral valve prolapse  Family history of colon cancer  Seborrheic keratosis  Orders: No orders of the defined types were placed in this encounter.  No orders of the defined types were placed in this encounter.    Follow-Up Instructions: Return if symptoms worsen or fail to improve, for Osteoarthritis.   Ofilia Neas, PA-C  Note - This record has been created using Dragon software.  Chart creation errors have been sought, but may not always  have been located.  Such creation errors do not reflect on  the standard of medical care.

## 2022-02-02 ENCOUNTER — Encounter: Payer: Self-pay | Admitting: Cardiology

## 2022-02-02 ENCOUNTER — Other Ambulatory Visit: Payer: Self-pay

## 2022-02-02 ENCOUNTER — Ambulatory Visit: Payer: Medicare Other | Attending: Cardiology | Admitting: Cardiology

## 2022-02-02 VITALS — BP 176/80 | HR 69 | Ht 60.0 in | Wt 215.8 lb

## 2022-02-02 DIAGNOSIS — I5032 Chronic diastolic (congestive) heart failure: Secondary | ICD-10-CM | POA: Insufficient documentation

## 2022-02-02 DIAGNOSIS — I2510 Atherosclerotic heart disease of native coronary artery without angina pectoris: Secondary | ICD-10-CM | POA: Insufficient documentation

## 2022-02-02 DIAGNOSIS — Z79899 Other long term (current) drug therapy: Secondary | ICD-10-CM | POA: Diagnosis present

## 2022-02-02 DIAGNOSIS — I272 Pulmonary hypertension, unspecified: Secondary | ICD-10-CM | POA: Diagnosis present

## 2022-02-02 DIAGNOSIS — I10 Essential (primary) hypertension: Secondary | ICD-10-CM | POA: Diagnosis present

## 2022-02-02 DIAGNOSIS — E785 Hyperlipidemia, unspecified: Secondary | ICD-10-CM | POA: Diagnosis present

## 2022-02-02 MED ORDER — SPIRONOLACTONE 25 MG PO TABS: 12.5000 mg | ORAL_TABLET | Freq: Every day | ORAL | 3 refills | Status: DC

## 2022-02-02 MED ORDER — SPIRONOLACTONE 25 MG PO TABS: 25.0000 mg | ORAL_TABLET | Freq: Every day | ORAL | 2 refills | Status: DC

## 2022-02-02 NOTE — Patient Instructions (Addendum)
Medication Instructions:   START TAKING SPIRONOLACTONE 12.5 MG BY MOUTH DAILY  *If you need a refill on your cardiac medications before your next appointment, please call your pharmacy*   Lab Work:  IN 2 WEEKS HERE IN THE OFFICE--BMET AND LIPIDS--COME FASTING TO THIS LAB APPOINTMENT  If you have labs (blood work) drawn today and your tests are completely normal, you will receive your results only by: [place] (if you have MyChart) OR A paper copy in the mail If you have any lab test that is abnormal or we need to change your treatment, we will call you to review the results.    Follow-Up: At [facility], you and your health needs are our priority.  As part of our continuing mission to provide you with exceptional heart care, we have created designated Provider Care Teams.  These Care Teams include your primary Cardiologist (physician) and Advanced Practice Providers (APPs -  Physician Assistants and Nurse Practitioners) who all work together to provide you with the care you need, when you need it.  We recommend signing up for the patient portal called "MyChart".  Sign up information is provided on this After Visit Summary.  MyChart is used to connect with patients for Virtual Visits (Telemedicine).  Patients are able to view lab/test results, encounter notes, upcoming appointments, etc.  Non-urgent messages can be sent to your provider as well.   To learn more about what you can do with MyChart, go to [email or web address].    Your next appointment:   6 month(s)  Provider:   DR. Johney Frame

## 2022-02-09 ENCOUNTER — Ambulatory Visit: Payer: Medicare Other | Attending: Physician Assistant | Admitting: Physician Assistant

## 2022-02-09 ENCOUNTER — Telehealth: Payer: Self-pay | Admitting: *Deleted

## 2022-02-09 ENCOUNTER — Encounter: Payer: Self-pay | Admitting: Physician Assistant

## 2022-02-09 VITALS — BP 166/104 | HR 70 | Resp 16 | Ht 60.0 in | Wt 219.0 lb

## 2022-02-09 DIAGNOSIS — G5602 Carpal tunnel syndrome, left upper limb: Secondary | ICD-10-CM | POA: Diagnosis present

## 2022-02-09 DIAGNOSIS — G4733 Obstructive sleep apnea (adult) (pediatric): Secondary | ICD-10-CM | POA: Diagnosis present

## 2022-02-09 DIAGNOSIS — L821 Other seborrheic keratosis: Secondary | ICD-10-CM | POA: Diagnosis present

## 2022-02-09 DIAGNOSIS — I10 Essential (primary) hypertension: Secondary | ICD-10-CM | POA: Insufficient documentation

## 2022-02-09 DIAGNOSIS — E7800 Pure hypercholesterolemia, unspecified: Secondary | ICD-10-CM | POA: Insufficient documentation

## 2022-02-09 DIAGNOSIS — Z96652 Presence of left artificial knee joint: Secondary | ICD-10-CM | POA: Diagnosis present

## 2022-02-09 DIAGNOSIS — E559 Vitamin D deficiency, unspecified: Secondary | ICD-10-CM | POA: Insufficient documentation

## 2022-02-09 DIAGNOSIS — K219 Gastro-esophageal reflux disease without esophagitis: Secondary | ICD-10-CM | POA: Insufficient documentation

## 2022-02-09 DIAGNOSIS — Z85828 Personal history of other malignant neoplasm of skin: Secondary | ICD-10-CM | POA: Diagnosis present

## 2022-02-09 DIAGNOSIS — M19071 Primary osteoarthritis, right ankle and foot: Secondary | ICD-10-CM | POA: Insufficient documentation

## 2022-02-09 DIAGNOSIS — M1711 Unilateral primary osteoarthritis, right knee: Secondary | ICD-10-CM | POA: Insufficient documentation

## 2022-02-09 DIAGNOSIS — F321 Major depressive disorder, single episode, moderate: Secondary | ICD-10-CM | POA: Diagnosis present

## 2022-02-09 DIAGNOSIS — G629 Polyneuropathy, unspecified: Secondary | ICD-10-CM | POA: Diagnosis present

## 2022-02-09 DIAGNOSIS — M65322 Trigger finger, left index finger: Secondary | ICD-10-CM | POA: Insufficient documentation

## 2022-02-09 DIAGNOSIS — R7303 Prediabetes: Secondary | ICD-10-CM | POA: Diagnosis present

## 2022-02-09 DIAGNOSIS — I341 Nonrheumatic mitral (valve) prolapse: Secondary | ICD-10-CM | POA: Insufficient documentation

## 2022-02-09 DIAGNOSIS — Z800 Family history of malignant neoplasm of digestive organs: Secondary | ICD-10-CM | POA: Diagnosis present

## 2022-02-09 DIAGNOSIS — R202 Paresthesia of skin: Secondary | ICD-10-CM | POA: Insufficient documentation

## 2022-02-09 DIAGNOSIS — M8589 Other specified disorders of bone density and structure, multiple sites: Secondary | ICD-10-CM | POA: Insufficient documentation

## 2022-02-09 DIAGNOSIS — M7542 Impingement syndrome of left shoulder: Secondary | ICD-10-CM | POA: Diagnosis present

## 2022-02-09 DIAGNOSIS — M19072 Primary osteoarthritis, left ankle and foot: Secondary | ICD-10-CM | POA: Insufficient documentation

## 2022-02-09 NOTE — Patient Instructions (Addendum)
Dr. Posey Pronto   [phone]

## 2022-02-09 NOTE — Telephone Encounter (Signed)
Patient contacted the office and left message stating she was in the office for an appointment this morning. She states her blood pressure was elevated. Patient states she rechecked it at home. She states when she rechecked her blood pressure it was 116/64.

## 2022-02-11 ENCOUNTER — Encounter: Payer: Self-pay | Admitting: Neurology

## 2022-02-11 ENCOUNTER — Telehealth: Payer: Self-pay

## 2022-02-11 DIAGNOSIS — G629 Polyneuropathy, unspecified: Secondary | ICD-10-CM

## 2022-02-11 NOTE — Telephone Encounter (Signed)
Referral placed. I called patient to advise.

## 2022-02-11 NOTE — Telephone Encounter (Signed)
Patient left a  message stating she called Dr. Serita Grit office to schedule an appointment after seeing Lovena Le on 02/09/2022 and was advised she needed a referral.   Please review and sign pended neuro referral. Thanks!

## 2022-02-11 NOTE — Telephone Encounter (Signed)
Ok to place neurology referral for management of neuropathy

## 2022-02-16 ENCOUNTER — Ambulatory Visit (INDEPENDENT_AMBULATORY_CARE_PROVIDER_SITE_OTHER): Payer: Medicare Other | Admitting: Neurology

## 2022-02-16 ENCOUNTER — Encounter: Payer: Self-pay | Admitting: Neurology

## 2022-02-16 VITALS — BP 167/71 | HR 80 | Ht 60.0 in | Wt 217.0 lb

## 2022-02-16 DIAGNOSIS — M5417 Radiculopathy, lumbosacral region: Secondary | ICD-10-CM | POA: Diagnosis not present

## 2022-02-16 DIAGNOSIS — G609 Hereditary and idiopathic neuropathy, unspecified: Secondary | ICD-10-CM

## 2022-02-16 DIAGNOSIS — I2510 Atherosclerotic heart disease of native coronary artery without angina pectoris: Secondary | ICD-10-CM | POA: Diagnosis not present

## 2022-02-16 NOTE — Patient Instructions (Signed)
Check your feet every day  Take extra caution on uneven ground, always use a walker  Return to clinic as needed

## 2022-02-16 NOTE — Progress Notes (Signed)
[facility] Clinic Note - Initial Visit   Date: 02/16/2022   Sydney Booth MRN: [id number] DOB: 1931/12/15   Dear Sydney Sams, PA-C:  Thank you for your kind referral of Sydney Booth for consultation of neuropathy. Although her history is well known to you, please allow Sydney Booth to reiterate it for the purpose of our medical record. The patient was accompanied to the clinic by daughter who also provides collateral information.     Sydney Booth is a 89 y.o. right-handed female with GERD, hypertension, hyperlipidemia, and lumbar spinal canal stenosis presenting for evaluation of neuropathy.   IMPRESSION/PLAN: Idiopathic neuropathy affecting the feet.  Her neurological examination shows a distal predominant large fiber peripheral neuropathy. I had extensive discussion with the patient regarding the pathogenesis, etiology, management, and natural course of neuropathy. Neuropathy tends to be slowly progressive, especially if a treatable etiology is not identified. She is not diabetic, no history of alcohol abuse, and no family history of neuropathy.  Neuropathy is most likely degenerative.  There is no cure or treatment to reverse neuropathy. She was advised against seeking alternative treatment which claim to cure neuropathy.  = Management is symptomatic.  Fortunately, she does not have any pain.  Medications are ineffective for numbness, which she endorses.  Patient educated on daily foot inspection, fall prevention, and safety precautions around the home.  She also has known lumbar canal and lateral recess stenosis, severe at L4-5.  This may also contribute to weakness, paresthesias, and low back pain.  She does not have neurogenic claudication.  She is seeing orthopeadics for this.  Return to clinic as needed  ------------------------------------------------------------- History of present illness: Starting a few years ago, she was having numbness in the left  foot which then involved the right foot.  Numbness is constant and extended into the lower legs.  She has no weakness.  She has chronic low back pain which was exacerbated after ESI.  She endorses imbalance and walks with a cane and walker.  No history of diabetes, alcoholism, chemotherapy, or family history of neuropathy.  She was seeing a chiropractor who claimed to cure neuropathy and spent $8400 on various therapies, however, did not have any benefit.  She had NCS/EMG here in December which shows sensorimotor axonal polyneuropathy and superimposed bilateral L3-S1 radiculopathies.  MRI lumbar spine from 11/2021 confirmed the presence of multilevel degenerative changes with ssonal canal stenosis at L4-5, L3-4 and lateral recess stenosis at nearly all the levels on the right (see below).   Out-side paper records, electronic medical record, and images have been reviewed where available and summarized as:  NCS/EMG of the legs 12/16/2021: The electrophysiologic findings are consistent with a chronic and symmetric sensorimotor axonal polyneuropathy affecting the lower extremities. Probable superimposed multilevel chronic radiculopathies affecting bilateral L3-S1 nerve root/segments.   MRI lumbar spine 11/26/2021: 1. L4-L5 moderate to severe spinal canal stenosis, primarily due to focal buckling of the ligamentum flavum, and mild left neural foraminal narrowing. Effacement of the left lateral recess at this level compresses the descending left L5 nerve roots. 2. L3-L4 mild-to-moderate spinal canal stenosis with moderate right and mild left neural foraminal narrowing. Effacement of the left lateral recess at this level compresses the descending left L4 nerve roots. 3. L5-S1 mild spinal canal stenosis with moderate left and mild right neural foraminal narrowing. 4. L2-L3 mild spinal canal stenosis. 5. Narrowing of the right lateral recess at T12-L1, L1-L2, L3-L4, and L4-L5, which could affect the descending  right  L1, L2, L4, and L5 nerve roots, respectively. Narrowing the bilateral lateral recesses at L2-L3 could affect the descending L3 nerve roots. 6. Multilevel facet arthropathy, which is severe at L5-S1, which can also be a cause of back pain.  Lab Results  Component Value Date   HGBA1C 5.7 (H) 09/29/2021   Lab Results  Component Value Date   VITAMINB12 901 06/08/2018   Lab Results  Component Value Date   TSH 2.160 09/29/2021   Lab Results  Component Value Date   ESRSEDRATE 17 10/13/2021    Past Medical History:  Diagnosis Date   Arthritis    Blood transfusion    Cervical vertebral fracture (HCC)    fued on its on   Closed head injury    as a chlid   Coronary artery calcification seen on CT scan    Fall 12/22/2017   right eye bruised   Gastric ulcer    GERD (gastroesophageal reflux disease)    H/O hiatal hernia    Headache(784.0)    Hyperlipemia    Hypertension    Left carpal tunnel syndrome 06/08/2018   Mild pulmonary hypertension (HCC)    Mitral regurgitation    Mitral stenosis    Mitral valve prolapse    Obesity    OSA on CPAP    Osteopenia    Rotator cuff tear    Left   Seasonal allergies    Shortness of breath    With activity   Ulcer 1991   [place]     Past Surgical History:  Procedure Laterality Date   BIospy      Biospy      Uterus wall   [place] Tunnel     CATARACT EXTRACTION     Colonscopy     EYE SURGERY     Right 2011, Left 2008   LIPOMA EXCISION     FROM BACK    THUMB FUSION     TOTAL KNEE ARTHROPLASTY  03/24/2011   Procedure: TOTAL KNEE ARTHROPLASTY;  Surgeon: Hessie Dibble, MD;  Location: [facility];  Service: Orthopedics;  Laterality: Left;  with revision tibial component   TUBAL LIGATION       Medications:  Outpatient Encounter Medications as of 02/16/2022  Medication Sig   acetaminophen (TYLENOL) 325 MG tablet Take 2 tablets (650 mg total) by mouth every 6 (six) hours as needed for moderate pain.   Ascorbic Acid (VITAMIN C  PO) Take 1,000 mg by mouth in the morning and at bedtime.   aspirin 81 MG tablet Take 81 mg by mouth daily.   calcium carbonate (OS-CAL) 600 MG TABS Take 600 mg by mouth daily.   CINNAMON PO Take 500 mg by mouth 2 (two) times daily.   ezetimibe (ZETIA) 10 MG tablet Take 1 tablet (10 mg total) by mouth daily.   metoprolol tartrate (LOPRESSOR) 25 MG tablet Take 1 tablet (25 mg total) by mouth 2 (two) times daily.   Omega-3 Fatty Acids (FISH OIL) 1000 MG CPDR Take 1,000 mg by mouth daily.   omeprazole (PRILOSEC) 40 MG capsule Take 1 capsule (40 mg total) by mouth daily.   QUERCETIN PO Take 1,500 mg by mouth daily.   spironolactone (ALDACTONE) 25 MG tablet Take 0.5 tablets (12.5 mg total) by mouth daily.   Turmeric 500 MG CAPS Take by mouth daily.   VITAMIN A PO Take 10,000 mg by mouth.   vitamin B-12 (CYANOCOBALAMIN) 1000 MCG tablet Take 1,000 mcg by mouth daily.   VITAMIN D,  CHOLECALCIFEROL, PO Take 1 tablet by mouth daily.   Zinc Acetate, Oral, (ZINC ACETATE PO) Take 1 tablet by mouth.   Zinc Oxide 40 % PSTE Apply 1 application topically as needed.   No facility-administered encounter medications on file as of 02/16/2022.    Allergies:  Allergies  Allergen Reactions   Cymbalta [Duloxetine Hcl]    Farxiga [Dapagliflozin]     Hoarsness/Dizziness   Gabapentin     dizzy   Statins Other (See Comments)   Sulfa Antibiotics Hives, Other (See Comments) and Nausea And Vomiting   Zocor [Simvastatin]     Muscle aches    Family History: Family History  Problem Relation Age of Onset   Stroke Mother    Cancer Father        COLON, LIVER AND PANCREAS   Heart disease Brother    Sleep apnea Brother    Sleep apnea Brother    Pulmonary embolism Brother    ALS Son    Tourette syndrome Son    Healthy Daughter    Autism Grandson    Anesthesia problems Neg Hx    Breast cancer Neg Hx     Social History: Social History   Tobacco Use   Smoking status: Never    Passive exposure: Never    Smokeless tobacco: Never  Vaping Use   Vaping Use: Never used  Substance Use Topics   Alcohol use: No    Alcohol/week: 0.0 standard drinks of alcohol   Drug use: No   Social History   Social History Narrative   Patient lives at home with daughter   Patient is retired   Patient is widowed   Patient has a high school education   Patient has 5 children       Right Handed    Lives in a two story home    Vital Signs:  BP (!) 167/71   Pulse 80   Ht 5' (1.524 m)   Wt 217 lb (98.4 kg)   SpO2 99%   BMI 42.38 kg/m   Neurological Exam: MENTAL STATUS including orientation to time, place, person, recent and remote memory, attention span and concentration, language, and fund of knowledge is normal.  Speech is not dysarthric.  CRANIAL NERVES: II:  No visual field defects.     III-IV-VI: Pupils equal round and reactive to light.  Normal conjugate, extra-ocular eye movements in all directions of gaze.  No nystagmus.  No ptosis.   V:  Normal facial sensation.    VII:  Normal facial symmetry and movements.   VIII:  Normal hearing and vestibular function.   IX-X:  Normal palatal movement.   XI:  Normal shoulder shrug and head rotation.   XII:  Normal tongue strength and range of motion, no deviation or fasciculation.  MOTOR:  No atrophy, fasciculations or abnormal movements.  No pronator drift.   Upper Extremity:  Right  Left  Deltoid  5/5   5/5   Biceps  5/5   5/5   Triceps  5/5   5/5   Wrist extensors  5/5   5/5   Wrist flexors  5/5   5/5   Finger extensors  5/5   5/5   Finger flexors  5/5   5/5   Dorsal interossei  5/5   5/5   Abductor pollicis  5/5   5/5   Tone (Ashworth scale)  0  0   Lower Extremity:  Right  Left  Hip flexors  5/5  5/5   Abductor 5/5  5/5  Knee flexors  5/5   5/5   Knee extensors  5/5   5/5   Dorsiflexors  5/5   5/5   Plantarflexors  5/5   5/5   Toe extensors  5/5   5/5   Toe flexors  5/5   5/5   Tone (Ashworth scale)  0  0   MSRs:                                            Right        Left brachioradialis 2+  2+  biceps 2+  2+  triceps 2+  2+  patellar 2+  2+  ankle jerk 0  0  Hoffman no  no  plantar response down  down   SENSORY:  Vibration, temperature, and pin prick reduced in the feet.    COORDINATION/GAIT: Normal finger-to- nose-finger.  Intact rapid alternating movements bilaterally.  Gait is assisted with cane, mildly wide-based, stable. .   Total time spent reviewing records, interview, history/exam, documentation, and coordination of care on day of encounter:  40 min   Thank you for allowing me to participate in patient's care.  If I can answer any additional questions, I would be pleased to do so.    Sincerely,    Kyira Volkert K. Posey Pronto, DO

## 2022-02-23 ENCOUNTER — Ambulatory Visit: Payer: Medicare Other | Attending: Cardiology

## 2022-02-23 DIAGNOSIS — I10 Essential (primary) hypertension: Secondary | ICD-10-CM

## 2022-02-23 DIAGNOSIS — I5032 Chronic diastolic (congestive) heart failure: Secondary | ICD-10-CM

## 2022-02-23 DIAGNOSIS — I2510 Atherosclerotic heart disease of native coronary artery without angina pectoris: Secondary | ICD-10-CM

## 2022-02-23 DIAGNOSIS — Z79899 Other long term (current) drug therapy: Secondary | ICD-10-CM

## 2022-02-23 DIAGNOSIS — E785 Hyperlipidemia, unspecified: Secondary | ICD-10-CM

## 2022-02-23 LAB — BASIC METABOLIC PANEL
BUN/Creatinine Ratio: 15 (ref 12–28)
BUN: 13 mg/dL (ref 8–27)
CO2: 24 mmol/L (ref 20–29)
Calcium: 10 mg/dL (ref 8.7–10.3)
Chloride: 94 mmol/L — ABNORMAL LOW (ref 96–106)
Creatinine, Ser: 0.84 mg/dL (ref 0.57–1.00)
Glucose: 98 mg/dL (ref 70–99)
Potassium: 4.2 mmol/L (ref 3.5–5.2)
Sodium: 130 mmol/L — ABNORMAL LOW (ref 134–144)
eGFR: 67 mL/min/{1.73_m2} (ref >59)

## 2022-02-23 LAB — LIPID PANEL
Chol/HDL Ratio: 3 ratio (ref 0.0–4.4)
Cholesterol, Total: 185 mg/dL (ref 100–199)
HDL: 62 mg/dL (ref >39)
LDL Chol Calc (NIH): 103 mg/dL — ABNORMAL HIGH (ref 0–99)
Triglycerides: 114 mg/dL (ref 0–149)
VLDL Cholesterol Cal: 20 mg/dL (ref 5–40)

## 2022-02-25 ENCOUNTER — Telehealth: Payer: Self-pay | Admitting: Cardiology

## 2022-02-25 DIAGNOSIS — Z79899 Other long term (current) drug therapy: Secondary | ICD-10-CM

## 2022-02-25 DIAGNOSIS — E871 Hypo-osmolality and hyponatremia: Secondary | ICD-10-CM

## 2022-02-25 NOTE — Telephone Encounter (Signed)
The patient has been notified of the result and verbalized understanding.  All questions (if any) were answered.  Pt states she did not eat at the time she had these labs drawn.  She confirmed she was fasting.  Pt aware that given she didn't eat, Dr. Johney Frame wants her to come into the office for a repeat BMET next week, but she should eat her regular meals, so that we can reassess her sodium level at that time.   Scheduled the pt for repeat bmet for next Monday 2/26.  She is aware that she will need to eat regular meals on the day of this lab.  Pt verbalized understanding and agrees with this plan.  Will make Dr. Johney Frame aware of all of this.

## 2022-02-25 NOTE — Telephone Encounter (Signed)
-----   Message from Freada Bergeron, MD sent at 02/23/2022  7:11 PM EST ----- Her sodium dropped slightly with the spironolactone. Did she eat at the time of labs? If so, we will need to hold the spironolactone. If not, can we just ensure she has a regular meal and repeats labs next week to see where sodium is. LDL 103. Will continue current therapy as she did not tolerate statins.

## 2022-02-25 NOTE — Telephone Encounter (Signed)
Pt returning call for lab results, pt states if she does not answer, its okay to LVM or send letter

## 2022-03-02 ENCOUNTER — Ambulatory Visit: Payer: Medicare Other | Attending: Cardiology

## 2022-03-02 DIAGNOSIS — Z79899 Other long term (current) drug therapy: Secondary | ICD-10-CM

## 2022-03-02 DIAGNOSIS — E871 Hypo-osmolality and hyponatremia: Secondary | ICD-10-CM

## 2022-03-03 ENCOUNTER — Telehealth: Payer: Self-pay | Admitting: *Deleted

## 2022-03-03 DIAGNOSIS — E871 Hypo-osmolality and hyponatremia: Secondary | ICD-10-CM

## 2022-03-03 DIAGNOSIS — Z79899 Other long term (current) drug therapy: Secondary | ICD-10-CM

## 2022-03-03 LAB — BASIC METABOLIC PANEL
BUN/Creatinine Ratio: 12 (ref 12–28)
BUN: 9 mg/dL (ref 8–27)
CO2: 22 mmol/L (ref 20–29)
Calcium: 9.6 mg/dL (ref 8.7–10.3)
Chloride: 94 mmol/L — ABNORMAL LOW (ref 96–106)
Creatinine, Ser: 0.75 mg/dL (ref 0.57–1.00)
Glucose: 93 mg/dL (ref 70–99)
Potassium: 5.1 mmol/L (ref 3.5–5.2)
Sodium: 131 mmol/L — ABNORMAL LOW (ref 134–144)
eGFR: 77 mL/min/{1.73_m2} (ref >59)

## 2022-03-03 NOTE — Telephone Encounter (Signed)
-----   Message from Freada Bergeron, MD sent at 03/03/2022 10:16 AM EST ----- Her Na remains low. We are going to need to hold the spironolactone unfortunately. Can repeat BMET in 2 weeks to ensure back to normal.

## 2022-03-03 NOTE — Telephone Encounter (Signed)
The patient has been notified of the result and verbalized understanding.  All questions (if any) were answered.  Pt aware to hold her spironolactone and come in for repeat BMET in 2 weeks to ensure sodium levels come back to normal.   Hold note placed on spironolactone in her med list and until repeat BMET is done in 2 weeks on 03/16/22, to reassess sodium level at that time.   Repeat BMET in 2 weeks scheduled for 03/16/22.  Pt verbalized understanding and agrees with this plan.

## 2022-03-11 ENCOUNTER — Ambulatory Visit: Payer: Medicare Other

## 2022-03-16 ENCOUNTER — Ambulatory Visit: Payer: Medicare Other | Attending: Cardiology

## 2022-03-16 ENCOUNTER — Ambulatory Visit: Payer: Medicare Other | Admitting: Neurology

## 2022-03-16 DIAGNOSIS — E871 Hypo-osmolality and hyponatremia: Secondary | ICD-10-CM

## 2022-03-16 DIAGNOSIS — Z79899 Other long term (current) drug therapy: Secondary | ICD-10-CM

## 2022-03-16 LAB — BASIC METABOLIC PANEL
BUN/Creatinine Ratio: 23 (ref 12–28)
BUN: 14 mg/dL (ref 8–27)
CO2: 25 mmol/L (ref 20–29)
Calcium: 9.6 mg/dL (ref 8.7–10.3)
Chloride: 95 mmol/L — ABNORMAL LOW (ref 96–106)
Creatinine, Ser: 0.62 mg/dL (ref 0.57–1.00)
Glucose: 91 mg/dL (ref 70–99)
Potassium: 4.8 mmol/L (ref 3.5–5.2)
Sodium: 132 mmol/L — ABNORMAL LOW (ref 134–144)
eGFR: 86 mL/min/{1.73_m2} (ref >59)

## 2022-03-24 ENCOUNTER — Ambulatory Visit
Admission: RE | Admit: 2022-03-24 | Discharge: 2022-03-24 | Disposition: A | Discharge status: Home / Self Care | Payer: Medicare Other | Source: Ambulatory Visit | Attending: Family | Admitting: Family

## 2022-03-24 DIAGNOSIS — Z1231 Encounter for screening mammogram for malignant neoplasm of breast: Secondary | ICD-10-CM

## 2022-03-25 ENCOUNTER — Other Ambulatory Visit: Payer: Self-pay | Admitting: Family

## 2022-03-25 DIAGNOSIS — K219 Gastro-esophageal reflux disease without esophagitis: Secondary | ICD-10-CM

## 2022-03-30 ENCOUNTER — Ambulatory Visit: Payer: Medicare Other | Admitting: Cardiology

## 2022-04-24 ENCOUNTER — Ambulatory Visit: Payer: Self-pay

## 2022-04-24 NOTE — Telephone Encounter (Signed)
Pt is calling to report a yeast infection with sx vaginal itching. Pt reports that she is not able to come in. No available appts. Please advise   Chief Complaint: Vaginal itching, burning, outside vagina. Asking for terconazole cream to be called in. Can not come in for visit. Symptoms: Above Frequency: 2 weeks ago. Tried OTC. Pertinent Negatives: Patient denies discharge Disposition: ED /[] Urgent Care (no appt availability in office) / Appointment(In office/virtual)/  [place] [facility]/ Home Care/ Refused Recommended Disposition /[] [facility]/  Follow-up with PCP Additional Notes: Please advise pt.  Answer Assessment - Initial Assessment Questions 1. SYMPTOM: "What's the main symptom you're concerned about?" (e.g., pain, itching, dryness)     Itching,burning 2. LOCATION: "Where is the   located?" (e.g., inside/outside, left/right)     Outside 3. ONSET: "When did the    start?"     2 weeks ago 4. PAIN: "Is there any pain?" If Yes, ask: "How bad is it?" (Scale: 1-10; mild, moderate, severe)   -  MILD (1-3): Doesn't interfere with normal activities.    -  MODERATE (4-7): Interferes with normal activities (e.g., work or school) or awakens from sleep.     -  SEVERE (8-10): Excruciating pain, unable to do any normal activities.     Burning 5. ITCHING: "Is there any itching?" If Yes, ask: "How bad is it?" (Scale: 1-10; mild, moderate, severe)     Moderate 6. CAUSE: "What do you think is causing the discharge?" "Have you had the same problem before? What happened then?"     Yeast  7. OTHER SYMPTOMS: "Do you have any other symptoms?" (e.g., fever, itching, vaginal bleeding, pain with urination, injury to genital area, vaginal foreign body)     No 8. PREGNANCY: "Is there any chance you are pregnant?" "When was your last menstrual period?"     No  Protocols used: Vaginal Symptoms-A-AH

## 2022-04-27 ENCOUNTER — Ambulatory Visit: Payer: Medicare Other | Admitting: Cardiology

## 2022-05-11 ENCOUNTER — Other Ambulatory Visit: Payer: Self-pay | Admitting: Family

## 2022-05-11 DIAGNOSIS — I10 Essential (primary) hypertension: Secondary | ICD-10-CM

## 2022-05-11 DIAGNOSIS — E782 Mixed hyperlipidemia: Secondary | ICD-10-CM

## 2022-05-12 NOTE — Telephone Encounter (Signed)
Requested medications are due for refill today.  yes  Requested medications are on the active medications list.  yes  Last refill. Both refilled 12/15/2021 6 month supply  Future visit scheduled.   no  Notes to clinic.  Per OV of 09/29/2021 - pt is to RTC in 1 year. Please review for refill.    Requested Prescriptions  Pending Prescriptions Disp Refills   metoprolol tartrate (LOPRESSOR) 25 MG tablet [Pharmacy Med Name: Metoprolol Tartrate 25 MG Oral Tablet] 180 tablet 3    Sig: TAKE 1 TABLET BY MOUTH TWICE  DAILY     Cardiovascular:  Beta Blockers Failed - 05/11/2022  4:15 AM      Failed - Last BP in normal range    BP Readings from Last 1 Encounters:  02/16/22 (!) 167/71         Failed - Valid encounter within last 6 months    Recent Outpatient Visits           7 months ago Annual physical exam   [facility], Amy J, NP   1 year ago Essential hypertension   [facility], Amy J, NP   1 year ago Screening for diabetes mellitus (DM)   [facility], Amy J, NP   1 year ago Essential hypertension   [facility], Amy J, NP   1 year ago Primary hypertension   [facility], Kandee Keen, MD       Future Appointments             In 2 months Shari Prows, Kathlynn Grate, MD [facility], [facility]            Passed - Last Heart Rate in normal range    Pulse Readings from Last 1 Encounters:  02/16/22 80          ezetimibe (ZETIA) 10 MG tablet [Pharmacy Med Name: Ezetimibe 10 MG Oral Tablet] 90 tablet 3    Sig: TAKE 1 TABLET BY MOUTH DAILY     Cardiovascular:  Antilipid - Sterol Transport Inhibitors Failed - 05/11/2022  4:15 AM      Failed - Lipid Panel in normal range within the last 12 months    Cholesterol, Total  Date Value Ref Range Status   02/23/2022 185 100 - 199 mg/dL Final   LDL Chol Calc (NIH)  Date Value Ref Range Status  02/23/2022 103 (H) 0 - 99 mg/dL Final   HDL  Date Value Ref Range Status  02/23/2022 62 >39 mg/dL Final   Triglycerides  Date Value Ref Range Status  02/23/2022 114 0 - 149 mg/dL Final         Passed - AST in normal range and within 360 days    AST  Date Value Ref Range Status  09/29/2021 21 0 - 40 IU/L Final         Passed - ALT in normal range and within 360 days    ALT  Date Value Ref Range Status  09/29/2021 10 0 - 32 IU/L Final         Passed - Patient is not pregnant      Passed - Valid encounter within last 12 months    Recent Outpatient Visits           7 months ago Annual physical exam  [facility], [place], NP   1 year ago Essential hypertension   [facility], [place], NP   1 year ago Screening for diabetes mellitus (DM)   [facility] [facility], [place], NP   1 year ago Essential hypertension   [facility] [facility], [place], NP   1 year ago Primary hypertension   [place] [facility], Kandee Keen, MD       Future Appointments             In 2 months Shari Prows, Kathlynn Grate, MD [facility], [facility]

## 2022-06-12 ENCOUNTER — Encounter: Payer: Self-pay | Admitting: Cardiology

## 2022-06-12 ENCOUNTER — Ambulatory Visit: Payer: Medicare Other | Attending: Cardiology | Admitting: Cardiology

## 2022-06-12 VITALS — BP 143/69 | HR 80 | Ht 60.0 in | Wt 215.4 lb

## 2022-06-12 DIAGNOSIS — I10 Essential (primary) hypertension: Secondary | ICD-10-CM | POA: Insufficient documentation

## 2022-06-12 DIAGNOSIS — G4733 Obstructive sleep apnea (adult) (pediatric): Secondary | ICD-10-CM | POA: Diagnosis present

## 2022-06-12 NOTE — Patient Instructions (Signed)
Medication Instructions:  Your physician recommends that you continue on your current medications as directed. Please refer to the Current Medication list given to you today.  *If you need a refill on your cardiac medications before your next appointment, please call your pharmacy*   Lab Work: None.  If you have labs (blood work) drawn today and your tests are completely normal, you will receive your results only by: MyChart Message (if you have MyChart) OR A paper copy in the mail If you have any lab test that is abnormal or we need to change your treatment, we will call you to review the results.   Testing/Procedures: None.   Follow-Up: At [facility], you and your health needs are our priority.  As part of our continuing mission to provide you with exceptional heart care, we have created designated Provider Care Teams.  These Care Teams include your primary Cardiologist (physician) and Advanced Practice Providers (APPs -  Physician Assistants and Nurse Practitioners) who all work together to provide you with the care you need, when you need it.  We recommend signing up for the patient portal called "MyChart".  Sign up information is provided on this After Visit Summary.  MyChart is used to connect with patients for Virtual Visits (Telemedicine).  Patients are able to view lab/test results, encounter notes, upcoming appointments, etc.  Non-urgent messages can be sent to your provider as well.   To learn more about what you can do with MyChart, go to [email or web address].    Your next appointment:   1 year(s)  Provider:   Dr. Traci Turner, MD   

## 2022-06-12 NOTE — Progress Notes (Signed)
Sleep Medicine Note    Date:  06/12/2022   ID:  [name], DOB December 01, 1931, MRN [id number]  PCP:  Rema Fendt, NP  Cardiologist: Verdis Prime, MD  Chief Complaint  Patient presents with   Sleep Apnea    History of Present Illness:  Sydney Booth is a 89 y.o. female who is being seen today for the evaluation of obstructive sleep apnea at the request of Verdis Prime, MD.  This is an 89 year old female with a history of coronary artery calcifications, hyperlipidemia, hypertension, pulmonary hypertension, mitral regurgitation/stenosis and a history of obstructive sleep apnea on CPAP in the past.   She recently got a new PAP device that she does not like compared to her prior device. She tells me that her device is not working right.  She says that it is stopping on her at night and shutting down.    She tolerates the She uses a nasal mask that she tolerates fine.  She denies any significant mouth or nasal dryness or nasal congestion.  She does not think that he snores.    Past Medical History:  Diagnosis Date   Arthritis    Blood transfusion    Cervical vertebral fracture (HCC)    fued on its on   Closed head injury    as a chlid   Coronary artery calcification seen on CT scan    Fall 12/22/2017   right eye bruised   Gastric ulcer    GERD (gastroesophageal reflux disease)    H/O hiatal hernia    Headache(784.0)    Hyperlipemia    Hypertension    Left carpal tunnel syndrome 06/08/2018   Mild pulmonary hypertension (HCC)    Mitral regurgitation    Mitral stenosis    Mitral valve prolapse    Obesity    OSA on CPAP    Osteopenia    Rotator cuff tear    Left   Seasonal allergies    Shortness of breath    With activity   Ulcer 1991   [facility]     Past Surgical History:  Procedure Laterality Date   BIospy      Biospy      Uterus wall   Carparal Tunnel     CATARACT EXTRACTION     Colonscopy     EYE SURGERY     Right 2011, Left 2008   LIPOMA EXCISION      FROM BACK    THUMB FUSION     TOTAL KNEE ARTHROPLASTY  03/24/2011   Procedure: TOTAL KNEE ARTHROPLASTY;  Surgeon: Velna Ochs, MD;  Location: [facility] OR;  Service: Orthopedics;  Laterality: Left;  with revision tibial component   TUBAL LIGATION      Current Medications: Current Meds  Medication Sig   acetaminophen (TYLENOL) 325 MG tablet Take 2 tablets (650 mg total) by mouth every 6 (six) hours as needed for moderate pain.   Ascorbic Acid (VITAMIN C PO) Take 1,000 mg by mouth in the morning and at bedtime.   aspirin 81 MG tablet Take 81 mg by mouth daily.   calcium carbonate (OS-CAL) 600 MG TABS Take 600 mg by mouth daily.   CINNAMON PO Take 500 mg by mouth 2 (two) times daily.   ezetimibe (ZETIA) 10 MG tablet TAKE 1 TABLET BY MOUTH DAILY   metoprolol tartrate (LOPRESSOR) 25 MG tablet TAKE 1 TABLET BY MOUTH TWICE  DAILY   Omega-3 Fatty Acids (FISH OIL) 1000 MG CPDR  Take 1,000 mg by mouth daily.   omeprazole (PRILOSEC) 40 MG capsule TAKE 1 CAPSULE BY MOUTH DAILY   QUERCETIN PO Take 1,500 mg by mouth daily.   spironolactone (ALDACTONE) 25 MG tablet Take 0.5 tablets (12.5 mg total) by mouth daily.   Turmeric 500 MG CAPS Take by mouth daily.   VITAMIN A PO Take 10,000 mg by mouth.   vitamin B-12 (CYANOCOBALAMIN) 1000 MCG tablet Take 1,000 mcg by mouth daily.   VITAMIN D, CHOLECALCIFEROL, PO Take 1 tablet by mouth daily.   Zinc Acetate, Oral, (ZINC ACETATE PO) Take 1 tablet by mouth.   Zinc Oxide 40 % PSTE Apply 1 application topically as needed.    Allergies:   Cymbalta [duloxetine hcl], Farxiga [dapagliflozin], Gabapentin, Statins, Sulfa antibiotics, and Zocor [simvastatin]   Social History   Socioeconomic History   Marital status: Widowed    Spouse name: Not on file   Number of children: 5   Years of education: 96   Highest education level: Not on file  Occupational History   Occupation: Retired   Tobacco Use   Smoking status: Never    Passive exposure: Never   Smokeless  tobacco: Never  Vaping Use   Vaping Use: Never used  Substance and Sexual Activity   Alcohol use: No    Alcohol/week: 0.0 standard drinks of alcohol   Drug use: No   Sexual activity: Not Currently  Other Topics Concern   Not on file  Social History Narrative   Patient lives at home with daughter   Patient is retired   Patient is widowed   Patient has a high school education   Patient has 5 children       Right Handed    Lives in a two story home   Social Determinants of Health   Financial Resource Strain: Low Risk  (09/18/2020)   Overall Financial Resource Strain (CARDIA)    Difficulty of Paying Living Expenses: Not hard at all  Food Insecurity: No Food Insecurity (09/18/2020)   Hunger Vital Sign    Worried About Running Out of Food in the Last Year: Never true    Ran Out of Food in the Last Year: Never true  Transportation Needs: No Transportation Needs (09/18/2020)   PRAPARE - Administrator, Civil Service (Medical): No    Lack of Transportation (Non-Medical): No  Physical Activity: Inactive (09/18/2020)   Exercise Vital Sign    Days of Exercise per Week: 0 days    Minutes of Exercise per Session: 0 min  Stress: No Stress Concern Present (09/18/2020)   [employer] of Occupational Health - Occupational Stress Questionnaire    Feeling of Stress : Not at all  Social Connections: Moderately Isolated (09/18/2020)   Social Connection and Isolation Panel [NHANES]    Frequency of Communication with Friends and Family: More than three times a week    Frequency of Social Gatherings with Friends and Family: More than three times a week    Attends Religious Services: More than 4 times per year    Active Member of [employer] or Organizations: No    Attends Banker Meetings: Never    Marital Status: Widowed     Family History:  The patient's family history includes ALS in her son; Autism in her grandson; Cancer in her father; Healthy in her daughter; Heart  disease in her brother; Pulmonary embolism in her brother; Sleep apnea in her brother and brother; Stroke in her mother; Tourette  syndrome in her son.   ROS:   Please see the history of present illness.    ROS All other systems reviewed and are negative.     02/01/2018    9:41 AM  PAD Screen  Previous PAD dx? No  Previous surgical procedure? No  Pain with walking? No  Feet/toe relief with dangling? No  Painful, non-healing ulcers? No  Extremities discolored? No       PHYSICAL EXAM:   VS:  BP (!) 143/69   Pulse 80   Ht 5' (1.524 m)   Wt 215 lb 6.4 oz (97.7 kg)   SpO2 94%   BMI 42.07 kg/m    GEN: Well nourished, well developed in no acute distress HEENT: Normal NECK: No JVD; No carotid bruits LYMPHATICS: No lymphadenopathy CARDIAC:RRR, no murmurs, rubs, gallops RESPIRATORY:  Clear to auscultation without rales, wheezing or rhonchi  ABDOMEN: Soft, non-tender, non-distended MUSCULOSKELETAL:  No edema; No deformity  SKIN: Warm and dry NEUROLOGIC:  Alert and oriented x 3 PSYCHIATRIC:  Normal affect   Wt Readings from Last 3 Encounters:  06/12/22 215 lb 6.4 oz (97.7 kg)  02/16/22 217 lb (98.4 kg)  02/09/22 219 lb (99.3 kg)      Studies/Labs Reviewed:   PAP compliance download  Recent Labs: 09/29/2021: ALT 10; Hemoglobin 13.1; Platelets 286; TSH 2.160 03/16/2022: BUN 14; Creatinine, Ser 0.62; Potassium 4.8; Sodium 132    Additional studies/ records that were reviewed today include:  Office notes by Dr. Katrinka Blazing    ASSESSMENT:    1. OSA (obstructive sleep apnea)   2. Essential hypertension      PLAN:  In order of problems listed above:  OSA - The patient is tolerating PAP therapy well but says that her machine does not go any higher than 4cm H2O even though it is set on auto and the device shuts down during the night and she wakes up and cannot breathe. The PAP download performed by his DME was personally reviewed and interpreted by me today and showed an AHI  of 0.7/hr on auto CPAP from 4 to 20 cm H2O with 100% compliance in using more than 4 hours nightly.  The patient has been using and benefiting from PAP use and will continue to benefit from therapy.   Hypertension -BP is adequately controlled on exam today -Prescription drug management with Lopressor 25 mg twice daily and spironolactone 12.5 mg daily with as needed refills  Followup with me in 1 year   Medication Adjustments/Labs and Tests Ordered: Current medicines are reviewed at length with the patient today.  Concerns regarding medicines are outlined above.  Medication changes, Labs and Tests ordered today are listed in the Patient Instructions below.  There are no Patient Instructions on file for this visit.   Signed, Armanda Magic, MD  06/12/2022 1:13 PM    [facility] [address] [place], [place], [place]  [address] Phone: [phone]; Fax: [phone]

## 2022-07-16 NOTE — Progress Notes (Signed)
Cardiology Office Note:    Date:  07/27/2022   ID:  Sydney Booth, DOB 1931/12/01, MRN [id number]  PCP:  Rema Fendt, NP   [facility] Providers Cardiologist:  Armanda Magic, MD {  Referring MD: Rema Fendt, NP    History of Present Illness:    Sydney Booth is a 89 y.o. female with a hx of HTN, HLD, OSA on CPAP, coronary Ca on CT, and chronic diastolic HF who was previously followed by Dr. Katrinka Blazing who now returns to clinic for follow-up.  Per review of the record, TTE 12/2020 with LVEF 60-65%, normal RV, moderate LAE, mild to moderate RAE, mild MR, severe MAC. Myoview 12/2020 with normal perfusion, normal EF.  Was last seen in clinic on 01/2022. Had stopped farxiga due to dizziness. Continued to have chronic DOE and LE edema which were unchanged. We started spiro at that time.  Today, the patient states she continues to struggle with significant neuropathy. Otherwise, no chest pain, orthopnea, or PND. Has chronic dyspnea on exertion that is unchanged and not progressing. No significant LE edema but is overall well controlled. Ambulates with a walker with no recent falls.   Blood pressure 105-125/70s at home. States is always high in the MD office.   Past Medical History:  Diagnosis Date   Arthritis    Blood transfusion    Cervical vertebral fracture (HCC)    fued on its on   Closed head injury    as a chlid   Coronary artery calcification seen on CT scan    Fall 12/22/2017   right eye bruised   Gastric ulcer    GERD (gastroesophageal reflux disease)    H/O hiatal hernia    Headache(784.0)    Hyperlipemia    Hypertension    Left carpal tunnel syndrome 06/08/2018   Mild pulmonary hypertension (HCC)    Mitral regurgitation    Mitral stenosis    Mitral valve prolapse    Obesity    OSA on CPAP    Osteopenia    Rotator cuff tear    Left   Seasonal allergies    Shortness of breath    With activity   Ulcer 1991   [place]     Past Surgical  History:  Procedure Laterality Date   BIospy      Biospy      Uterus wall   Carparal Tunnel     CATARACT EXTRACTION     Colonscopy     EYE SURGERY     Right 2011, Left 2008   LIPOMA EXCISION     FROM BACK    THUMB FUSION     TOTAL KNEE ARTHROPLASTY  03/24/2011   Procedure: TOTAL KNEE ARTHROPLASTY;  Surgeon: Velna Ochs, MD;  Location: [facility] OR;  Service: Orthopedics;  Laterality: Left;  with revision tibial component   TUBAL LIGATION      Current Medications: Current Meds  Medication Sig   acetaminophen (TYLENOL) 325 MG tablet Take 2 tablets (650 mg total) by mouth every 6 (six) hours as needed for moderate pain.   Ascorbic Acid (VITAMIN C PO) Take 1,000 mg by mouth in the morning and at bedtime.   aspirin 81 MG tablet Take 81 mg by mouth daily.   calcium carbonate (OS-CAL) 600 MG TABS Take 600 mg by mouth daily.   CINNAMON PO Take 500 mg by mouth 2 (two) times daily.   ezetimibe (ZETIA) 10 MG tablet TAKE 1 TABLET BY MOUTH  DAILY   metoprolol tartrate (LOPRESSOR) 25 MG tablet TAKE 1 TABLET BY MOUTH TWICE  DAILY   Omega-3 Fatty Acids (FISH OIL) 1000 MG CPDR Take 1,000 mg by mouth daily.   omeprazole (PRILOSEC) 40 MG capsule TAKE 1 CAPSULE BY MOUTH DAILY   QUERCETIN PO Take 1,500 mg by mouth daily.   spironolactone (ALDACTONE) 25 MG tablet Take 0.5 tablets (12.5 mg total) by mouth daily.   Turmeric 500 MG CAPS Take by mouth daily.   VITAMIN A PO Take 10,000 mg by mouth.   vitamin B-12 (CYANOCOBALAMIN) 1000 MCG tablet Take 1,000 mcg by mouth daily.   VITAMIN D, CHOLECALCIFEROL, PO Take 1 tablet by mouth daily.   Zinc Acetate, Oral, (ZINC ACETATE PO) Take 1 tablet by mouth.   Zinc Oxide 40 % PSTE Apply 1 application topically as needed.     Allergies:   Cymbalta [duloxetine hcl], Farxiga [dapagliflozin], Gabapentin, Statins, Sulfa antibiotics, and Zocor [simvastatin]   Social History   Socioeconomic History   Marital status: Widowed    Spouse name: Not on file   Number of  children: 5   Years of education: 86   Highest education level: Not on file  Occupational History   Occupation: Retired   Tobacco Use   Smoking status: Never    Passive exposure: Never   Smokeless tobacco: Never  Vaping Use   Vaping status: Never Used  Substance and Sexual Activity   Alcohol use: No    Alcohol/week: 0.0 standard drinks of alcohol   Drug use: No   Sexual activity: Not Currently  Other Topics Concern   Not on file  Social History Narrative   Patient lives at home with daughter   Patient is retired   Patient is widowed   Patient has a high school education   Patient has 5 children       Right Handed    Lives in a two story home   Social Determinants of Health   Financial Resource Strain: Low Risk  (09/18/2020)   Overall Financial Resource Strain (CARDIA)    Difficulty of Paying Living Expenses: Not hard at all  Food Insecurity: No Food Insecurity (09/18/2020)   Hunger Vital Sign    Worried About Running Out of Food in the Last Year: Never true    Ran Out of Food in the Last Year: Never true  Transportation Needs: No Transportation Needs (09/18/2020)   PRAPARE - Administrator, Civil Service (Medical): No    Lack of Transportation (Non-Medical): No  Physical Activity: Inactive (09/18/2020)   Exercise Vital Sign    Days of Exercise per Week: 0 days    Minutes of Exercise per Session: 0 min  Stress: No Stress Concern Present (09/18/2020)   [employer] of Occupational Health - Occupational Stress Questionnaire    Feeling of Stress : Not at all  Social Connections: Moderately Isolated (09/18/2020)   Social Connection and Isolation Panel [NHANES]    Frequency of Communication with Friends and Family: More than three times a week    Frequency of Social Gatherings with Friends and Family: More than three times a week    Attends Religious Services: More than 4 times per year    Active Member of [employer] or Organizations: No    Attends Tax inspector Meetings: Never    Marital Status: Widowed     Family History: The patient's family history includes ALS in her son; Autism in her grandson; Cancer in her  father; Healthy in her daughter; Heart disease in her brother; Pulmonary embolism in her brother; Sleep apnea in her brother and brother; Stroke in her mother; Tourette syndrome in her son. There is no history of Anesthesia problems or Breast cancer.  ROS:   Please see the history of present illness.     All other systems reviewed and are negative.  EKGs/Labs/Other Studies Reviewed:    The following studies were reviewed today: TTE January 26, 2021: IMPRESSIONS     1. Left ventricular ejection fraction, by estimation, is 60 to 65%. The  left ventricle has normal function. The left ventricle has no regional  wall motion abnormalities. There is mild concentric left ventricular  hypertrophy. Left ventricular diastolic  function could not be evaluated.   2. Right ventricular systolic function is normal. The right ventricular  size is moderately enlarged. There is moderately elevated pulmonary artery  systolic pressure.   3. Left atrial size was moderately dilated.   4. Right atrial size was mild to moderately dilated.   5. The mitral valve is abnormal. Mild mitral valve regurgitation. The  mean mitral valve gradient is 3.0 mmHg with average heart rate of 86 bpm.  Severe mitral annular calcification.   6. The aortic valve is grossly normal. There is mild calcification of the  aortic valve. Aortic valve regurgitation is not visualized. Aortic valve  sclerosis/calcification is present, without any evidence of aortic  stenosis.   7. The inferior vena cava is normal in size with <50% respiratory  variability, suggesting right atrial pressure of 8 mmHg.   Comparison(s): No significant change from prior study.   Myoview 2022:   Findings are consistent with no prior ischemia and no prior myocardial infarction. The study is low  risk.   No ST deviation was noted.   Left ventricular function is normal. Nuclear stress EF: 63 %. The left ventricular ejection fraction is normal (55-65%). End diastolic cavity size is normal. End systolic cavity size is normal.   Prior study not available for comparison.   Findings: Negative for stress induced arrhythmias. Baseline LBBB (pharmacologic test).  There is slight decrease in apical counts in the rest that improves with stress.  No evidence of ischemia or infarction.   Conclusions: Stress test is negative. Low risk study.  EKG:  ECG: NSR, LBBB, inferolateral t-wave inversions   Recent Labs: 09/29/2021: ALT 10; Hemoglobin 13.1; Platelets 286; TSH 2.160 03/16/2022: BUN 14; Creatinine, Ser 0.62; Potassium 4.8; Sodium 132  Recent Lipid Panel    Component Value Date/Time   CHOL 185 02/23/2022 0742   TRIG 114 02/23/2022 0742   HDL 62 02/23/2022 0742   CHOLHDL 3.0 02/23/2022 0742   LDLCALC 103 (H) 02/23/2022 0742     Risk Assessment/Calculations:            Physical Exam:    VS:  BP 117/72   Pulse 69   Ht 5\' 4"  (1.626 m)   Wt 216 lb 6.4 oz (98.2 kg)   SpO2 98%   BMI 37.14 kg/m     Wt Readings from Last 3 Encounters:  07/27/22 216 lb 6.4 oz (98.2 kg)  06/12/22 215 lb 6.4 oz (97.7 kg)  02/16/22 217 lb (98.4 kg)     GEN:  Well nourished, well developed in no acute distress HEENT: Normal NECK: No JVD; No carotid bruits CARDIAC: RRR, 2/6 systolic murmur RESPIRATORY:  Clear to auscultation without rales, wheezing or rhonchi  ABDOMEN: Soft, non-tender, non-distended MUSCULOSKELETAL:  Trace pedal edema, warm SKIN:  Warm and dry NEUROLOGIC:  Alert and oriented x 3 PSYCHIATRIC:  Normal affect   ASSESSMENT:    1. Chronic diastolic heart failure (HCC)   2. Mild pulmonary hypertension (HCC)   3. Coronary artery calcification seen on CT scan   4. Essential hypertension   5. Hyperlipidemia, unspecified hyperlipidemia type   6. OSA (obstructive sleep apnea)    7. DOE (dyspnea on exertion)   8. Pulmonary hypertension, unspecified (HCC)   9. LBBB (left bundle branch block)     PLAN:    In order of problems listed above:  #Chronic Diastolic HF: TTE 40/9811 with LVEF 60-65%. Currently euvolemic with NYHA class II symptoms. -Continue spironolactone 12.5mg  daily -Did not tolerate SGLT2i due to dizziness and constipation  #CAD with Ca on CT chest: Has chronic dyspnea on exertion that is unchanged. Otherwise, no significant chest pain. SPECT in 12/2020 with no significant ischemia or infarction. -Continue ASA 81mg  daily -Continue zetia 10mg  daily  #Pulmonary HTN: Likely due to OSA and HFpEF. Currently euvolemic as above -Continue CPAP -Continue BP control -Currently euvolemic -Continue serial monitoring  #HTN: Controlled at home.  -Continue metop 25mg  BID -Continue spiro 12.5mg  daily  #HLD: Statin intolerant now on zetia 10mg  daily. -Continue zetia 10mg  daily -LDL 100; will continue current therapies  #LBBB: #Abnormal ECG: -Has chronic dyspnea on exertion that is unchanged but no new ischemic symptoms -Myoview 2022 with no ischemia or infarct -Will check TTE for monitoring        Medication Adjustments/Labs and Tests Ordered: Current medicines are reviewed at length with the patient today.  Concerns regarding medicines are outlined above.  Orders Placed This Encounter  Procedures   EKG 12-Lead   ECHOCARDIOGRAM COMPLETE   No orders of the defined types were placed in this encounter.   Patient Instructions  Medication Instructions:  Your physician recommends that you continue on your current medications as directed. Please refer to the Current Medication list given to you today.  *If you need a refill on your cardiac medications before your next appointment, please call your pharmacy*    Testing/Procedures: ECHO Your physician has requested that you have an echocardiogram. Echocardiography is a painless test that  uses sound waves to create images of your heart. It provides your doctor with information about the size and shape of your heart and how well your heart's chambers and valves are working. This procedure takes approximately one hour. There are no restrictions for this procedure. Please do NOT wear cologne, perfume, aftershave, or lotions (deodorant is allowed). Please arrive 15 minutes prior to your appointment time.    Follow-Up: At [facility], you and your health needs are our priority.  As part of our continuing mission to provide you with exceptional heart care, we have created designated Provider Care Teams.  These Care Teams include your primary Cardiologist (physician) and Advanced Practice Providers (APPs -  Physician Assistants and Nurse Practitioners) who all work together to provide you with the care you need, when you need it.  We recommend signing up for the patient portal called "MyChart".  Sign up information is provided on this After Visit Summary.  MyChart is used to connect with patients for Virtual Visits (Telemedicine).  Patients are able to view lab/test results, encounter notes, upcoming appointments, etc.  Non-urgent messages can be sent to your provider as well.   To learn more about what you can do with MyChart, go to [email or web address].    Your next appointment:   6  month(s)  Provider:   Dr Mayford Knife    Signed, Meriam Sprague, MD  07/27/2022 8:55 AM    [facility]

## 2022-07-27 ENCOUNTER — Encounter: Payer: Self-pay | Admitting: Cardiology

## 2022-07-27 ENCOUNTER — Ambulatory Visit: Payer: Medicare Other | Attending: Cardiology | Admitting: Cardiology

## 2022-07-27 VITALS — BP 117/72 | HR 69 | Ht 64.0 in | Wt 216.4 lb

## 2022-07-27 DIAGNOSIS — I447 Left bundle-branch block, unspecified: Secondary | ICD-10-CM | POA: Diagnosis present

## 2022-07-27 DIAGNOSIS — I272 Pulmonary hypertension, unspecified: Secondary | ICD-10-CM | POA: Insufficient documentation

## 2022-07-27 DIAGNOSIS — I10 Essential (primary) hypertension: Secondary | ICD-10-CM | POA: Insufficient documentation

## 2022-07-27 DIAGNOSIS — E785 Hyperlipidemia, unspecified: Secondary | ICD-10-CM | POA: Insufficient documentation

## 2022-07-27 DIAGNOSIS — G4733 Obstructive sleep apnea (adult) (pediatric): Secondary | ICD-10-CM | POA: Insufficient documentation

## 2022-07-27 DIAGNOSIS — R0609 Other forms of dyspnea: Secondary | ICD-10-CM | POA: Diagnosis present

## 2022-07-27 DIAGNOSIS — I5032 Chronic diastolic (congestive) heart failure: Secondary | ICD-10-CM | POA: Insufficient documentation

## 2022-07-27 DIAGNOSIS — I2510 Atherosclerotic heart disease of native coronary artery without angina pectoris: Secondary | ICD-10-CM | POA: Insufficient documentation

## 2022-07-27 NOTE — Patient Instructions (Signed)
Medication Instructions:  Your physician recommends that you continue on your current medications as directed. Please refer to the Current Medication list given to you today.  *If you need a refill on your cardiac medications before your next appointment, please call your pharmacy*    Testing/Procedures: ECHO Your physician has requested that you have an echocardiogram. Echocardiography is a painless test that uses sound waves to create images of your heart. It provides your doctor with information about the size and shape of your heart and how well your heart's chambers and valves are working. This procedure takes approximately one hour. There are no restrictions for this procedure. Please do NOT wear cologne, perfume, aftershave, or lotions (deodorant is allowed). Please arrive 15 minutes prior to your appointment time.    Follow-Up: At [facility], you and your health needs are our priority.  As part of our continuing mission to provide you with exceptional heart care, we have created designated Provider Care Teams.  These Care Teams include your primary Cardiologist (physician) and Advanced Practice Providers (APPs -  Physician Assistants and Nurse Practitioners) who all work together to provide you with the care you need, when you need it.  We recommend signing up for the patient portal called "MyChart".  Sign up information is provided on this After Visit Summary.  MyChart is used to connect with patients for Virtual Visits (Telemedicine).  Patients are able to view lab/test results, encounter notes, upcoming appointments, etc.  Non-urgent messages can be sent to your provider as well.   To learn more about what you can do with MyChart, go to [email or web address].    Your next appointment:   6 month(s)  Provider:   Dr Mayford Knife

## 2022-08-14 ENCOUNTER — Ambulatory Visit (HOSPITAL_COMMUNITY): Payer: Medicare Other

## 2022-08-24 ENCOUNTER — Telehealth: Payer: Self-pay

## 2022-08-24 ENCOUNTER — Encounter: Payer: Self-pay | Admitting: Cardiology

## 2022-08-24 ENCOUNTER — Ambulatory Visit (HOSPITAL_COMMUNITY): Payer: Medicare Other | Attending: Cardiology

## 2022-08-24 DIAGNOSIS — I447 Left bundle-branch block, unspecified: Secondary | ICD-10-CM | POA: Insufficient documentation

## 2022-08-24 DIAGNOSIS — I059 Rheumatic mitral valve disease, unspecified: Secondary | ICD-10-CM

## 2022-08-24 DIAGNOSIS — I341 Nonrheumatic mitral (valve) prolapse: Secondary | ICD-10-CM

## 2022-08-24 LAB — ECHOCARDIOGRAM COMPLETE
Area-P 1/2: 5.27 cm2
MV M vel: 5.06 m/s
MV Peak grad: 102.4 mmHg
MV VTI: 1.19 cm2
Radius: 0.7 cm
S' Lateral: 2.7 cm

## 2022-08-24 NOTE — Telephone Encounter (Signed)
-----   Message from Armanda Magic sent at 08/24/2022  1:25 PM EDT ----- Echo showed normal heart function EF 60 to 65% with moderate thickening of the heart muscle and increased stiffness of the heart muscle called great to diastolic dysfunction.  The left atrium is mildly enlarged due to hypertension.  There is mild leakiness of the mitral valve and mild thickening of the mitral valve with mild mitral valve stenosis.  Please repeat echo in 1 year for mitral stenosis and MR

## 2022-08-24 NOTE — Telephone Encounter (Signed)
Reviewed with patient that echo showed normal heart function EF 60 to 65% with moderate thickening of the heart muscle and increased stiffness of the heart muscle called diastolic dysfunction.  The left atrium is mildly enlarged due to hypertension,  mild leakiness of the mitral valve and mild thickening of the mitral valve with mild mitral valve stenosis also present.  Patient verbalizes understanding to repeat echo in 1 year for mitral stenosis and MR, order placed.

## 2022-08-31 ENCOUNTER — Ambulatory Visit (HOSPITAL_COMMUNITY): Payer: Medicare Other

## 2023-01-19 ENCOUNTER — Encounter: Payer: Self-pay | Admitting: Internal Medicine

## 2023-01-20 ENCOUNTER — Other Ambulatory Visit: Payer: Self-pay | Admitting: Internal Medicine

## 2023-01-20 DIAGNOSIS — E2839 Other primary ovarian failure: Secondary | ICD-10-CM

## 2023-04-06 ENCOUNTER — Other Ambulatory Visit: Payer: Self-pay | Admitting: Internal Medicine

## 2023-04-06 DIAGNOSIS — Z1231 Encounter for screening mammogram for malignant neoplasm of breast: Secondary | ICD-10-CM

## 2023-04-18 ENCOUNTER — Encounter (HOSPITAL_COMMUNITY): Payer: Self-pay

## 2023-04-18 ENCOUNTER — Emergency Department (HOSPITAL_COMMUNITY)
Admission: EM | Admit: 2023-04-18 | Discharge: 2023-04-18 | Disposition: A | Discharge status: Home / Self Care | Attending: Emergency Medicine | Admitting: Emergency Medicine

## 2023-04-18 ENCOUNTER — Other Ambulatory Visit: Payer: Self-pay

## 2023-04-18 DIAGNOSIS — R040 Epistaxis: Secondary | ICD-10-CM | POA: Diagnosis present

## 2023-04-18 DIAGNOSIS — Z7982 Long term (current) use of aspirin: Secondary | ICD-10-CM | POA: Diagnosis not present

## 2023-04-18 DIAGNOSIS — Z79899 Other long term (current) drug therapy: Secondary | ICD-10-CM | POA: Insufficient documentation

## 2023-04-18 DIAGNOSIS — I10 Essential (primary) hypertension: Secondary | ICD-10-CM | POA: Diagnosis not present

## 2023-04-18 MED ORDER — OXYMETAZOLINE HCL 0.05 % NA SOLN
1.0000 | Freq: Once | NASAL | Status: AC
  Administered 2023-04-18: 1 via NASAL
  Filled 2023-04-18: qty 30

## 2023-04-18 MED ORDER — STERILE WATER FOR INJECTION IJ SOLN
INTRAMUSCULAR | Status: AC
  Filled 2023-04-18: qty 10

## 2023-04-18 MED ORDER — LIDOCAINE HCL 4 % EX SOLN
Freq: Once | CUTANEOUS | Status: DC
  Filled 2023-04-18: qty 50

## 2023-04-18 NOTE — ED Triage Notes (Signed)
 Pt from home with PTAR c.o nosebleed since around 6am. Pt was blowing her nose when she suddenly felt a popping sensation, when PTAR arrived pt was bleeding from both nostrils and the FD had an ice pack over her nose. Pt does not take any blood thinners.  Pt given 2 sprays of Afrin in each nostril by PTAR.  Pt a.o, denies any headache or dizziness

## 2023-04-18 NOTE — Discharge Instructions (Signed)
 Please call ENT office tomorrow for outpatient follow-up. You may use your CPAP tonight Make sure that you do not dislodge the packing. Return if you are having any new or worsening bleeding or other problems. Packing will be removed in ENT office

## 2023-04-18 NOTE — ED Provider Notes (Signed)
 [place] EMERGENCY DEPARTMENT AT [facility] Provider Note   CSN: [id number] Arrival date & time: 04/18/23  0736     History  Chief Complaint  Patient presents with   Epistaxis    Sydney Booth is a 89 y.o. female.  HPI 89 year old female history of hypertension hypercholesterolemia, not on blood thinners had a nosebleed that began this a.m. around 6.  She states that she had some runny nose which she attributes to allergies this spring.  She has had some each morning.  She blew her nose really hard and felt a popping sensation and began having bleeding out of the right nares.  She does not have any known bleeding diathesis.  She has not had a nosebleed since she was a child.  She is not having any pain and no recent injury.  She denies any lightheadedness or nausea.  She is holding pressure over the mid nose on my initial evaluation.     Home Medications Prior to Admission medications   Medication Sig Start Date End Date Taking? Authorizing Provider  acetaminophen (TYLENOL) 325 MG tablet Take 2 tablets (650 mg total) by mouth every 6 (six) hours as needed for moderate pain. 08/09/20   Adolph Hoop, PA-C  Ascorbic Acid (VITAMIN C PO) Take 1,000 mg by mouth in the morning and at bedtime.    [provider]  aspirin 81 MG tablet Take 81 mg by mouth daily.    [provider]  calcium carbonate (OS-CAL) 600 MG TABS Take 600 mg by mouth daily.    [provider]  CINNAMON PO Take 500 mg by mouth 2 (two) times daily.    [provider]  ezetimibe (ZETIA) 10 MG tablet TAKE 1 TABLET BY MOUTH DAILY 05/12/22   Senaida Dama, NP  metoprolol tartrate (LOPRESSOR) 25 MG tablet TAKE 1 TABLET BY MOUTH TWICE  DAILY 05/12/22   Senaida Dama, NP  Omega-3 Fatty Acids (FISH OIL) 1000 MG CPDR Take 1,000 mg by mouth daily.    [provider]  omeprazole (PRILOSEC) 40 MG capsule TAKE 1 CAPSULE BY MOUTH DAILY 03/25/22   Senaida Dama, NP  QUERCETIN PO  Take 1,500 mg by mouth daily.    [provider]  spironolactone (ALDACTONE) 25 MG tablet Take 0.5 tablets (12.5 mg total) by mouth daily. 02/02/22   Sonny Dust, MD  Turmeric 500 MG CAPS Take by mouth daily.    [provider]  VITAMIN A PO Take 10,000 mg by mouth.    [provider]  vitamin B-12 (CYANOCOBALAMIN) 1000 MCG tablet Take 1,000 mcg by mouth daily.    [provider]  VITAMIN D, CHOLECALCIFEROL, PO Take 1 tablet by mouth daily.    [provider]  Zinc Acetate, Oral, (ZINC ACETATE PO) Take 1 tablet by mouth.    [provider]  Zinc Oxide 40 % PSTE Apply 1 application topically as needed. 09/22/19   Alfrieda Antes, PA-C      Allergies    Cymbalta [duloxetine hcl], Farxiga [dapagliflozin], Gabapentin, Statins, Sulfa antibiotics, and Zocor [simvastatin]    Review of Systems   Review of Systems  Physical Exam Updated Vital Signs BP (!) 185/87   Temp (!) 97.4 F (36.3 C) (Oral)   Resp 14   Ht 1.524 m (5')   Wt 90.7 kg   BMI 39.06 kg/m  Physical Exam Vitals reviewed.  HENT:     Head: Normocephalic and atraumatic.  Right Ear: External ear normal.     Left Ear: External ear normal.     Nose:     Comments: Blood from right nares Cardiovascular:     Rate and Rhythm: Normal rate.     Pulses: Normal pulses.  Pulmonary:     Effort: Pulmonary effort is normal.  Abdominal:     Palpations: Abdomen is soft.  Musculoskeletal:     Cervical back: Normal range of motion.  Neurological:     Mental Status: She is alert.     ED Results / Procedures / Treatments   Labs (all labs ordered are listed, but only abnormal results are displayed) Labs Reviewed - No data to display  EKG None  Radiology No results found.  Procedures Epistaxis Management  Date/Time: 04/18/2023 9:05 AM  Performed by: Auston Blush, MD Authorized by: Auston Blush, MD   Consent:    Consent obtained:  Verbal   Consent given by:   Patient   Risks discussed:  Bleeding   Alternatives discussed:  No treatment Universal protocol:    Patient identity confirmed:  Verbally with patient Anesthesia:    Anesthesia method:  Topical application   Topical anesthetic:  Lidocaine gel Procedure details:    Treatment site:  R anterior   Treatment method:  Anterior pack (rhino rocket 4.5 inserted with 4 cc air)   Treatment complexity:  Extensive   Treatment episode: initial   Post-procedure details:    Assessment:  Bleeding stopped   Procedure completion:  Tolerated Comments:     Afrin given prior to lidocaine-one spray right nares     Medications Ordered in ED Medications  lidocaine (XYLOCAINE) 4 % external solution (has no administration in time range)  sterile water (preservative free) injection (has no administration in time range)  oxymetazoline (AFRIN) 0.05 % nasal spray 1 spray (1 spray Each Nare Given 04/18/23 0805)    ED Course/ Medical Decision Making/ A&P                                 Medical Decision Making  89 year old female not on blood thinners presents with atraumatic nosebleed to her right nares.  Bleeding appears to be anterior.  Patient had extended pressure placed on bilateral nares.  Nares were cleared of clot with blowing.  Patient had Afrin placed for bleeding control.  Lidocaine was used for topical anesthesia.  Rhino Rocket was placed for ongoing bleeding control. Patient is hypertensive but is not taking her medications this morning. She will take her home medications. Plan referral to ENT for outpatient follow-up        Final Clinical Impression(s) / ED Diagnoses Final diagnoses:  Epistaxis    Rx / DC Orders ED Discharge Orders     None         Auston Blush, MD 04/18/23 [phone]

## 2023-04-19 ENCOUNTER — Observation Stay (HOSPITAL_COMMUNITY)

## 2023-04-19 ENCOUNTER — Observation Stay (HOSPITAL_COMMUNITY)
Admission: EM | Admit: 2023-04-19 | Discharge: 2023-04-20 | Disposition: A | Discharge status: Home / Self Care | Attending: Emergency Medicine | Admitting: Emergency Medicine

## 2023-04-19 ENCOUNTER — Encounter (HOSPITAL_COMMUNITY): Payer: Self-pay

## 2023-04-19 ENCOUNTER — Emergency Department (HOSPITAL_COMMUNITY)

## 2023-04-19 DIAGNOSIS — R569 Unspecified convulsions: Secondary | ICD-10-CM | POA: Diagnosis not present

## 2023-04-19 DIAGNOSIS — Z7982 Long term (current) use of aspirin: Secondary | ICD-10-CM | POA: Diagnosis not present

## 2023-04-19 DIAGNOSIS — R0789 Other chest pain: Secondary | ICD-10-CM | POA: Insufficient documentation

## 2023-04-19 DIAGNOSIS — E871 Hypo-osmolality and hyponatremia: Secondary | ICD-10-CM | POA: Insufficient documentation

## 2023-04-19 DIAGNOSIS — K219 Gastro-esophageal reflux disease without esophagitis: Secondary | ICD-10-CM | POA: Insufficient documentation

## 2023-04-19 DIAGNOSIS — R55 Syncope and collapse: Secondary | ICD-10-CM

## 2023-04-19 DIAGNOSIS — Z79899 Other long term (current) drug therapy: Secondary | ICD-10-CM | POA: Insufficient documentation

## 2023-04-19 DIAGNOSIS — I10 Essential (primary) hypertension: Secondary | ICD-10-CM | POA: Diagnosis not present

## 2023-04-19 DIAGNOSIS — I214 Non-ST elevation (NSTEMI) myocardial infarction: Secondary | ICD-10-CM | POA: Insufficient documentation

## 2023-04-19 DIAGNOSIS — Z96652 Presence of left artificial knee joint: Secondary | ICD-10-CM | POA: Diagnosis not present

## 2023-04-19 DIAGNOSIS — E785 Hyperlipidemia, unspecified: Secondary | ICD-10-CM | POA: Insufficient documentation

## 2023-04-19 DIAGNOSIS — R040 Epistaxis: Secondary | ICD-10-CM | POA: Diagnosis not present

## 2023-04-19 LAB — ECHOCARDIOGRAM COMPLETE
AR max vel: 2.31 cm2
AV Area VTI: 2.23 cm2
AV Area mean vel: 2.27 cm2
AV Mean grad: 13 mmHg
AV Peak grad: 20.4 mmHg
Ao pk vel: 2.26 m/s
Area-P 1/2: 3.39 cm2
Height: 60 in
MV VTI: 2.53 cm2
S' Lateral: 2.6 cm
Weight: 3199.32 [oz_av]

## 2023-04-19 LAB — CBC
HCT: 38.1 % (ref 36.0–46.0)
Hemoglobin: 12.8 g/dL (ref 12.0–15.0)
MCH: 29.4 pg (ref 26.0–34.0)
MCHC: 33.6 g/dL (ref 30.0–36.0)
MCV: 87.4 fL (ref 80.0–100.0)
Platelets: 310 10*3/uL (ref 150–400)
RBC: 4.36 MIL/uL (ref 3.87–5.11)
RDW: 14.2 % (ref 11.5–15.5)
WBC: 4.9 10*3/uL (ref 4.0–10.5)
nRBC: 0 % (ref 0.0–0.2)

## 2023-04-19 LAB — COMPREHENSIVE METABOLIC PANEL WITH GFR
ALT: 12 U/L (ref 0–44)
AST: 22 U/L (ref 15–41)
Albumin: 3.4 g/dL — ABNORMAL LOW (ref 3.5–5.0)
Alkaline Phosphatase: 93 U/L (ref 38–126)
Anion gap: 11 (ref 5–15)
BUN: 16 mg/dL (ref 8–23)
CO2: 23 mmol/L (ref 22–32)
Calcium: 9.5 mg/dL (ref 8.9–10.3)
Chloride: 99 mmol/L (ref 98–111)
Creatinine, Ser: 0.85 mg/dL (ref 0.44–1.00)
GFR, Estimated: >60 mL/min (ref >60)
Glucose, Bld: 101 mg/dL — ABNORMAL HIGH (ref 70–99)
Potassium: 4.3 mmol/L (ref 3.5–5.1)
Sodium: 133 mmol/L — ABNORMAL LOW (ref 135–145)
Total Bilirubin: 0.8 mg/dL (ref 0.0–1.2)
Total Protein: 6.8 g/dL (ref 6.5–8.1)

## 2023-04-19 LAB — CBC WITH DIFFERENTIAL/PLATELET
Abs Immature Granulocytes: 0.01 10*3/uL (ref 0.00–0.07)
Basophils Absolute: 0 10*3/uL (ref 0.0–0.1)
Basophils Relative: 0 %
Eosinophils Absolute: 0.1 10*3/uL (ref 0.0–0.5)
Eosinophils Relative: 1 %
HCT: 37.8 % (ref 36.0–46.0)
Hemoglobin: 12.7 g/dL (ref 12.0–15.0)
Immature Granulocytes: 0 %
Lymphocytes Relative: 21 %
Lymphs Abs: 1 10*3/uL (ref 0.7–4.0)
MCH: 29.3 pg (ref 26.0–34.0)
MCHC: 33.6 g/dL (ref 30.0–36.0)
MCV: 87.3 fL (ref 80.0–100.0)
Monocytes Absolute: 0.4 10*3/uL (ref 0.1–1.0)
Monocytes Relative: 8 %
Neutro Abs: 3.5 10*3/uL (ref 1.7–7.7)
Neutrophils Relative %: 70 %
Platelets: 333 10*3/uL (ref 150–400)
RBC: 4.33 MIL/uL (ref 3.87–5.11)
RDW: 14.2 % (ref 11.5–15.5)
WBC: 4.9 10*3/uL (ref 4.0–10.5)
nRBC: 0 % (ref 0.0–0.2)

## 2023-04-19 LAB — GLUCOSE, CAPILLARY: Glucose-Capillary: 119 mg/dL — ABNORMAL HIGH (ref 70–99)

## 2023-04-19 LAB — TROPONIN I (HIGH SENSITIVITY)
Troponin I (High Sensitivity): 21 ng/L — ABNORMAL HIGH (ref <18)
Troponin I (High Sensitivity): 23 ng/L — ABNORMAL HIGH (ref <18)
Troponin I (High Sensitivity): 24 ng/L — ABNORMAL HIGH (ref <18)

## 2023-04-19 LAB — CBG MONITORING, ED: Glucose-Capillary: 88 mg/dL (ref 70–99)

## 2023-04-19 MED ORDER — METOPROLOL TARTRATE 25 MG PO TABS: 25.0000 mg | ORAL_TABLET | Freq: Two times a day (BID) | ORAL | Status: DC

## 2023-04-19 MED ORDER — SPIRONOLACTONE 12.5 MG HALF TABLET: 12.5000 mg | ORAL_TABLET | Freq: Every day | ORAL | Status: DC

## 2023-04-19 MED ORDER — PANTOPRAZOLE SODIUM 40 MG PO TBEC
40.0000 mg | DELAYED_RELEASE_TABLET | Freq: Every day | ORAL | Status: DC
  Administered 2023-04-19 – 2023-04-20 (×2): 40 mg via ORAL
  Filled 2023-04-19 (×2): qty 1

## 2023-04-19 MED ORDER — ENOXAPARIN SODIUM 40 MG/0.4ML IJ SOSY
40.0000 mg | PREFILLED_SYRINGE | INTRAMUSCULAR | Status: DC
  Administered 2023-04-19 – 2023-04-20 (×2): 40 mg via SUBCUTANEOUS
  Filled 2023-04-19 (×2): qty 0.4

## 2023-04-19 MED ORDER — GADOBUTROL 1 MMOL/ML IV SOLN
9.0000 mL | Freq: Once | INTRAVENOUS | Status: AC | PRN
  Administered 2023-04-19: 9 mL via INTRAVENOUS

## 2023-04-19 MED ORDER — LORAZEPAM 2 MG/ML IJ SOLN
0.5000 mg | Freq: Once | INTRAMUSCULAR | Status: AC
  Administered 2023-04-19: 0.5 mg via INTRAVENOUS
  Filled 2023-04-19: qty 1

## 2023-04-19 MED ORDER — ACETAMINOPHEN 650 MG RE SUPP: 650.0000 mg | Freq: Four times a day (QID) | RECTAL | Status: DC | PRN

## 2023-04-19 MED ORDER — EZETIMIBE 10 MG PO TABS
10.0000 mg | ORAL_TABLET | Freq: Every day | ORAL | Status: DC
  Administered 2023-04-19 – 2023-04-20 (×2): 10 mg via ORAL
  Filled 2023-04-19 (×2): qty 1

## 2023-04-19 MED ORDER — ASPIRIN 81 MG PO TBEC
81.0000 mg | DELAYED_RELEASE_TABLET | Freq: Every day | ORAL | Status: DC
  Administered 2023-04-20: 81 mg via ORAL
  Filled 2023-04-19: qty 1

## 2023-04-19 MED ORDER — ACETAMINOPHEN 325 MG PO TABS
650.0000 mg | ORAL_TABLET | Freq: Four times a day (QID) | ORAL | Status: DC | PRN
Start: 2023-04-19 — End: 2023-04-20

## 2023-04-19 MED ORDER — ASPIRIN 81 MG PO CHEW
324.0000 mg | CHEWABLE_TABLET | Freq: Once | ORAL | Status: AC
  Administered 2023-04-19: 324 mg via ORAL
  Filled 2023-04-19: qty 4

## 2023-04-19 MED ORDER — SODIUM CHLORIDE 0.9% FLUSH
3.0000 mL | Freq: Two times a day (BID) | INTRAVENOUS | Status: DC
  Administered 2023-04-19 – 2023-04-20 (×3): 3 mL via INTRAVENOUS

## 2023-04-19 MED ORDER — LACTATED RINGERS IV BOLUS
500.0000 mL | Freq: Once | INTRAVENOUS | Status: AC
  Administered 2023-04-19: 500 mL via INTRAVENOUS

## 2023-04-19 MED ORDER — METOPROLOL TARTRATE 25 MG PO TABS
25.0000 mg | ORAL_TABLET | Freq: Two times a day (BID) | ORAL | Status: DC
  Administered 2023-04-19 – 2023-04-20 (×2): 25 mg via ORAL
  Filled 2023-04-19 (×2): qty 1

## 2023-04-19 NOTE — Procedures (Signed)
 Patient Name: Sydney Booth  MRN: [id number]  Epilepsy Attending: Arleene Lack  Referring Physician/Provider: Mandy Second, MD  Date: 04/19/2023 Duration: 24 mins  Patient history: 89yo F with LOC. EEG to evaluate for seizure  Level of alertness: Awake  AEDs during EEG study: None  Technical aspects: This EEG study was done with scalp electrodes positioned according to the 10-20 International system of electrode placement. Electrical activity was reviewed with band pass filter of 1-70Hz , sensitivity of 7 uV/mm, display speed of 51mm/sec with a 60Hz  notched filter applied as appropriate. EEG data were recorded continuously and digitally stored.  Video monitoring was available and reviewed as appropriate.  Description: The posterior dominant rhythm consists of 8 Hz activity of moderate voltage (25-35 uV) seen predominantly in posterior head regions, symmetric and reactive to eye opening and eye closing. Hyperventilation and photic stimulation were not performed.     IMPRESSION: This study is within normal limits. No seizures or epileptiform discharges were seen throughout the recording.  A normal interictal EEG does not exclude the diagnosis of epilepsy.  Fitz Matsuo O Dayshia Ballinas

## 2023-04-19 NOTE — Progress Notes (Signed)
 EEG complete - results pending

## 2023-04-19 NOTE — ED Provider Notes (Signed)
 [place] EMERGENCY DEPARTMENT AT [facility] Provider Note   CSN: [id number] Arrival date & time: 04/19/23  4098     History {Add pertinent medical, surgical, social history, OB history to HPI:1} Chief Complaint  Patient presents with   Loss of Consciousness    Sydney Booth is a 89 y.o. female.  HPI 89 year old female history of hypertension, hyperlipidemia, nosebleed yesterday seen by me with nasal packing, presents today after episode of unresponsiveness.  Patient states she went down to have her coffee and was sitting at the table this morning.  EMS reports that daughter was there and patient became unresponsive.  Daughter had to hold her up in the chair.  EMS arrived about 605 and patient was flaccid unresponsive and being held in place in the chair.  EMS checked her blood sugar and it was 140.  Patient was incontinent of urine.  They report that when they got her on the truck that she was slightly confused but was able to answer their questions and was alert and oriented fairly quickly.  She currently complains of feeling generalized weakness but otherwise has not had any problem with no rebleeding. No problems with nasal packing or bleeding.    Home Medications Prior to Admission medications   Medication Sig Start Date End Date Taking? Authorizing Provider  acetaminophen (TYLENOL) 325 MG tablet Take 2 tablets (650 mg total) by mouth every 6 (six) hours as needed for moderate pain. 08/09/20   Wallis Bamberg, PA-C  Ascorbic Acid (VITAMIN C PO) Take 1,000 mg by mouth in the morning and at bedtime.    [provider]  aspirin 81 MG tablet Take 81 mg by mouth daily.    [provider]  calcium carbonate (OS-CAL) 600 MG TABS Take 600 mg by mouth daily.    [provider]  CINNAMON PO Take 500 mg by mouth 2 (two) times daily.    [provider]  ezetimibe (ZETIA) 10 MG tablet TAKE 1 TABLET BY MOUTH DAILY 05/12/22   Rema Fendt, NP   metoprolol tartrate (LOPRESSOR) 25 MG tablet TAKE 1 TABLET BY MOUTH TWICE  DAILY 05/12/22   Rema Fendt, NP  Omega-3 Fatty Acids (FISH OIL) 1000 MG CPDR Take 1,000 mg by mouth daily.    [provider]  omeprazole (PRILOSEC) 40 MG capsule TAKE 1 CAPSULE BY MOUTH DAILY 03/25/22   Rema Fendt, NP  QUERCETIN PO Take 1,500 mg by mouth daily.    [provider]  spironolactone (ALDACTONE) 25 MG tablet Take 0.5 tablets (12.5 mg total) by mouth daily. 02/02/22   Meriam Sprague, MD  Turmeric 500 MG CAPS Take by mouth daily.    [provider]  VITAMIN A PO Take 10,000 mg by mouth.    [provider]  vitamin B-12 (CYANOCOBALAMIN) 1000 MCG tablet Take 1,000 mcg by mouth daily.    [provider]  VITAMIN D, CHOLECALCIFEROL, PO Take 1 tablet by mouth daily.    [provider]  Zinc Acetate, Oral, (ZINC ACETATE PO) Take 1 tablet by mouth.    [provider]  Zinc Oxide 40 % PSTE Apply 1 application topically as needed. 09/22/19   Belinda Fisher, PA-C      Allergies    Cymbalta [duloxetine hcl], Farxiga [dapagliflozin], Gabapentin, Statins, Sulfa antibiotics, and Zocor [simvastatin]    Review of Systems   Review of Systems  Physical Exam Updated Vital Signs BP 132/84   Pulse 71  Temp (!) 97.5 F (36.4 C) (Oral)   Resp 20   SpO2 98%  Physical Exam Vitals and nursing note reviewed.  Constitutional:      General: She is not in acute distress.    Appearance: Normal appearance.  HENT:     Head: Normocephalic.     Right Ear: External ear normal.     Left Ear: External ear normal.     Nose:     Comments: Nasal packing in place right nares no bleeding noted    Mouth/Throat:     Mouth: Mucous membranes are moist.     Pharynx: Oropharynx is clear.  Eyes:     Pupils: Pupils are equal, round, and reactive to light.  Cardiovascular:     Rate and Rhythm: Normal rate and regular rhythm.     Pulses: Normal pulses.     Heart  sounds: Normal heart sounds.  Pulmonary:     Effort: Pulmonary effort is normal.     Breath sounds: Normal breath sounds.  Abdominal:     General: Abdomen is flat. Bowel sounds are normal.     Palpations: Abdomen is soft.  Musculoskeletal:        General: Normal range of motion.     Cervical back: Normal range of motion.  Skin:    General: Skin is warm.     Capillary Refill: Capillary refill takes less than 2 seconds.  Neurological:     General: No focal deficit present.     Mental Status: She is alert.     Cranial Nerves: No cranial nerve deficit.     Motor: No weakness.  Psychiatric:        Mood and Affect: Mood normal.     ED Results / Procedures / Treatments   Labs (all labs ordered are listed, but only abnormal results are displayed) Labs Reviewed  CBC  COMPREHENSIVE METABOLIC PANEL WITH GFR  BASIC METABOLIC PANEL WITH GFR  CBC WITH DIFFERENTIAL/PLATELET  CBG MONITORING, ED  TROPONIN I (HIGH SENSITIVITY)    EKG EKG Interpretation Date/Time:  Monday April 19 2023 07:01:08 EDT Ventricular Rate:  70 PR Interval:  240 QRS Duration:  147 QT Interval:  496 QTC Calculation: 536 R Axis:   22  Text Interpretation: Sinus rhythm Prolonged PR interval Left bundle branch block Confirmed by Margarita Grizzle [phone]) on 04/19/2023 7:11:44 AM  Radiology No results found.  Procedures Procedures  {Document cardiac monitor, telemetry assessment procedure when appropriate:1}  Medications Ordered in ED Medications - No data to display  ED Course/ Medical Decision Making/ A&P Clinical Course as of 04/19/23 1026  Mon Apr 19, 2023  0827 First troponin is elevated at 21 [DR]  0946 CT head reviewed and no evidence of acute abnormality noted on my interpretation and awaiting radiologist interpretation [DR]    Clinical Course User Index [DR] Margarita Grizzle, MD   {   Click here for ABCD2, HEART and other calculatorsREFRESH Note before signing :1}                               Medical Decision Making Amount and/or Complexity of Data Reviewed Labs: ordered. Radiology: ordered.  Risk OTC drugs.   Patient reports that she had short episode of chest discomfort at left mid axillary line below left breast. 3 minutes of chest pressure.  She reports that she has had these episodes multiple times in the past.  She reports feeling very  anxious. No problems with nasal packing or bleeding. DDX Syncope-cardiac, low flow, anema, heart failure- no evidence of arrhythmia on monitor here and no report of the hospital back instability EKG does have sinus rhythm with left bundle branch block.  Troponins are 21 and 23 without significant upward trend here in ED.  Patient had some very nonspecific chest discomfort while here.  She will need to remain on monitor and have troponins trended. Hemoglobin is stable here at 12.7 Mild natremia  CNS etiology of altered mental status including seizure.  There is no obvious seizure activity noted.  Patient did lose control of her bladder.  Does not appear to be any significant postictal phase as EMS reports that she was fairly coherent just and she was able to speak to them. Plan admission for ongoing evaluation and treatment  1 syncope with extended onset.-Noted the etiology found here in ED see above for 9 2- nosebleed patient with nasal packing in place from yesterday no active bleeding  {Document critical care time when appropriate:1} {Document review of labs and clinical decision tools ie heart score, Chads2Vasc2 etc:1}  {Document your independent review of radiology images, and any outside records:1} {Document your discussion with family members, caretakers, and with consultants:1} {Document social determinants of health affecting pt's care:1} {Document your decision making why or why not admission, treatments were needed:1} Final Clinical Impression(s) / ED Diagnoses Final diagnoses:  None    Rx / DC Orders ED Discharge Orders      None

## 2023-04-19 NOTE — Progress Notes (Addendum)
 Discussed with Dr. Ernesta Heading.  In brief this is an 89 year old woman who is followed by Dr. Reyna Cava for neuropathy  She felt dizzy prior to losing consciousness while sitting down, remained unconscious for approximately 30 minutes but still in an upright/slumped forward position with her head above her heart.  On EMS arrival she was laid flat regained consciousness without any confusion.  Did have some urinary incontinence noted on the couch.  No prior episodes concerning for seizure, and she has been at her baseline during hospitalization  Orthostatic VS for the past 24 hrs:  BP- Lying Pulse- Lying BP- Sitting Pulse- Sitting BP- Standing at 0 minutes Pulse- Standing at 0 minutes  04/19/23 0759 143/62 66 158/65 66 118/82 86  However at 3 min 150/77 with HR 81    Basic Metabolic Panel: Recent Labs  Lab 04/19/23 0820  NA 133*  K 4.3  CL 99  CO2 23  GLUCOSE 101*  BUN 16  CREATININE 0.85  CALCIUM 9.5    CBC: Recent Labs  Lab 04/19/23 0711  WBC 4.9  4.9  NEUTROABS 3.5  HGB 12.7  12.8  HCT 37.8  38.1  MCV 87.3  87.4  PLT 333  310     I am asked for workup recommendations.  I think it is reasonable to obtain a routine EEG and an MRI brain with and without contrast.  If these are reassuring, outpatient follow-up is appropriate and would not recommend starting antiseizure medications at this time  Primary team to reach out to neurology if any abnormalities on EEG or MRI, or if other acute neurological concerns arise while patient is in the hospital  Appreciate primary team's workup for other causes of syncope and management of comorbidities  Baldwin Levee MD-PhD [facility] [phone]   8 minutes spent in care, majority and discussion with Dr. Consuela Denier who will notify patient of neurological consultation by phone   These are curbside recommendations based upon the information readily available in the chart on brief review as well as history and  examination information provided to me by requesting provider and do not replace a full detailed consult   Basic Metabolic Panel: Recent Labs  Lab 04/19/23 0820  NA 133*  K 4.3  CL 99  CO2 23  GLUCOSE 101*  BUN 16  CREATININE 0.85  CALCIUM 9.5    CBC: Recent Labs  Lab 04/19/23 0711  WBC 4.9  4.9  NEUTROABS 3.5  HGB 12.7  12.8  HCT 37.8  38.1  MCV 87.3  87.4  PLT 333  310    Coagulation Studies: No results for input(s): "LABPROT", "INR" in the last 72 hours.

## 2023-04-19 NOTE — H&P (Addendum)
 History and Physical    Patient: Sydney Booth:[id number] DOB: Jan 28, 1931 DOA: 04/19/2023 DOS: the patient was seen and examined on 04/19/2023 PCP: Renford Dills, MD  Patient coming from: Home  Chief Complaint:  Chief Complaint  Patient presents with   Loss of Consciousness   HPI: Sydney Booth is a 89 y.o. female with medical history significant of HTN, HLD, OSA on CPAP, GERD presenting to the ED with loss of consciousness.  Patient reports that she was seen yesterday in the ED for a nosebleed and had nasal packing placed by ED provider. She was fine when she got back home last night. She woke up this morning in her usual state of health. She was able to walk to the bathroom and brush her teeth. She went downstairs and her daughter made her a cup of coffee. As she was sitting at the table, she began feeling very weak which she noted to her daughter. She subsequently lost conscious on the table. Per daughter, patient lost consciousness for about 30 minutes. Daughter was able to call EMS who arrived on scene to note patient was unresponsive. Daughter does report urinary incontinence during episode of LOC. States that she did not notice any obvious body shaking/jerking or tongue biting. Daughter does report that patient was complaining of a posterior headache since last night and into this morning. Otherwise, patient does note mild left ribcage discomfort that is positional per patient. Patient denies any nausea, vomiting, fevers, chills, chest pain, palpitations, SHOB, cough, abdominal pain.  ED course: vital signs stable. CBC unremarkable. CMP with mild hyponatremia, otherwise unremarkable. Troponins 21 > 23. EKG with sinus rhythm and LBBB (noted on prior EKGs). CT head negative for any acute intracranial abnormalities. TRH asked to evaluate patient for admission.  Review of Systems: As mentioned in the history of present illness. All other systems reviewed and are negative. Past Medical  History:  Diagnosis Date   Arthritis    Blood transfusion    Cervical vertebral fracture (HCC)    fued on its on   Closed head injury    as a chlid   Coronary artery calcification seen on CT scan    Fall 12/22/2017   right eye bruised   Gastric ulcer    GERD (gastroesophageal reflux disease)    H/O hiatal hernia    Headache(784.0)    Hyperlipemia    Hypertension    Left carpal tunnel syndrome 06/08/2018   Mild pulmonary hypertension (HCC)    Mitral regurgitation    Moderate by 2D echo 08/2022   Mitral stenosis    Mild by 2D echo 08/2022 with mean mitral valve gradient 4 mmHg   Mitral valve prolapse    Obesity    OSA on CPAP    Osteopenia    Rotator cuff tear    Left   Seasonal allergies    Shortness of breath    With activity   Ulcer 1991   [place]    Past Surgical History:  Procedure Laterality Date   BIospy      Biospy      Uterus wall   [place] Tunnel     CATARACT EXTRACTION     Colonscopy     EYE SURGERY     Right 2011, Left 2008   LIPOMA EXCISION     FROM BACK    THUMB FUSION     TOTAL KNEE ARTHROPLASTY  03/24/2011   Procedure: TOTAL KNEE ARTHROPLASTY;  Surgeon: Velna Ochs, MD;  Location: [facility] OR;  Service: Orthopedics;  Laterality: Left;  with revision tibial component   TUBAL LIGATION     Social History:  reports that she has never smoked. She has never been exposed to tobacco smoke. She has never used smokeless tobacco. She reports that she does not drink alcohol and does not use drugs.  Allergies  Allergen Reactions   Cymbalta [Duloxetine Hcl]    Farxiga [Dapagliflozin]     Hoarsness/Dizziness   Gabapentin     dizzy   Statins Other (See Comments)   Sulfa Antibiotics Hives, Other (See Comments) and Nausea And Vomiting   Zocor [Simvastatin]     Muscle aches    Family History  Problem Relation Age of Onset   Stroke Mother    Cancer Father        COLON, LIVER AND PANCREAS   Heart disease Brother    Sleep apnea Brother    Sleep apnea  Brother    Pulmonary embolism Brother    ALS Son    Tourette syndrome Son    Healthy Daughter    Autism Grandson    Anesthesia problems Neg Hx    Breast cancer Neg Hx     Prior to Admission medications   Medication Sig Start Date End Date Taking? Authorizing Provider  acetaminophen (TYLENOL) 325 MG tablet Take 2 tablets (650 mg total) by mouth every 6 (six) hours as needed for moderate pain. 08/09/20   Wallis Bamberg, PA-C  Ascorbic Acid (VITAMIN C PO) Take 1,000 mg by mouth in the morning and at bedtime.    [provider]  aspirin 81 MG tablet Take 81 mg by mouth daily.    [provider]  calcium carbonate (OS-CAL) 600 MG TABS Take 600 mg by mouth daily.    [provider]  CINNAMON PO Take 500 mg by mouth 2 (two) times daily.    [provider]  ezetimibe (ZETIA) 10 MG tablet TAKE 1 TABLET BY MOUTH DAILY 05/12/22   Rema Fendt, NP  metoprolol tartrate (LOPRESSOR) 25 MG tablet TAKE 1 TABLET BY MOUTH TWICE  DAILY 05/12/22   Rema Fendt, NP  Omega-3 Fatty Acids (FISH OIL) 1000 MG CPDR Take 1,000 mg by mouth daily.    [provider]  omeprazole (PRILOSEC) 40 MG capsule TAKE 1 CAPSULE BY MOUTH DAILY 03/25/22   Rema Fendt, NP  QUERCETIN PO Take 1,500 mg by mouth daily.    [provider]  spironolactone (ALDACTONE) 25 MG tablet Take 0.5 tablets (12.5 mg total) by mouth daily. 02/02/22   Meriam Sprague, MD  Turmeric 500 MG CAPS Take by mouth daily.    [provider]  VITAMIN A PO Take 10,000 mg by mouth.    [provider]  vitamin B-12 (CYANOCOBALAMIN) 1000 MCG tablet Take 1,000 mcg by mouth daily.    [provider]  VITAMIN D, CHOLECALCIFEROL, PO Take 1 tablet by mouth daily.    [provider]  Zinc Acetate, Oral, (ZINC ACETATE PO) Take 1 tablet by mouth.    [provider]  Zinc Oxide 40 % PSTE Apply 1 application topically as needed. 09/22/19   Belinda Fisher, PA-C    Physical  Exam: Vitals:   04/19/23 0830 04/19/23 0900 04/19/23 0921 04/19/23 1000  BP: (!) 141/66 (!) 144/62  134/60  Pulse: 64 66  63  Resp: 18 10  16   Temp:      TempSrc:  SpO2: 100% 100%  100%  Weight:   90.7 kg   Height:   5' (1.524 m)    Physical Exam Constitutional:      Appearance: Normal appearance. She is obese.  HENT:     Head: Normocephalic and atraumatic.     Nose:     Comments: Right sided nasal packing in place. No active bleeding noted.    Mouth/Throat:     Mouth: Mucous membranes are moist.     Pharynx: Oropharynx is clear. No oropharyngeal exudate.  Eyes:     General: No scleral icterus.    Extraocular Movements: Extraocular movements intact.     Pupils: Pupils are equal, round, and reactive to light.  Cardiovascular:     Rate and Rhythm: Normal rate and regular rhythm.     Heart sounds: Normal heart sounds. No murmur heard.    No friction rub. No gallop.  Pulmonary:     Effort: Pulmonary effort is normal. No respiratory distress.     Breath sounds: Normal breath sounds. No wheezing, rhonchi or rales.  Chest:     Comments: Mild TTP of upper left ribcage, reproducible. Abdominal:     General: Bowel sounds are normal. There is no distension.     Palpations: Abdomen is soft.     Tenderness: There is no abdominal tenderness. There is no guarding or rebound.  Musculoskeletal:        General: Normal range of motion.     Cervical back: Normal range of motion.  Skin:    General: Skin is warm and dry.  Neurological:     General: No focal deficit present.     Mental Status: She is alert and oriented to person, place, and time.     Comments: AAOx3. No aphasia, slurred speech, or dysarthria noted. PERRL, EOMI, no nystagmus noted. Visual fields full. Facial sensation intact. Facial muscles intact. Shoulder shrug symmetric. Uvula midline. Tongue protrusion midline. Strength 4+/5 in all extremities. Sensation intact bilaterally throughout. Gait testing deferred.   Psychiatric:        Mood and Affect: Mood normal.        Behavior: Behavior normal.     Data Reviewed:  There are no new results to review at this time.    Latest Ref Rng & Units 04/19/2023    7:11 AM 09/29/2021   11:28 AM 12/10/2020    4:45 PM  CBC  WBC 4.0 - 10.5 K/uL 4.0 - 10.5 K/uL 4.9    4.9  4.2  5.9   Hemoglobin 12.0 - 15.0 g/dL 40.9 - 81.1 g/dL 91.4    78.2  95.6  21.3   Hematocrit 36.0 - 46.0 % 36.0 - 46.0 % 38.1    37.8  39.4  39.1   Platelets 150 - 400 K/uL 150 - 400 K/uL 310    333  286  327       Latest Ref Rng & Units 04/19/2023    8:20 AM 03/16/2022    9:28 AM 03/02/2022    9:45 AM  CMP  Glucose 70 - 99 mg/dL 086  91  93   BUN 8 - 23 mg/dL 16  14  9    Creatinine 0.44 - 1.00 mg/dL 5.78  4.69  6.29   Sodium 135 - 145 mmol/L 133  132  131   Potassium 3.5 - 5.1 mmol/L 4.3  4.8  5.1   Chloride 98 - 111 mmol/L 99  95  94   CO2 22 - 32  mmol/L 23  25  22    Calcium 8.9 - 10.3 mg/dL 9.5  9.6  9.6   Total Protein 6.5 - 8.1 g/dL 6.8     Total Bilirubin 0.0 - 1.2 mg/dL 0.8     Alkaline Phos 38 - 126 U/L 93     AST 15 - 41 U/L 22     ALT 0 - 44 U/L 12      CT Head Wo Contrast Result Date: 04/19/2023 CLINICAL DATA:  Mental status changes.  Syncopal episode. EXAM: CT HEAD WITHOUT CONTRAST TECHNIQUE: Contiguous axial images were obtained from the base of the skull through the vertex without intravenous contrast. RADIATION DOSE REDUCTION: This exam was performed according to the departmental dose-optimization program which includes automated exposure control, adjustment of the mA and/or kV according to patient size and/or use of iterative reconstruction technique. COMPARISON:  12/24/2017. FINDINGS: Brain: There is no evidence for acute hemorrhage, hydrocephalus, mass lesion, or abnormal extra-axial fluid collection. No definite CT evidence for acute infarction. Diffuse loss of parenchymal volume is consistent with atrophy. Patchy low attenuation in the deep hemispheric and  periventricular white matter is nonspecific, but likely reflects chronic microvascular ischemic demyelination. Vascular: No hyperdense vessel or unexpected calcification. Skull: No evidence for fracture. No worrisome lytic or sclerotic lesion. Sinuses/Orbits: The visualized paranasal sinuses and mastoid air cells are clear. Visualized portions of the globes and intraorbital fat are unremarkable. Other: None. IMPRESSION: 1. No acute intracranial abnormality. 2. Atrophy with chronic small vessel ischemic disease. Electronically Signed   By: Kennith Center M.D.   On: 04/19/2023 10:19    Assessment and Plan: No notes have been filed under this hospital service. Service: Hospitalist  Syncope, likely orthostatic Patient presenting after experiencing LOC for about 30 minutes with associated urinary incontinence. Her only prodromal symptom was feeling generally weak prior to episode. No post-ictal state noted per reports. Per daughter, no reported seizure history. No focal deficits on exam and patient is fully oriented. CT head negative. Orthostatic vitals were positive in the ED (lying 143/62, sitting 158/65, standing 118/82), likely culprit of syncopal episode. She is hemodynamically stable at this time. Discussed with neurology, who recommended obtaining EEG and MRI brain for further evaluation and to reach out to neurology for formal consult if either of these are positive. -500cc IVF bolus -f/u ECHO, EEG -f/u MRI brain w and wo contrast -telemetry -repeat orthostatic vitals tomorrow -formally consult neurology if EEG or MRI positive -lovenox for dvt ppx -PT/OT eval -place in observation  NSTEMI -Troponin 21 > 23 -EKG with sinus rhythm and LBBB (similar to priors), no obvious ischemic changes noted -suspect this is likely demand ischemia in the setting of above -will continue trending troponins until peak  Mild hyponatremia -suspect hypovolemic -providing 500cc IVF bolus for rehydration -trend  sodium level  Epistaxis s/p nasal packing -stable -discussed with ENT, recommended leaving in place for 5 days before removal  Mild left ribcage pain -unclear etiology -CXR without any signs of fracture -tylenol q6h prn for pain control  HTN Patient on metoprolol tartrate 25mg  BID. BP ranging in 130s-140s/50s-60s since arrival to ED. Will resume home medication. Spironolactone listed in medication list but patient no longer taking and no recent dispenses per pharmacy. -resume home metoprolol tartrate -trend BP curve  HLD -resume home zetia 10mg  daily  GERD -resume home protonix 40mg  daily   Advance Care Planning:   Code Status: Full Code   Consults: none  Family Communication: updated family at bedside  Severity of Illness: The appropriate patient status for this patient is OBSERVATION. Observation status is judged to be reasonable and necessary in order to provide the required intensity of service to ensure the patient's safety. The patient's presenting symptoms, physical exam findings, and initial radiographic and laboratory data in the context of their medical condition is felt to place them at decreased risk for further clinical deterioration. Furthermore, it is anticipated that the patient will be medically stable for discharge from the hospital within 2 midnights of admission.   Portions of this note were generated with Scientist, clinical (histocompatibility and immunogenetics). Dictation errors may occur despite best attempts at proofreading.  Author: Mandy Second, MD 04/19/2023 10:52 AM  For on call review [email or web address].

## 2023-04-19 NOTE — ED Triage Notes (Signed)
 Pt comes from home via [facility] for syncopal episode with incontinence episode, pt was initially unresponsive with EMS and then woke up confused, now AxO x 4.

## 2023-04-19 NOTE — ED Notes (Signed)
 Patient transported to MRI

## 2023-04-20 DIAGNOSIS — R55 Syncope and collapse: Secondary | ICD-10-CM | POA: Diagnosis not present

## 2023-04-20 LAB — BASIC METABOLIC PANEL WITH GFR
Anion gap: 7 (ref 5–15)
BUN: 12 mg/dL (ref 8–23)
CO2: 27 mmol/L (ref 22–32)
Calcium: 9.2 mg/dL (ref 8.9–10.3)
Chloride: 100 mmol/L (ref 98–111)
Creatinine, Ser: 0.79 mg/dL (ref 0.44–1.00)
GFR, Estimated: >60 mL/min (ref >60)
Glucose, Bld: 100 mg/dL — ABNORMAL HIGH (ref 70–99)
Potassium: 4.2 mmol/L (ref 3.5–5.1)
Sodium: 134 mmol/L — ABNORMAL LOW (ref 135–145)

## 2023-04-20 LAB — CBC
HCT: 36 % (ref 36.0–46.0)
Hemoglobin: 12.1 g/dL (ref 12.0–15.0)
MCH: 28.8 pg (ref 26.0–34.0)
MCHC: 33.6 g/dL (ref 30.0–36.0)
MCV: 85.7 fL (ref 80.0–100.0)
Platelets: 302 10*3/uL (ref 150–400)
RBC: 4.2 MIL/uL (ref 3.87–5.11)
RDW: 14.4 % (ref 11.5–15.5)
WBC: 5.1 10*3/uL (ref 4.0–10.5)
nRBC: 0 % (ref 0.0–0.2)

## 2023-04-20 LAB — GLUCOSE, CAPILLARY
Glucose-Capillary: 98 mg/dL (ref 70–99)
Glucose-Capillary: 104 mg/dL — ABNORMAL HIGH (ref 70–99)

## 2023-04-20 NOTE — Discharge Summary (Signed)
 Physician Discharge Summary  Aaron Boeh ZOX:[id number] DOB: [birth year] DOA: 04/19/2023  PCP: Renford Dills, MD  Admit date: 04/19/2023 Discharge date: 04/20/2023  Admitted From: Home Disposition:  Home  Discharge Condition:Stable CODE STATUS:FULL Diet recommendation: Heart Healthy   Brief/Interim Summary: Patient  is a 89 y.o. female with medical history significant of HTN, HLD, OSA on CPAP, GERD presenting to the ED with loss of consciousness.  She was recently seen in the ED on 4/13 when she presented with nosebleed.  Nasal pack was applied in the emergency department.  Nosebleed has resolved.  Patient was admitted for the further evaluation of syncopal episode.  MRI of the brain did not show any acute findings.  EEG did not show any seizure.  EKG did not show any signs of ischemic changes.  Echocardiogram is stable with normal EF, no wall motion abnormality.  Patient seen by physical therapy today, no follow-up recommended.  Orthostatic vitals are  negative this morning.  She is ambulating without any problem with the help of the walker which is her baseline.  Medically stable for discharge home today  Following problems were addressed during the hospitalization:   Syncope, likely orthostatic Patient presenting after experiencing LOC for about 30 minutes with associated urinary incontinence. Her only prodromal symptom was feeling generally weak prior to episode. No post-ictal state noted per reports. Per daughter, no reported seizure history. No focal deficits on exam and patient is fully oriented. CT head negative. Orthostatic vitals were positive in the ED (lying 143/62, sitting 158/65, standing 118/82), likely culprit of syncopal episode. She is hemodynamically stable at this time. MRI of the brain did not show any acute findings.  EEG did not show any seizure.  EKG did not show any signs of ischemic changes.  Echocardiogram is stable with normal EF, no wall motion abnormality.  Patient  seen by physical therapy today, no follow-up recommended.  Orthostatic vitals are  negative this morning.  She is ambulating without any problem with the help of the walker which is her baseline  NSTEMI -Troponin 21 > 23 -EKG with sinus rhythm and LBBB (similar to priors), no obvious ischemic changes noted -suspect this is likely demand ischemia in the setting of above -No chest pain   Mild hyponatremia -stable   Epistaxis s/p nasal packing -stable -discussed with ENT, recommended leaving in place for 5 days before removal -We have instructed the patient to go to PCPs office on Friday to remove wick   Mild left ribcage pain -unclear etiology -CXR without any signs of fracture -No pain todau   HTN Patient on metoprolol tartrate 25mg  BID. BP ranging in 130s-140s/50s-60s since arrival to ED. Will resume home medication.   HLD -resume home zetia 10mg  daily   GERD -resume home protonix 40mg  daily  Discharge Diagnoses:  Principal Problem:   Syncope    Discharge Instructions  Discharge Instructions     Diet - low sodium heart healthy   Complete by: As directed    Discharge instructions   Complete by: As directed    1)Please follow up with your PCP on Friday to remove the right nasal pack   Increase activity slowly   Complete by: As directed       Allergies as of 04/20/2023       Reactions   Cymbalta [duloxetine Hcl] Other (See Comments)   Advised by MD to not take   [place] [dapagliflozin] Other (See Comments)   Hoarsness/Dizziness   Gabapentin Other (See Comments)  dizzy   Statins Other (See Comments)   Muscle pain   Sulfa Antibiotics Hives, Nausea And Vomiting        Medication List     STOP taking these medications    VITAMIN A PO       TAKE these medications    acetaminophen 325 MG tablet Commonly known as: Tylenol Take 2 tablets (650 mg total) by mouth every 6 (six) hours as needed for moderate pain.   aspirin 81 MG tablet Take 81 mg by  mouth daily.   calcium carbonate 600 MG Tabs tablet Commonly known as: OS-CAL Take 600 mg by mouth daily.   CINNAMON PO Take 500 mg by mouth 2 (two) times daily.   cyanocobalamin 1000 MCG tablet Commonly known as: VITAMIN B12 Take 1,000 mcg by mouth daily.   ezetimibe 10 MG tablet Commonly known as: ZETIA TAKE 1 TABLET BY MOUTH DAILY   Fish Oil 1000 MG Cpdr Take 1,000 mg by mouth in the morning, at noon, and at bedtime.   metoprolol tartrate 25 MG tablet Commonly known as: LOPRESSOR TAKE 1 TABLET BY MOUTH TWICE  DAILY   omeprazole 40 MG capsule Commonly known as: PRILOSEC TAKE 1 CAPSULE BY MOUTH DAILY   QUERCETIN PO Take 2 capsules by mouth 3 (three) times daily as needed (Allergies).   VITAMIN C PO Take 1,000 mg by mouth in the morning and at bedtime.   VITAMIN D (CHOLECALCIFEROL) PO Take 1 tablet by mouth daily.   ZINC ACETATE PO Take 1 tablet by mouth.        Follow-up Information     Merl Star, MD. Schedule an appointment as soon as possible for a visit in 1 week(s).   Specialty: Internal Medicine Contact information: [address] [place] [address] [phone]                Allergies  Allergen Reactions   Cymbalta [Duloxetine Hcl] Other (See Comments)    Advised by MD to not take   [place] [Dapagliflozin] Other (See Comments)    Hoarsness/Dizziness   Gabapentin Other (See Comments)    dizzy   Statins Other (See Comments)    Muscle pain   Sulfa Antibiotics Hives and Nausea And Vomiting    Consultations: None   Procedures/Studies: MR BRAIN W WO CONTRAST Result Date: 04/19/2023 CLINICAL DATA:  Mental status change, unknown cause.  Syncope. EXAM: MRI HEAD WITHOUT AND WITH CONTRAST TECHNIQUE: Multiplanar, multiecho pulse sequences of the brain and surrounding structures were obtained without and with intravenous contrast. CONTRAST:  9mL GADAVIST GADOBUTROL 1 MMOL/ML IV SOLN COMPARISON:  Head CT 04/19/2023 and  MRI 12/22/2017 FINDINGS: The study is intermittently up to moderately motion degraded. Brain: There is no evidence of an acute infarct, mass, midline shift, or extra-axial fluid collection. Chronic microhemorrhages in the left parietal and right occipital lobe are new from 2019 and nonspecific. T2 hyperintensities in the cerebral white matter bilaterally have mildly progressed from the prior MRI and are nonspecific but compatible with mild chronic small vessel ischemic disease. No abnormal enhancement is identified. There is mild-to-moderate cerebral atrophy. The hippocampi are grossly symmetric in size and signal on motion degraded dedicated temporal lobe imaging. Vascular: Major intracranial vascular flow voids are preserved. Skull and upper cervical spine: Unremarkable bone marrow signal. Sinuses/Orbits: Bilateral cataract extraction. No significant inflammatory changes in the paranasal sinuses. Other: None. IMPRESSION: 1. Motion degraded examination. 2. No acute intracranial abnormality. 3. Mild chronic small vessel ischemic disease.  Electronically Signed   By: Aundra Lee M.D.   On: 04/19/2023 16:19   ECHOCARDIOGRAM COMPLETE Result Date: 04/19/2023    ECHOCARDIOGRAM REPORT   Patient Name:   Geriann LOUISE Thoma Date of Exam: 04/19/2023 Medical Rec #:  [id number]       Height:       60.0 in Accession #:    [id number]      Weight:       200.0 lb Date of Birth:  10-02-1931       BSA:          1.866 m Patient Age:    89 years        BP:           141/58 mmHg Patient Gender: F               HR:           85 bpm. Exam Location:  Inpatient Procedure: 2D Echo, Cardiac Doppler and Color Doppler (Both Spectral and Color            Flow Doppler were utilized during procedure). Indications:    Syncope  History:        Patient has prior history of Echocardiogram examinations, most                 recent 08/24/2022. Mitral Valve Disease; Risk                 Factors:Hypertension.  Sonographer:    Astrid Blamer Referring Phys:  [id number] SAGAR H JINWALA IMPRESSIONS  1. Left ventricular ejection fraction, by estimation, is 60 to 65%. The left ventricle has normal function. The left ventricle has no regional wall motion abnormalities. Left ventricular diastolic parameters are consistent with Grade II diastolic dysfunction (pseudonormalization). Elevated left atrial pressure.  2. Right ventricular systolic function is normal. The right ventricular size is normal.  3. Left atrial size was moderately dilated.  4. The mitral valve is degenerative. Mild mitral valve regurgitation. Mild mitral stenosis. The mean mitral valve gradient is 4.0 mmHg. Severe mitral annular calcification.  5. The aortic valve is normal in structure. Aortic valve regurgitation is not visualized. Mild aortic valve stenosis. Aortic valve mean gradient measures 13.0 mmHg. Aortic valve Vmax measures 2.26 m/s.  6. The inferior vena cava is normal in size with greater than 50% respiratory variability, suggesting right atrial pressure of 3 mmHg. FINDINGS  Left Ventricle: Left ventricular ejection fraction, by estimation, is 60 to 65%. The left ventricle has normal function. The left ventricle has no regional wall motion abnormalities. The left ventricular internal cavity size was normal in size. There is  no left ventricular hypertrophy. Left ventricular diastolic parameters are consistent with Grade II diastolic dysfunction (pseudonormalization). Elevated left atrial pressure. Right Ventricle: The right ventricular size is normal. No increase in right ventricular wall thickness. Right ventricular systolic function is normal. Left Atrium: Left atrial size was moderately dilated. Right Atrium: Right atrial size was normal in size. Pericardium: There is no evidence of pericardial effusion. Mitral Valve: The mitral valve is degenerative in appearance. Severe mitral annular calcification. Mild mitral valve regurgitation. Mild mitral valve stenosis. MV peak gradient, 10.2 mmHg. The  mean mitral valve gradient is 4.0 mmHg. Tricuspid Valve: The tricuspid valve is normal in structure. Tricuspid valve regurgitation is mild . No evidence of tricuspid stenosis. Aortic Valve: The aortic valve is normal in structure. Aortic valve regurgitation is not visualized. Mild aortic stenosis is present. Aortic valve mean  gradient measures 13.0 mmHg. Aortic valve peak gradient measures 20.4 mmHg. Aortic valve area, by VTI measures 2.23 cm. Pulmonic Valve: The pulmonic valve was normal in structure. Pulmonic valve regurgitation is not visualized. No evidence of pulmonic stenosis. Aorta: The aortic root is normal in size and structure. Venous: The inferior vena cava is normal in size with greater than 50% respiratory variability, suggesting right atrial pressure of 3 mmHg. IAS/Shunts: No atrial level shunt detected by color flow Doppler.  LEFT VENTRICLE PLAX 2D LVIDd:         4.00 cm   Diastology LVIDs:         2.60 cm   LV e' medial:    3.59 cm/s LV PW:         1.00 cm   LV E/e' medial:  26.8 LV IVS:        0.90 cm   LV e' lateral:   5.33 cm/s LVOT diam:     2.00 cm   LV E/e' lateral: 18.0 LV SV:         96 LV SV Index:   51 LVOT Area:     3.14 cm  RIGHT VENTRICLE RV S prime:     12.90 cm/s TAPSE (M-mode): 2.7 cm LEFT ATRIUM             Index        RIGHT ATRIUM           Index LA Vol (A2C):   95.0 ml 50.90 ml/m  RA Area:     16.70 cm LA Vol (A4C):   53.4 ml 28.61 ml/m  RA Volume:   37.60 ml  20.15 ml/m LA Biplane Vol: 74.2 ml 39.75 ml/m  AORTIC VALVE AV Area (Vmax):    2.31 cm AV Area (Vmean):   2.27 cm AV Area (VTI):     2.23 cm AV Vmax:           226.00 cm/s AV Vmean:          170.500 cm/s AV VTI:            0.430 m AV Peak Grad:      20.4 mmHg AV Mean Grad:      13.0 mmHg LVOT Vmax:         166.00 cm/s LVOT Vmean:        123.000 cm/s LVOT VTI:          0.305 m LVOT/AV VTI ratio: 0.71 MITRAL VALVE                TRICUSPID VALVE MV Area (PHT): 3.39 cm     TR Peak grad:   29.8 mmHg MV Area VTI:   2.53  cm     TR Vmax:        273.00 cm/s MV Peak grad:  10.2 mmHg MV Mean grad:  4.0 mmHg     SHUNTS MV Vmax:       1.60 m/s     Systemic VTI:  0.30 m MV Vmean:      101.0 cm/s   Systemic Diam: 2.00 cm MV Decel Time: 224 msec MV E velocity: 96.20 cm/s MV A velocity: 146.00 cm/s MV E/A ratio:  0.66 Arta Lark Electronically signed by Arta Lark Signature Date/Time: 04/19/2023/3:26:30 PM    Final    EEG adult Result Date: 04/19/2023 Arleene Lack, MD     04/19/2023 12:59 PM Patient Name: Kashlynn Kundert MRN: [id number] Epilepsy Attending: Arleene Lack Referring Physician/Provider:  Briscoe Burns, MD Date: 04/19/2023 Duration: 24 mins Patient history: 89yo F with LOC. EEG to evaluate for seizure Level of alertness: Awake AEDs during EEG study: None Technical aspects: This EEG study was done with scalp electrodes positioned according to the 10-20 International system of electrode placement. Electrical activity was reviewed with band pass filter of 1-70Hz , sensitivity of 7 uV/mm, display speed of 76mm/sec with a 60Hz  notched filter applied as appropriate. EEG data were recorded continuously and digitally stored.  Video monitoring was available and reviewed as appropriate. Description: The posterior dominant rhythm consists of 8 Hz activity of moderate voltage (25-35 uV) seen predominantly in posterior head regions, symmetric and reactive to eye opening and eye closing. Hyperventilation and photic stimulation were not performed.   IMPRESSION: This study is within normal limits. No seizures or epileptiform discharges were seen throughout the recording. A normal interictal EEG does not exclude the diagnosis of epilepsy. Charlsie Quest   DG CHEST PORT 1 VIEW Result Date: 04/19/2023 CLINICAL DATA:  Syncope. EXAM: PORTABLE CHEST 1 VIEW COMPARISON:  04/01/2020. FINDINGS: Low lung volume. Bilateral lung fields are clear. Bilateral costophrenic angles are clear. Mildly enlarged cardio-mediastinal silhouette  coverage is likely accentuated by low lung volumes and AP technique. No acute osseous abnormalities. The soft tissues are within normal limits. IMPRESSION: No active disease. Electronically Signed   By: Jules Schick M.D.   On: 04/19/2023 12:12   CT Head Wo Contrast Result Date: 04/19/2023 CLINICAL DATA:  Mental status changes.  Syncopal episode. EXAM: CT HEAD WITHOUT CONTRAST TECHNIQUE: Contiguous axial images were obtained from the base of the skull through the vertex without intravenous contrast. RADIATION DOSE REDUCTION: This exam was performed according to the departmental dose-optimization program which includes automated exposure control, adjustment of the mA and/or kV according to patient size and/or use of iterative reconstruction technique. COMPARISON:  12/24/2017. FINDINGS: Brain: There is no evidence for acute hemorrhage, hydrocephalus, mass lesion, or abnormal extra-axial fluid collection. No definite CT evidence for acute infarction. Diffuse loss of parenchymal volume is consistent with atrophy. Patchy low attenuation in the deep hemispheric and periventricular white matter is nonspecific, but likely reflects chronic microvascular ischemic demyelination. Vascular: No hyperdense vessel or unexpected calcification. Skull: No evidence for fracture. No worrisome lytic or sclerotic lesion. Sinuses/Orbits: The visualized paranasal sinuses and mastoid air cells are clear. Visualized portions of the globes and intraorbital fat are unremarkable. Other: None. IMPRESSION: 1. No acute intracranial abnormality. 2. Atrophy with chronic small vessel ischemic disease. Electronically Signed   By: Kennith Center M.D.   On: 04/19/2023 10:19      Subjective: Patient seen and examined at bedside this afternoon.  Very comfortable.  Eager to go home.  Denies lightheadedness or dizziness.  Ambulating without any problem without a walker.  Son at the bedside  Discharge Exam: Vitals:   04/20/23 0452 04/20/23 0803   BP: (!) 161/89 (!) 156/63  Pulse: 75 85  Resp: 18 18  Temp: 97.7 F (36.5 C) (!) 97.5 F (36.4 C)  SpO2: 97% 100%   Vitals:   04/19/23 2353 04/20/23 0452 04/20/23 0456 04/20/23 0803  BP: (!) 134/106 (!) 161/89  (!) 156/63  Pulse: 73 75  85  Resp: 18 18  18   Temp: 97.6 F (36.4 C) 97.7 F (36.5 C)  (!) 97.5 F (36.4 C)  TempSrc: Oral Oral  Oral  SpO2: 94% 97%  100%  Weight:   93.4 kg   Height:  General: Pt is alert, awake, not in acute distress,obese Cardiovascular: RRR, S1/S2 +, no rubs, no gallops Respiratory: CTA bilaterally, no wheezing, no rhonchi Abdominal: Soft, NT, ND, bowel sounds + Extremities: no edema, no cyanosis    The results of significant diagnostics from this hospitalization (including imaging, microbiology, ancillary and laboratory) are listed below for reference.     Microbiology: No results found for this or any previous visit (from the past 240 hours).   Labs: BNP (last 3 results) No results for input(s): "BNP" in the last 8760 hours. Basic Metabolic Panel: Recent Labs  Lab 04/19/23 0820 04/20/23 0609  NA 133* 134*  K 4.3 4.2  CL 99 100  CO2 23 27  GLUCOSE 101* 100*  BUN 16 12  CREATININE 0.85 0.79  CALCIUM 9.5 9.2   Liver Function Tests: Recent Labs  Lab 04/19/23 0820  AST 22  ALT 12  ALKPHOS 93  BILITOT 0.8  PROT 6.8  ALBUMIN 3.4*   No results for input(s): "LIPASE", "AMYLASE" in the last 168 hours. No results for input(s): "AMMONIA" in the last 168 hours. CBC: Recent Labs  Lab 04/19/23 0711 04/20/23 0609  WBC 4.9  4.9 5.1  NEUTROABS 3.5  --   HGB 12.7  12.8 12.1  HCT 37.8  38.1 36.0  MCV 87.3  87.4 85.7  PLT 333  310 302   Cardiac Enzymes: No results for input(s): "CKTOTAL", "CKMB", "CKMBINDEX", "TROPONINI" in the last 168 hours. BNP: Invalid input(s): "POCBNP" CBG: Recent Labs  Lab 04/19/23 0801 04/19/23 2126 04/20/23 0458 04/20/23 0806  GLUCAP 88 119* 98 104*   D-Dimer No results for  input(s): "DDIMER" in the last 72 hours. Hgb A1c No results for input(s): "HGBA1C" in the last 72 hours. Lipid Profile No results for input(s): "CHOL", "HDL", "LDLCALC", "TRIG", "CHOLHDL", "LDLDIRECT" in the last 72 hours. Thyroid function studies No results for input(s): "TSH", "T4TOTAL", "T3FREE", "THYROIDAB" in the last 72 hours.  Invalid input(s): "FREET3" Anemia work up No results for input(s): "VITAMINB12", "FOLATE", "FERRITIN", "TIBC", "IRON", "RETICCTPCT" in the last 72 hours. Urinalysis    Component Value Date/Time   COLORURINE STRAW (A) 12/22/2017 1444   APPEARANCEUR CLEAR 12/22/2017 1444   LABSPEC 1.010 12/22/2017 1444   PHURINE 8.0 12/22/2017 1444   GLUCOSEU NEGATIVE 12/22/2017 1444   HGBUR SMALL (A) 12/22/2017 1444   BILIRUBINUR NEGATIVE 12/22/2017 1444   KETONESUR NEGATIVE 12/22/2017 1444   PROTEINUR NEGATIVE 12/22/2017 1444   UROBILINOGEN 0.2 03/13/2011 1123   NITRITE NEGATIVE 12/22/2017 1444   LEUKOCYTESUR NEGATIVE 12/22/2017 1444   Sepsis Labs Recent Labs  Lab 04/19/23 0711 04/20/23 0609  WBC 4.9  4.9 5.1   Microbiology No results found for this or any previous visit (from the past 240 hours).  Please note: You were cared for by a hospitalist during your hospital stay. Once you are discharged, your primary care physician will handle any further medical issues. Please note that NO REFILLS for any discharge medications will be authorized once you are discharged, as it is imperative that you return to your primary care physician (or establish a relationship with a primary care physician if you do not have one) for your post hospital discharge needs so that they can reassess your need for medications and monitor your lab values.    Time coordinating discharge: 40 minutes  SIGNED:   Burnadette Pop, MD  [facility] 04/20/2023, 1:44 PM Pager [phone]  If 7PM-7AM, please contact night-coverage [email or web address] Password [email or web address]

## 2023-04-20 NOTE — Evaluation (Signed)
 Physical Therapy Evaluation Patient Details Name: Sydney Booth MRN: [id number] DOB: 1931/07/05 Today's Date: 04/20/2023  History of Present Illness  Pt is an 89 y/o female who presented to ED after 30 min LOC sitting at kitchen table. EEG negative for seizures, MRI negative for acute abnormality. PMH: HTN, HLD, OSA on CPAP, GERD  Clinical Impression  Pt doing well with mobility and no further PT needed.  Ready for dc from PT standpoint. No dizziness or lightheadedness reported.   Orthostatic BPs  Supine 144/77  Sitting 156/73  Standing 158/72  Standing after 3 min 168/80            If plan is discharge home, recommend the following: Help with stairs or ramp for entrance   Can travel by private vehicle        Equipment Recommendations None recommended by PT  Recommendations for Other Services       Functional Status Assessment Patient has not had a recent decline in their functional status     Precautions / Restrictions Precautions Precautions: Fall;Other (comment) Precaution/Restrictions Comments: monitor orthostatics Restrictions Weight Bearing Restrictions Per Provider Order: No      Mobility  Bed Mobility Overal bed mobility: Modified Independent Bed Mobility: Supine to Sit, Sit to Supine     Supine to sit: HOB elevated, Modified independent (Device/Increase time) Sit to supine: Modified independent (Device/Increase time)        Transfers Overall transfer level: Modified independent Equipment used: Rolling walker (2 wheels) Transfers: Sit to/from Stand Sit to Stand: Modified independent (Device/Increase time)                Ambulation/Gait Ambulation/Gait assistance: Supervision Gait Distance (Feet): 150 Feet Assistive device: Rolling walker (2 wheels) Gait Pattern/deviations: Step-through pattern, Decreased stride length Gait velocity: decr Gait velocity interpretation: 1.31 - 2.62 ft/sec, indicative of limited community ambulator    General Gait Details: supervision for safety  Stairs Stairs: Yes Stairs assistance: Contact guard assist Stair Management: Two rails, Step to pattern, Forwards, Backwards Number of Stairs: 5 General stair comments: Used portable step and countertop and walker handle to simulate rails. Up forwards/Down backwards x 5  Wheelchair Mobility     Tilt Bed    Modified Rankin (Stroke Patients Only)       Balance Overall balance assessment: Needs assistance Sitting-balance support: No upper extremity supported, Feet supported Sitting balance-Leahy Scale: Good     Standing balance support: During functional activity, No upper extremity supported Standing balance-Leahy Scale: Fair                               Pertinent Vitals/Pain Pain Assessment Pain Assessment: No/denies pain    Home Living Family/patient expects to be discharged to:: Private residence Living Arrangements: Children;Other relatives (adult granddaughter, her fiance and adult grandson. 1 y/o great granddaughter) Available Help at Discharge: Family;Available 24 hours/day Type of Home: House Home Access: Stairs to enter Entrance Stairs-Rails: None Entrance Stairs-Number of Steps: 1 Alternate Level Stairs-Number of Steps: flight Home Layout: Two level;Bed/bath upstairs Home Equipment: Agricultural consultant (2 wheels);Shower seat (RW x 2 one upstairs and one downstairs)      Prior Function Prior Level of Function : Independent/Modified Independent             Mobility Comments: RW for mobility, has one upstairs and one downstairs that she uses. able to manage flight of stairs to/from bedroom w/ B rails ADLs Comments: Mod I for  ADLs, stands for showers. has assist for IADLs as needed     Extremity/Trunk Assessment   Upper Extremity Assessment Upper Extremity Assessment: Defer to OT evaluation    Lower Extremity Assessment Lower Extremity Assessment: Overall WFL for tasks assessed    Cervical  / Trunk Assessment Cervical / Trunk Assessment: Normal  Communication   Communication Communication: No apparent difficulties    Cognition Arousal: Alert Behavior During Therapy: WFL for tasks assessed/performed   PT - Cognitive impairments: No apparent impairments                         Following commands: Intact       Cueing Cueing Techniques: Verbal cues     General Comments General comments (skin integrity, edema, etc.): VSS. See assessment or flow sheet for orthostatics    Exercises Other Exercises Other Exercises: repeat sit to stand x 3 Other Exercises: Backward walking with counter top support and supervision   Assessment/Plan    PT Assessment Patient does not need any further PT services  PT Problem List         PT Treatment Interventions      PT Goals (Current goals can be found in the Care Plan section)  Acute Rehab PT Goals PT Goal Formulation: All assessment and education complete, DC therapy    Frequency       Co-evaluation               AM-PAC PT "6 Clicks" Mobility  Outcome Measure Help needed turning from your back to your side while in a flat bed without using bedrails?: None Help needed moving from lying on your back to sitting on the side of a flat bed without using bedrails?: None Help needed moving to and from a bed to a chair (including a wheelchair)?: None Help needed standing up from a chair using your arms (e.g., wheelchair or bedside chair)?: None Help needed to walk in hospital room?: A Little Help needed climbing 3-5 steps with a railing? : A Little 6 Click Score: 22    End of Session   Activity Tolerance: Patient tolerated treatment well Patient left: in bed;with call bell/phone within reach Nurse Communication: Mobility status PT Visit Diagnosis: Other abnormalities of gait and mobility (R26.89)    Time: 1050-1120 PT Time Calculation (min) (ACUTE ONLY): 30 min   Charges:   PT Evaluation $PT Eval Low  Complexity: 1 Low PT Treatments $Therapeutic Exercise: 8-22 mins PT General Charges $$ ACUTE PT VISIT: 1 Visit         [facility] PT Acute Rehabilitation Services Office [phone]   Pura Browns [facility] 04/20/2023, 11:36 AM

## 2023-04-20 NOTE — Evaluation (Signed)
 Occupational Therapy Evaluation Patient Details Name: Sydney Booth MRN: [id number] DOB: 08-Apr-1931 Today's Date: 04/20/2023   History of Present Illness   Pt is an 89 y/o female who presented to ED after 30 min LOC sitting at kitchen table. EEG negative for seizures, MRI negative for acute abnormality. PMH: HTN, HLD, OSA on CPAP, GERD     Clinical Impressions PTA, pt lives with family in a two level home, typically Modified Independent with ADLs and mobility using RW. Pt presents now feeling mildly weaker than baseline but able to manage ADLs/mobility in room using RW with no more than CGA. BP variable throughout but pt denies dizziness. Anticipate quick improvements with continued OOB activity and pt endorses consistent assist for daily tasks at home. No OT follow up needed at DC.   BP supine: 154/74, 72bpm BP sitting: 169/80, 80bpm BP standing: 149/70, 92bpm BP post activity: 164/80, 76 bpm     If plan is discharge home, recommend the following:   A little help with bathing/dressing/bathroom;Assistance with cooking/housework;Help with stairs or ramp for entrance     Functional Status Assessment   Patient has had a recent decline in their functional status and demonstrates the ability to make significant improvements in function in a reasonable and predictable amount of time.     Equipment Recommendations   None recommended by OT     Recommendations for Other Services         Precautions/Restrictions   Precautions Precautions: Fall;Other (comment) Precaution/Restrictions Comments: monitor orthostatics Restrictions Weight Bearing Restrictions Per Provider Order: No     Mobility Bed Mobility Overal bed mobility: Needs Assistance Bed Mobility: Supine to Sit     Supine to sit: Min assist, HOB elevated     General bed mobility comments: Min A to assist in scooting to EOB fully    Transfers Overall transfer level: Needs assistance Equipment used:  Rolling walker (2 wheels) Transfers: Sit to/from Stand, Bed to chair/wheelchair/BSC Sit to Stand: Supervision     Step pivot transfers: Contact guard assist     General transfer comment: RW used for [facility] transfer, able to stand from bedside and BSC without assist      Balance Overall balance assessment: Needs assistance Sitting-balance support: No upper extremity supported, Feet supported Sitting balance-Leahy Scale: Good     Standing balance support: Single extremity supported, Bilateral upper extremity supported, During functional activity Standing balance-Leahy Scale: Poor Standing balance comment: reliant on one UE support with LB ADLs in standing                           ADL either performed or assessed with clinical judgement   ADL Overall ADL's : Needs assistance/impaired Eating/Feeding: Independent   Grooming: Supervision/safety;Standing;Wash/dry hands   Upper Body Bathing: Set up;Sitting   Lower Body Bathing: Contact guard assist;Sit to/from stand;Sitting/lateral leans   Upper Body Dressing : Set up;Sitting   Lower Body Dressing: Contact guard assist;Sit to/from stand;Sitting/lateral leans;Minimal assistance Lower Body Dressing Details (indicate cue type and reason): did require Min A to don socks due to diffiuclty reaching from EOB (potentially due to short stature) Toilet Transfer: Contact guard assist;Stand-pivot;BSC/3in1;Rolling walker (2 wheels) Toilet Transfer Details (indicate cue type and reason): pt opted to use [facility] for urination rather than mobilizing to bathroom Toileting- Clothing Manipulation and Hygiene: Contact guard assist;Sitting/lateral lean;Sit to/from stand Toileting - Clothing Manipulation Details (indicate cue type and reason): Able to alternate one UE support in standing for hygiene  Functional mobility during ADLs: Contact guard assist;Rolling walker (2 wheels) General ADL Comments: Dicussed ADL modifications, use of shower  chair initially if needed, family assist available 24/7 for any tasks     Vision Baseline Vision/History: 1 Wears glasses Ability to See in Adequate Light: 0 Adequate Patient Visual Report: No change from baseline Vision Assessment?: No apparent visual deficits     Perception         Praxis         Pertinent Vitals/Pain Pain Assessment Pain Assessment: No/denies pain     Extremity/Trunk Assessment Upper Extremity Assessment Upper Extremity Assessment: Overall WFL for tasks assessed;Right hand dominant   Lower Extremity Assessment Lower Extremity Assessment: Defer to PT evaluation   Cervical / Trunk Assessment Cervical / Trunk Assessment: Normal   Communication Communication Communication: No apparent difficulties   Cognition Arousal: Alert Behavior During Therapy: WFL for tasks assessed/performed Cognition: No apparent impairments                               Following commands: Intact       Cueing  General Comments   Cueing Techniques: Verbal cues;Gestural cues      Exercises     Shoulder Instructions      Home Living Family/patient expects to be discharged to:: Private residence Living Arrangements: Children;Other relatives (adult granddaughter, her fiance and adult grandson. 1 y/o great granddaughter) Available Help at Discharge: Family;Available 24 hours/day Type of Home: House       Home Layout: Two level;Bed/bath upstairs Alternate Level Stairs-Number of Steps: flight Alternate Level Stairs-Rails: Left;Right Bathroom Shower/Tub: Producer, television/film/video: Standard     Home Equipment: Agricultural consultant (2 wheels);Shower seat (RW x 2 one upstairs and one downstairs)          Prior Functioning/Environment Prior Level of Function : Independent/Modified Independent             Mobility Comments: RW for mobility, has one upstairs and one downstairs that she uses. able to manage flight of stairs to/from bedroom w/ B  rails ADLs Comments: Mod I for ADLs, stands for showers. has assist for IADLs as needed    OT Problem List: Decreased activity tolerance;Impaired balance (sitting and/or standing);Decreased strength   OT Treatment/Interventions: Self-care/ADL training;Therapeutic exercise;Energy conservation;DME and/or AE instruction;Therapeutic activities;Patient/family education;Balance training      OT Goals(Current goals can be found in the care plan section)   Acute Rehab OT Goals Patient Stated Goal: go home soon, get packing removed from nose OT Goal Formulation: With patient Time For Goal Achievement: 05/04/23 Potential to Achieve Goals: Good ADL Goals Pt Will Perform Lower Body Dressing: with modified independence;sit to/from stand;sitting/lateral leans Pt Will Transfer to Toilet: with modified independence;ambulating Additional ADL Goal #1: Pt to increase standing activity tolerance > 10 min during ADLs/mobility without seated rest break   OT Frequency:  Min 2X/week    Co-evaluation              AM-PAC OT "6 Clicks" Daily Activity     Outcome Measure Help from another person eating meals?: None Help from another person taking care of personal grooming?: A Little Help from another person toileting, which includes using toliet, bedpan, or urinal?: A Little Help from another person bathing (including washing, rinsing, drying)?: A Little Help from another person to put on and taking off regular upper body clothing?: A Little Help from another person to put on  and taking off regular lower body clothing?: A Little 6 Click Score: 19   End of Session Equipment Utilized During Treatment: Rolling walker (2 wheels) Nurse Communication: Mobility status;Other (comment) (BP)  Activity Tolerance: Patient tolerated treatment well Patient left: in chair;with call bell/phone within reach  OT Visit Diagnosis: Muscle weakness (generalized) (M62.81);Unsteadiness on feet (R26.81)                 Time: 1610-9604 OT Time Calculation (min): 33 min Charges:  OT General Charges $OT Visit: 1 Visit OT Evaluation $OT Eval Low Complexity: 1 Low OT Treatments $Self Care/Home Management : 8-22 mins  Lawrence Pretty, OTR/L Acute Rehab Services Office: [phone]   Annabella Barr 04/20/2023, 8:26 AM

## 2023-04-20 NOTE — Care Management Obs Status (Signed)
 MEDICARE OBSERVATION STATUS NOTIFICATION   Patient Details  Name: Sydney Booth MRN: [id number] Date of Birth: 12-11-1931   Medicare Observation Status Notification Given:  Yes    Jonathan Neighbor, RN 04/20/2023, 11:25 AM

## 2023-04-20 NOTE — TOC Transition Note (Signed)
 Transition of Care [facility]) - Discharge Note   Patient Details  Name: Kami Kube MRN: [id number] Date of Birth: Jul 27, 1931  [facility] CM/SW Contact:  Jonathan Neighbor, RN Phone Number: 04/20/2023, 11:43 AM   Clinical Narrative:     Pt is discharging home with self care. No follow up per therapies.  She has needed DME at home. Family provides needed transportation. She manages her own medications without any issues.  Final next level of care: Home/Self Care Barriers to Discharge: No Barriers Identified   Patient Goals and CMS Choice            Discharge Placement                       Discharge Plan and Services Additional resources added to the After Visit Summary for                                       Social Drivers of Health (SDOH) Interventions SDOH Screenings   Food Insecurity: No Food Insecurity (04/19/2023)  Housing: Unknown (04/19/2023)  Transportation Needs: No Transportation Needs (04/19/2023)  Utilities: Not At Risk (04/19/2023)  Alcohol Screen: Low Risk  (09/18/2020)  Depression (PHQ2-9): Low Risk  (02/14/2021)  Financial Resource Strain: Low Risk  (09/18/2020)  Physical Activity: Inactive (09/18/2020)  Social Connections: Moderately Isolated (04/19/2023)  Stress: No Stress Concern Present (09/18/2020)  Tobacco Use: Low Risk  (04/19/2023)     Readmission Risk Interventions     No data to display

## 2023-04-20 NOTE — Plan of Care (Signed)

## 2023-04-21 ENCOUNTER — Ambulatory Visit

## 2023-04-22 ENCOUNTER — Ambulatory Visit (INDEPENDENT_AMBULATORY_CARE_PROVIDER_SITE_OTHER): Admitting: Physician Assistant

## 2023-04-22 ENCOUNTER — Encounter (INDEPENDENT_AMBULATORY_CARE_PROVIDER_SITE_OTHER): Payer: Self-pay

## 2023-04-22 VITALS — BP 130/99 | HR 94

## 2023-04-22 DIAGNOSIS — R040 Epistaxis: Secondary | ICD-10-CM | POA: Diagnosis not present

## 2023-04-22 DIAGNOSIS — H9193 Unspecified hearing loss, bilateral: Secondary | ICD-10-CM

## 2023-04-22 DIAGNOSIS — H9192 Unspecified hearing loss, left ear: Secondary | ICD-10-CM

## 2023-04-22 NOTE — Progress Notes (Signed)
 Dear Dr. Joice Nares, Here is my assessment for our mutual patient, Sydney Booth. Thank you for allowing me the opportunity to care for your patient. Please do not hesitate to contact me should you have any other questions. Sincerely, Belma Boxer PA-C  Otolaryngology Clinic Note Referring provider: Dr. Joice Nares HPI:  Sydney Booth is a 89 y.o. female kindly referred by Dr. Joice Nares   The patient is a 42 YOM seen today for ER follow up sp right nosebleed. The patient notes that 4 days ago she sneezed and had immediate bleeding from her right nose. She had mostly anterior bleeding, minimal posterior. This was uncontrolled at home so she went to the ER. She reports using 81 mg ASA daily not other anticoagulants or antiplatelets. She has no bleeding history and does not recall and bleeding from her nose since she was child. She denies any trauma to the nose including digital trauma. She does report a history of seasonal allergies but reports it has been well controlled.  No nasal surgical history. She does report using a CPAP, no humidification.   In addition she noted pressure in her left ear with some decrease hearing. No hearing loss on the right, no trauma, dizziness, ringing. No history of recurrent ear infections. This has been going on for several months.   Once in the ER the bleeding was still uncontrolled. They placed a rhino rocket which stopped the bleeding. Of note she was hypertensive in the ER at 185/87.  Independent Review of Additional Tests or Records:  ER visit note from 04/18/2023   PMH/Meds/All/SocHx/FamHx/ROS:   Past Medical History:  Diagnosis Date   Arthritis    Blood transfusion    Cervical vertebral fracture (HCC)    fued on its on   Closed head injury    as a chlid   Coronary artery calcification seen on CT scan    Fall 12/22/2017   right eye bruised   Gastric ulcer    GERD (gastroesophageal reflux disease)    H/O hiatal hernia    Headache(784.0)    Hyperlipemia     Hypertension    Left carpal tunnel syndrome 06/08/2018   Mild pulmonary hypertension (HCC)    Mitral regurgitation    Moderate by 2D echo 08/2022   Mitral stenosis    Mild by 2D echo 08/2022 with mean mitral valve gradient 4 mmHg   Mitral valve prolapse    Obesity    OSA on CPAP    Osteopenia    Rotator cuff tear    Left   Seasonal allergies    Shortness of breath    With activity   Ulcer 1991   [place]      Past Surgical History:  Procedure Laterality Date   BIospy      Biospy      Uterus wall   Carparal Tunnel     CATARACT EXTRACTION     Colonscopy     EYE SURGERY     Right 2011, Left 2008   LIPOMA EXCISION     FROM BACK    THUMB FUSION     TOTAL KNEE ARTHROPLASTY  03/24/2011   Procedure: TOTAL KNEE ARTHROPLASTY;  Surgeon: Alphonzo Ask, MD;  Location: [facility] OR;  Service: Orthopedics;  Laterality: Left;  with revision tibial component   TUBAL LIGATION      Family History  Problem Relation Age of Onset   Stroke Mother    Cancer Father        COLON, LIVER  AND PANCREAS   Heart disease Brother    Sleep apnea Brother    Sleep apnea Brother    Pulmonary embolism Brother    ALS Son    Tourette syndrome Son    Healthy Daughter    Autism Grandson    Anesthesia problems Neg Hx    Breast cancer Neg Hx      Social Connections: Moderately Isolated (04/19/2023)   Social Connection and Isolation Panel [NHANES]    Frequency of Communication with Friends and Family: More than three times a week    Frequency of Social Gatherings with Friends and Family: More than three times a week    Attends Religious Services: More than 4 times per year    Active Member of [employer] or Organizations: No    Attends Banker Meetings: Never    Marital Status: Widowed      Current Outpatient Medications:    acetaminophen  (TYLENOL ) 325 MG tablet, Take 2 tablets (650 mg total) by mouth every 6 (six) hours as needed for moderate pain., Disp: 30 tablet, Rfl: 0   Ascorbic Acid  (VITAMIN C PO), Take 1,000 mg by mouth in the morning and at bedtime., Disp: , Rfl:    aspirin  81 MG tablet, Take 81 mg by mouth daily., Disp: , Rfl:    calcium carbonate (OS-CAL) 600 MG TABS, Take 600 mg by mouth daily., Disp: , Rfl:    CINNAMON PO, Take 500 mg by mouth 2 (two) times daily., Disp: , Rfl:    ezetimibe  (ZETIA ) 10 MG tablet, TAKE 1 TABLET BY MOUTH DAILY, Disp: 90 tablet, Rfl: 1   metoprolol  tartrate (LOPRESSOR ) 25 MG tablet, TAKE 1 TABLET BY MOUTH TWICE  DAILY, Disp: 180 tablet, Rfl: 1   Omega-3 Fatty Acids (FISH OIL) 1000 MG CPDR, Take 1,000 mg by mouth in the morning, at noon, and at bedtime., Disp: , Rfl:    omeprazole  (PRILOSEC) 40 MG capsule, TAKE 1 CAPSULE BY MOUTH DAILY, Disp: 90 capsule, Rfl: 0   QUERCETIN PO, Take 2 capsules by mouth 3 (three) times daily as needed (Allergies)., Disp: , Rfl:    vitamin B-12 (CYANOCOBALAMIN) 1000 MCG tablet, Take 1,000 mcg by mouth daily., Disp: , Rfl:    VITAMIN D , CHOLECALCIFEROL, PO, Take 1 tablet by mouth daily., Disp: , Rfl:    Zinc  Acetate, Oral, (ZINC  ACETATE PO), Take 1 tablet by mouth., Disp: , Rfl:    Physical Exam:   There were no vitals taken for this visit.  Pertinent Findings  CN II-XII intact Bilateral EAC clear and TM intact with well pneumatized middle ear spaces Anterior rhinoscopy: Septum midline, some area of irritation with minimal blood along the right inferior turbinate, no active bleeding No lesions of oral cavity/oropharynx; no blood noted in posterior oral pharynx  No obviously palpable neck masses/lymphadenopathy/thyromegaly No respiratory distress or stridor  Seprately Identifiable Procedures:  None  Impression & Plans:  [name] is a 89 y.o. female with the following   Epistaxis-  11 YOF here with epistaxis after sneezing. She does have an are of irritation along her right inferior turbinate. This could be the result of numerous factors including elevated blood pressure which was significant at the  time of her ER visit; it could have been elevated due to the event. She also has a history of seasonal allergies which could have contributed to increased irration. Lastly she does where a CPAP machine with no humidification. I would recommend she avoid blowing her nose for  the next several weeks. I would avoid using the CPAP for another week if possible and also talk to the prescribing provider about humidification. She will use Vaseline along the nare with nasal saline spray. Return as needed.    Left sided hearing loss-  Uncertain etiology at this time, she will need audiological evaluation. I would like to see her back in the office with evaluation. Not acute.    - f/u follow-up after audiological evaluation   Thank you for allowing me the opportunity to care for your patient. Please do not hesitate to contact me should you have any other questions.  Sincerely, Belma Boxer PA-C [facility] Phone: [phone] Fax: [phone]  04/22/2023, 1:23 PM

## 2023-06-29 ENCOUNTER — Encounter: Payer: Self-pay | Admitting: Cardiology

## 2023-06-29 ENCOUNTER — Ambulatory Visit: Attending: Cardiology | Admitting: Cardiology

## 2023-06-29 ENCOUNTER — Ambulatory Visit

## 2023-06-29 VITALS — BP 138/82 | HR 75 | Ht 60.0 in | Wt 212.4 lb

## 2023-06-29 DIAGNOSIS — I5032 Chronic diastolic (congestive) heart failure: Secondary | ICD-10-CM | POA: Diagnosis not present

## 2023-06-29 DIAGNOSIS — I059 Rheumatic mitral valve disease, unspecified: Secondary | ICD-10-CM | POA: Insufficient documentation

## 2023-06-29 DIAGNOSIS — I2510 Atherosclerotic heart disease of native coronary artery without angina pectoris: Secondary | ICD-10-CM | POA: Insufficient documentation

## 2023-06-29 DIAGNOSIS — G4733 Obstructive sleep apnea (adult) (pediatric): Secondary | ICD-10-CM | POA: Diagnosis not present

## 2023-06-29 DIAGNOSIS — R55 Syncope and collapse: Secondary | ICD-10-CM | POA: Insufficient documentation

## 2023-06-29 DIAGNOSIS — I272 Pulmonary hypertension, unspecified: Secondary | ICD-10-CM | POA: Insufficient documentation

## 2023-06-29 DIAGNOSIS — I350 Nonrheumatic aortic (valve) stenosis: Secondary | ICD-10-CM | POA: Insufficient documentation

## 2023-06-29 DIAGNOSIS — E785 Hyperlipidemia, unspecified: Secondary | ICD-10-CM | POA: Insufficient documentation

## 2023-06-29 DIAGNOSIS — I10 Essential (primary) hypertension: Secondary | ICD-10-CM | POA: Insufficient documentation

## 2023-06-29 NOTE — Progress Notes (Unsigned)
 Applied a 14 day Zio XT monitor to patient in the office ?

## 2023-06-29 NOTE — Patient Instructions (Addendum)
 Medication Instructions:  Your physician recommends that you continue on your current medications as directed. Please refer to the Current Medication list given to you today.  *If you need a refill on your cardiac medications before your next appointment, please call your pharmacy*  Lab Work: NONE If you have labs (blood work) drawn today and your tests are completely normal, you will receive your results only by: MyChart Message (if you have MyChart) OR A paper copy in the mail If you have any lab test that is abnormal or we need to change your treatment, we will call you to review the results.  Testing/Procedures: Echocardiogram April 2026 Your physician has requested that you have an echocardiogram. Echocardiography is a painless test that uses sound waves to create images of your heart. It provides your doctor with information about the size and shape of your heart and how well your heart's chambers and valves are working. This procedure takes approximately one hour. There are no restrictions for this procedure. Please do NOT wear cologne, perfume, aftershave, or lotions (deodorant is allowed). Please arrive 15 minutes prior to your appointment time.  Please note: We ask at that you not bring children with you during ultrasound (echo/ vascular) testing. Due to room size and safety concerns, children are not allowed in the ultrasound rooms during exams. Our front office staff cannot provide observation of children in our lobby area while testing is being conducted. An adult accompanying a patient to their appointment will only be allowed in the ultrasound room at the discretion of the ultrasound technician under special circumstances. We apologize for any inconvenience.   14 Day Zio Heart Monitor Your physician has requested that you wear a Zio heart monitor for 14 days. This will be mailed to your home with instructions on how to apply the monitor and how to return it when finished. Please  allow 2 weeks after returning the heart monitor before our office calls you with the results.   Follow-Up: At [facility], you and your health needs are our priority.  As part of our continuing mission to provide you with exceptional heart care, our providers are all part of one team.  This team includes your primary Cardiologist (physician) and Advanced Practice Providers or APPs (Physician Assistants and Nurse Practitioners) who all work together to provide you with the care you need, when you need it.  Your next appointment:   1 year  Provider:   Shlomo, MD  We recommend signing up for the patient portal called MyChart.  Sign up information is provided on this After Visit Summary.  MyChart is used to connect with patients for Virtual Visits (Telemedicine).  Patients are able to view lab/test results, encounter notes, upcoming appointments, etc.  Non-urgent messages can be sent to your provider as well.   To learn more about what you can do with MyChart, go to [email or web address].   Other Instructions ZIO XT- Long Term Monitor Instructions  Your physician has requested you wear a ZIO patch monitor for 14 days.  This is a single patch monitor. [employer] supplies one patch monitor per enrollment. Additional stickers are not available. Please do not apply patch if you will be having a Nuclear Stress Test,  Echocardiogram, Cardiac CT, MRI, or Chest Xray during the period you would be wearing the  monitor. The patch cannot be worn during these tests. You cannot remove and re-apply the  ZIO XT patch monitor.  Your ZIO patch monitor will be mailed 3  day USPS to your address on file. It may take 3-5 days  to receive your monitor after you have been enrolled.  Once you have received your monitor, please review the enclosed instructions. Your monitor  has already been registered assigning a specific monitor serial # to you.  Billing and Patient Assistance Program Information  We  have supplied [employer] with any of your insurance information on file for billing purposes. [employer] offers a sliding scale Patient Assistance Program for patients that do not have  insurance, or whose insurance does not completely cover the cost of the ZIO monitor.  You must apply for the Patient Assistance Program to qualify for this discounted rate.  To apply, please call [employer] at [phone], select option 4, select option 2, ask to apply for  Patient Assistance Program. Meredeth will ask your household income, and how many people  are in your household. They will quote your out-of-pocket cost based on that information.  [employer] will also be able to set up a 73-month, interest-free payment plan if needed.  Applying the monitor   Shave hair from upper left chest.  Hold abrader disc by orange tab. Rub abrader in 40 strokes over the upper left chest as  indicated in your monitor instructions.  Clean area with 4 enclosed alcohol pads. Let dry.  Apply patch as indicated in monitor instructions. Patch will be placed under collarbone on left  side of chest with arrow pointing upward.  Rub patch adhesive wings for 2 minutes. Remove white label marked 1. Remove the white  label marked 2. Rub patch adhesive wings for 2 additional minutes.  While looking in a mirror, press and release button in center of patch. A small green light will  flash 3-4 times. This will be your only indicator that the monitor has been turned on.  Do not shower for the first 24 hours. You may shower after the first 24 hours.  Press the button if you feel a symptom. You will hear a small click. Record Date, Time and  Symptom in the Patient Logbook.  When you are ready to remove the patch, follow instructions on the last 2 pages of Patient  Logbook. Stick patch monitor onto the last page of Patient Logbook.  Place Patient Logbook in the blue and white box. Use locking tab on box and tape box closed  securely. The blue  and white box has prepaid postage on it. Please place it in the mailbox as  soon as possible. Your physician should have your test results approximately 7 days after the  monitor has been mailed back to [facility].  Call [facility] Customer Care at [phone] if you have questions regarding  your ZIO XT patch monitor. Call them immediately if you see an orange light blinking on your  monitor.  If your monitor falls off in less than 4 days, contact our Monitor department at [phone].  If your monitor becomes loose or falls off after 4 days call [employer] at [phone] for  suggestions on securing your monitor

## 2023-06-29 NOTE — Progress Notes (Signed)
 Cardiology/Sleep Note    Date:  06/29/2023   ID:  Sydney Booth, DOB 03-Aug-1931, MRN [id number]  PCP:  Rexanne Ingle, MD  Cardiologist: Wilbert Bihari, MD  Chief Complaint  Patient presents with   Coronary Artery Disease   Hypertension   Hyperlipidemia   Sleep Apnea   Congestive Heart Failure   Aortic Stenosis   Mitral Regurgitation    History of Present Illness:  Sydney Booth is a 89 y.o. female  with a history of coronary artery calcifications, hyperlipidemia, chronic diastolic CHF hypertension, pulmonary hypertension, mitral regurgitation/stenosis and a history of obstructive sleep apnea on CPAP in the past.   She was in the hospital 04/2023 with a nosebleed requiring nasal packing.  The next morning she was sitting at the table and passed out and EMS was called and she was admitted. LOC for about 30 minutes with associated urinary incontinence. Her only prodromal symptom was feeling generally weak prior to episode. No post-ictal state noted per reports. Per daughter, no reported seizure history.  She has not had any further syncope.   SHe is here today for followup and is doing well.  She has a pain under her left axilla from time to time but is nonexertional with no radiation or associated sx and only lasts a second. She has chronic SOB, LE edema and dizziness that remain unchanged from last OV. She denies any anginal chest pain or pressure,  PND, orthopnea, LE edema, palpitations or syncope. She is compliant with her meds and is tolerating meds with no SE.    She is doing well with her PAP device.  She tolerates the mask and feels the pressure is adequate.  Since going on PAP she feels rested in the am but gets sleepy in the afternoon.  She does not sleep well at night and wakes up frequently to go to the bathroom.  She also wakes up for no reason in the middle of the night for no reason.   She denies any significant mouth or nasal dryness or nasal congestion.  She does not  think that he snores.   .   Past Medical History:  Diagnosis Date   Arthritis    Blood transfusion    Cervical vertebral fracture (HCC)    fued on its on   Closed head injury    as a chlid   Coronary artery calcification seen on CT scan    Fall 12/22/2017   right eye bruised   Gastric ulcer    GERD (gastroesophageal reflux disease)    H/O hiatal hernia    Headache(784.0)    Hyperlipemia    Hypertension    Left carpal tunnel syndrome 06/08/2018   Mild pulmonary hypertension (HCC)    Mitral regurgitation    Moderate by 2D echo 08/2022   Mitral stenosis    Mild by 2D echo 08/2022 with mean mitral valve gradient 4 mmHg   Mitral valve prolapse    Obesity    OSA on CPAP    Osteopenia    Rotator cuff tear    Left   Seasonal allergies    Shortness of breath    With activity   Ulcer 1991   [place]     Past Surgical History:  Procedure Laterality Date   BIospy      Biospy      Uterus wall   Carparal Tunnel     CATARACT EXTRACTION     Colonscopy  EYE SURGERY     Right 2011, Left 2008   LIPOMA EXCISION     FROM BACK    THUMB FUSION     TOTAL KNEE ARTHROPLASTY  03/24/2011   Procedure: TOTAL KNEE ARTHROPLASTY;  Surgeon: Maude KANDICE Herald, MD;  Location: [facility] OR;  Service: Orthopedics;  Laterality: Left;  with revision tibial component   TUBAL LIGATION      Current Medications: Current Meds  Medication Sig   acetaminophen  (TYLENOL ) 325 MG tablet Take 2 tablets (650 mg total) by mouth every 6 (six) hours as needed for moderate pain.   Ascorbic Acid (VITAMIN C PO) Take 1,000 mg by mouth in the morning and at bedtime.   aspirin  81 MG tablet Take 81 mg by mouth daily.   calcium carbonate (OS-CAL) 600 MG TABS Take 600 mg by mouth daily.   CINNAMON PO Take 500 mg by mouth 2 (two) times daily.   ezetimibe  (ZETIA ) 10 MG tablet TAKE 1 TABLET BY MOUTH DAILY   metoprolol  tartrate (LOPRESSOR ) 25 MG tablet TAKE 1 TABLET BY MOUTH TWICE  DAILY   Omega-3 Fatty Acids (FISH OIL) 1000  MG CPDR Take 1,000 mg by mouth in the morning, at noon, and at bedtime.   omeprazole  (PRILOSEC) 40 MG capsule TAKE 1 CAPSULE BY MOUTH DAILY   QUERCETIN PO Take 2 capsules by mouth 3 (three) times daily as needed (Allergies).   vitamin B-12 (CYANOCOBALAMIN) 1000 MCG tablet Take 1,000 mcg by mouth daily.   VITAMIN D , CHOLECALCIFEROL, PO Take 1 tablet by mouth daily.   Zinc  Acetate, Oral, (ZINC  ACETATE PO) Take 1 tablet by mouth.    Allergies:   Cymbalta  [duloxetine  hcl], Farxiga  [dapagliflozin ], Gabapentin , Statins, and Sulfa antibiotics   Social History   Socioeconomic History   Marital status: Widowed    Spouse name: Not on file   Number of children: 5   Years of education: 26   Highest education level: Not on file  Occupational History   Occupation: Retired   Tobacco Use   Smoking status: Never    Passive exposure: Never   Smokeless tobacco: Never  Vaping Use   Vaping status: Never Used  Substance and Sexual Activity   Alcohol use: No    Alcohol/week: 0.0 standard drinks of alcohol   Drug use: No   Sexual activity: Not Currently  Other Topics Concern   Not on file  Social History Narrative   Patient lives at home with daughter   Patient is retired   Patient is widowed   Patient has a high school education   Patient has 5 children       Right Handed    Lives in a two story home   Social Drivers of Health   Financial Resource Strain: Low Risk  (09/18/2020)   Overall Financial Resource Strain (CARDIA)    Difficulty of Paying Living Expenses: Not hard at all  Food Insecurity: No Food Insecurity (04/19/2023)   Hunger Vital Sign    Worried About Running Out of Food in the Last Year: Never true    Ran Out of Food in the Last Year: Never true  Transportation Needs: No Transportation Needs (04/19/2023)   PRAPARE - Administrator, Civil Service (Medical): No    Lack of Transportation (Non-Medical): No  Physical Activity: Inactive (09/18/2020)   Exercise Vital  Sign    Days of Exercise per Week: 0 days    Minutes of Exercise per Session: 0 min  Stress: No  Stress Concern Present (09/18/2020)   [employer] of Occupational Health - Occupational Stress Questionnaire    Feeling of Stress : Not at all  Social Connections: Moderately Isolated (04/19/2023)   Social Connection and Isolation Panel    Frequency of Communication with Friends and Family: More than three times a week    Frequency of Social Gatherings with Friends and Family: More than three times a week    Attends Religious Services: More than 4 times per year    Active Member of [employer] or Organizations: No    Attends Banker Meetings: Never    Marital Status: Widowed     Family History:  The patient's family history includes ALS in her son; Autism in her grandson; Cancer in her father; Healthy in her daughter; Heart disease in her brother; Pulmonary embolism in her brother; Sleep apnea in her brother and brother; Stroke in her mother; Tourette syndrome in her son.   ROS:   Please see the history of present illness.    ROS All other systems reviewed and are negative.     02/01/2018    9:41 AM  PAD Screen  Previous PAD dx? No  Previous surgical procedure? No  Pain with walking? No  Feet/toe relief with dangling? No  Painful, non-healing ulcers? No  Extremities discolored? No       PHYSICAL EXAM:   VS:  BP 138/82   Pulse 75   Ht 5' (1.524 m)   Wt 212 lb 6.4 oz (96.3 kg)   SpO2 99%   BMI 41.48 kg/m    GEN: Well nourished, well developed in no acute distress HEENT: Normal NECK: No JVD; No carotid bruits LYMPHATICS: No lymphadenopathy CARDIAC:RRR, no murmurs, rubs, gallops RESPIRATORY:  Clear to auscultation without rales, wheezing or rhonchi  ABDOMEN: Soft, non-tender, non-distended MUSCULOSKELETAL:  No edema; No deformity  SKIN: Warm and dry NEUROLOGIC:  Alert and oriented x 3 PSYCHIATRIC:  Normal affect  Wt Readings from Last 3 Encounters:  06/29/23  212 lb 6.4 oz (96.3 kg)  04/20/23 205 lb 14.6 oz (93.4 kg)  04/18/23 200 lb (90.7 kg)      Studies/Labs Reviewed:   PAP compliance download  Recent Labs: 04/19/2023: ALT 12 04/20/2023: BUN 12; Creatinine, Ser 0.79; Hemoglobin 12.1; Platelets 302; Potassium 4.2; Sodium 134    Additional studies/ records that were reviewed today include:  Office notes by Dr. Claudene    ASSESSMENT:    1. OSA (obstructive sleep apnea)   2. Essential hypertension   3. Chronic diastolic heart failure (HCC)   4. Coronary artery calcification seen on CT scan   5. Hyperlipidemia, unspecified hyperlipidemia type   6. Mitral valve disease   7. Nonrheumatic aortic valve stenosis   8. Pulmonary hypertension, unspecified (HCC)       PLAN:  In order of problems listed above:  OSA - The patient is tolerating PAP therapy well without any problems. The PAP download performed by his DME was personally reviewed and interpreted by me today and showed an AHI of 1.2 /hr on auto CPAP 4-20 cm H2O with 100% compliance in using more than 4 hours nightly.  The patient has been using and benefiting from PAP use and will continue to benefit from therapy.  -she does not really like the new machine -her 95th% pressure is 11.4cm H2O.  I am going to try her on a set pressure of 13cm H2O and get a download in 4 weeks. -she was instructed to call  if pressure is not tolerated  Hypertension - BP is controlled - Continue Lopressor  25 mg twice daily with as needed refills  Chronic diastolic CHF -Appears euvolemic on exam today -Has not required diuretic therapy -Continue Lopressor  25 mg twice daily  Coronary artery calcifications -no ischemia on stress test 2022 -No anginal symptoms -Continue aspirin  81 mg daily, Lopressor  25 mg twice daily and Zetia  10 mg daily -Statin intolerant  Hyperlipidemia -LDL goal less than 70 -Continue Zetia  10 mg daily -Statin intolerant -I have personally reviewed and interpreted outside  labs performed by patient's PCP which showed LDL 95 and HDL 55 on 01/14/2023 -given her advanced age will not proceed with PCSK9i  Mitral regurgitation Mitral stenosis -2D echo 04/2023 EF 60 to 65% with G2 DD, normal RV, mild MR and mild mitral stenosis with mean mitral valve gradient 4 mmHg - Repeat 2D echo in 1 year  Aortic stenosis - Mild by echo 04/2023 with mean aortic valve gradient 13 mmHg - Repeat echo in 1 year  Pulmonary hypertension - Echo 04/25/2023 with normal PAP  Syncope -suspect orthostatic -she had significant nosebleed and then a day later passed out -I am concerned that the episode occurred while sitting -no seizure on EEG -2D echo with normal LVF, G2DD, mild MS and AS -will get a 2 week ziopatch to rule out arrhythmias -I told her to let me know if she has any more syncope   Followup with me in 1 year   Medication Adjustments/Labs and Tests Ordered: Current medicines are reviewed at length with the patient today.  Concerns regarding medicines are outlined above.  Medication changes, Labs and Tests ordered today are listed in the Patient Instructions below.  There are no Patient Instructions on file for this visit.   Signed, Wilbert Bihari, MD  06/29/2023 8:29 AM    [facility] [address] [place], [place], [place]  [address] Phone: [phone]; Fax: [phone]

## 2023-06-29 NOTE — Addendum Note (Signed)
 Addended by: Layna Roeper N on: 06/29/2023 08:51 AM   Modules accepted: Orders

## 2023-06-30 NOTE — Addendum Note (Signed)
 Addended by: BEVELY CONNELL BROCKS on: 06/30/2023 02:51 PM   Modules accepted: Orders

## 2023-06-30 NOTE — Progress Notes (Signed)
 Order for pressure change of auto CPAP to a set pressure of 13cm H2O and get a download in 4 weeks sent to [facility] today.

## 2023-07-27 ENCOUNTER — Encounter: Payer: Self-pay | Admitting: Cardiology

## 2023-07-27 ENCOUNTER — Ambulatory Visit: Payer: Self-pay | Admitting: Cardiology

## 2023-07-27 DIAGNOSIS — R55 Syncope and collapse: Secondary | ICD-10-CM

## 2023-07-27 DIAGNOSIS — I4719 Other supraventricular tachycardia: Secondary | ICD-10-CM | POA: Insufficient documentation

## 2023-07-29 NOTE — Telephone Encounter (Signed)
-----   Message from Wilbert Bihari sent at 07/27/2023  4:55 PM EDT ----- Heart monitor showed multiple episodes of a fast heartbeat coming from the top of the heart called SVT (paroxysmal atrial tachycardia).  This is caused by piece of tissue in the upper chambers of the  heart that gets irritable and causes fast heartbeats.  These typically do not cause syncope.  I do not think her syncopal episode she had was related to this since she had passed out a day after  having a nosebleed.  If she has any further episodes of syncope please let me know.  Please find out if she has had any palpitations ----- Message ----- From: Bihari Wilbert SAUNDERS, MD Sent: 07/27/2023   4:53 PM EDT To: Wilbert SAUNDERS Bihari, MD

## 2023-07-29 NOTE — Telephone Encounter (Signed)
 Call to patient to discuss Heart monitor results, which showed multiple episodes of a fast heartbeat coming from the top of the heart called SVT (paroxysmal atrial tachycardia). Patient advises she has had no further episodes of syncope, she denies palpitations. Patient responses forwarded to Dr. Shlomo.

## 2023-08-11 ENCOUNTER — Telehealth: Payer: Self-pay

## 2023-08-11 NOTE — Telephone Encounter (Signed)
 Call to patient to discuss recommendations from Dr. Shlomo (see other encounter), patient reports her BP is high today and requests advice. Patient states normally her BP is low, but today her BP is 189/105, HR 74. She denies chest pain, blurry vision, weakness, headache or SOB. She was seen 06/29/23 and BP was 138/82. She states elevated BP just started today. She took her first dose of lopressor  at breakfast and then took a second dose just now when her BP was 189/101. She does report she has some aches and pains today, advised her to take PRN tylenol  per med list, then recheck BP in 2 hours. Advised patient to call our office  tomorrow to report if BP remains elevated.

## 2023-08-11 NOTE — Telephone Encounter (Signed)
-----   Message from Wilbert Bihari sent at 07/29/2023  3:08 PM EDT ----- She needs to let us  know if she has any further syncope ----- Message ----- From: Janit Geni CROME, RN Sent: 07/29/2023   3:02 PM EDT To: Wilbert JONELLE Bihari, MD  ----- Message from Geni CROME Janit, RN sent at 07/29/2023  3:02 PM EDT -----   ----- Message ----- From: Bihari Wilbert JONELLE, MD Sent: 07/27/2023   4:55 PM EDT To: Geni CROME Janit, RN  Heart monitor showed multiple episodes of a fast heartbeat coming from the top of the heart called SVT (paroxysmal atrial tachycardia).  This is caused by piece of tissue in the upper chambers of the  heart that gets irritable and causes fast heartbeats.  These typically do not cause syncope.  I do not think her syncopal episode she had was related to this since she had passed out a day after  having a nosebleed.  If she has any further episodes of syncope please let me know.  Please find out if she has had any palpitations ----- Message ----- From: Bihari Wilbert JONELLE, MD Sent: 07/27/2023   4:53 PM EDT To: Wilbert JONELLE Bihari, MD

## 2023-08-11 NOTE — Telephone Encounter (Signed)
 Call to patient to advise Dr. Shlomo would like her to call us  if she has any further syncope, patient verbalizes understanding.

## 2023-08-13 NOTE — Telephone Encounter (Signed)
 Call to patient to check on blood pressures. Patient says her BP is back to normal at 135/70 and she only had one abnormal blood pressure reading. She also advises her aches and pains are better and she thinks this has helped with her blood pressure readings. She denies any headache, blurry vision, or weakness.

## 2023-10-07 ENCOUNTER — Telehealth: Payer: Self-pay | Admitting: Cardiology

## 2023-10-07 NOTE — Telephone Encounter (Signed)
 Pt c/o Shortness Of Breath: STAT if SOB developed within the last 24 hours or pt is noticeably SOB on the phone  1. Are you currently SOB (can you hear that pt is SOB on the phone)? yes  2. How long have you been experiencing SOB? Yesterday   3. Are you SOB when sitting or when up moving around? both  4. Are you currently experiencing any other symptoms?  Dizziness   Call transferred

## 2023-10-07 NOTE — Telephone Encounter (Signed)
 Stat call transferred to triage. Patient reports she has been short of breath for greater than 24 hours. She reports she felt terrible yesterday, but today is a little better although she is audibly SOB on the phone. She reports SOB does get better with rest. She gives her BP as 139/71 and HR as 59 yesterday. She reports her BP is 156/71 today. She denies leg swelling, which is listed as chronic at her last visit in June, but says her belly feels full. Patient has history of cpap and endorses using her machine, also has CHF but not on diuretics at last visit 06/29/23 with Dr. Shlomo. Spoke with patient and dtr and advised they go to ED for evaluation. Patient and dtr verbalize understanding.

## 2023-10-11 ENCOUNTER — Emergency Department (HOSPITAL_COMMUNITY)

## 2023-10-11 ENCOUNTER — Emergency Department (HOSPITAL_COMMUNITY)
Admission: EM | Admit: 2023-10-11 | Discharge: 2023-10-11 | Disposition: A | Discharge status: Home / Self Care | Attending: Emergency Medicine | Admitting: Emergency Medicine

## 2023-10-11 ENCOUNTER — Other Ambulatory Visit: Payer: Self-pay

## 2023-10-11 DIAGNOSIS — Z7982 Long term (current) use of aspirin: Secondary | ICD-10-CM | POA: Diagnosis not present

## 2023-10-11 DIAGNOSIS — I110 Hypertensive heart disease with heart failure: Secondary | ICD-10-CM | POA: Diagnosis not present

## 2023-10-11 DIAGNOSIS — K5730 Diverticulosis of large intestine without perforation or abscess without bleeding: Secondary | ICD-10-CM | POA: Insufficient documentation

## 2023-10-11 DIAGNOSIS — I509 Heart failure, unspecified: Secondary | ICD-10-CM | POA: Diagnosis not present

## 2023-10-11 DIAGNOSIS — R0602 Shortness of breath: Secondary | ICD-10-CM | POA: Diagnosis present

## 2023-10-11 DIAGNOSIS — I2510 Atherosclerotic heart disease of native coronary artery without angina pectoris: Secondary | ICD-10-CM | POA: Diagnosis not present

## 2023-10-11 DIAGNOSIS — B379 Candidiasis, unspecified: Secondary | ICD-10-CM | POA: Insufficient documentation

## 2023-10-11 DIAGNOSIS — I700 Atherosclerosis of aorta: Secondary | ICD-10-CM | POA: Insufficient documentation

## 2023-10-11 LAB — CBC WITH DIFFERENTIAL/PLATELET
Abs Immature Granulocytes: 0.02 K/uL (ref 0.00–0.07)
Basophils Absolute: 0 K/uL (ref 0.0–0.1)
Basophils Relative: 1 %
Eosinophils Absolute: 0.1 K/uL (ref 0.0–0.5)
Eosinophils Relative: 1 %
HCT: 37 % (ref 36.0–46.0)
Hemoglobin: 11.9 g/dL — ABNORMAL LOW (ref 12.0–15.0)
Immature Granulocytes: 0 %
Lymphocytes Relative: 22 %
Lymphs Abs: 1.1 K/uL (ref 0.7–4.0)
MCH: 25.8 pg — ABNORMAL LOW (ref 26.0–34.0)
MCHC: 32.2 g/dL (ref 30.0–36.0)
MCV: 80.3 fL (ref 80.0–100.0)
Monocytes Absolute: 0.5 K/uL (ref 0.1–1.0)
Monocytes Relative: 10 %
Neutro Abs: 3.4 K/uL (ref 1.7–7.7)
Neutrophils Relative %: 66 %
Platelets: 328 K/uL (ref 150–400)
RBC: 4.61 MIL/uL (ref 3.87–5.11)
RDW: 17.7 % — ABNORMAL HIGH (ref 11.5–15.5)
WBC: 5.1 K/uL (ref 4.0–10.5)
nRBC: 0 % (ref 0.0–0.2)

## 2023-10-11 LAB — BASIC METABOLIC PANEL WITH GFR
Anion gap: 10 (ref 5–15)
BUN: 9 mg/dL (ref 8–23)
CO2: 23 mmol/L (ref 22–32)
Calcium: 9.3 mg/dL (ref 8.9–10.3)
Chloride: 97 mmol/L — ABNORMAL LOW (ref 98–111)
Creatinine, Ser: 0.73 mg/dL (ref 0.44–1.00)
GFR, Estimated: >60 mL/min (ref >60)
Glucose, Bld: 100 mg/dL — ABNORMAL HIGH (ref 70–99)
Potassium: 4.1 mmol/L (ref 3.5–5.1)
Sodium: 130 mmol/L — ABNORMAL LOW (ref 135–145)

## 2023-10-11 LAB — BRAIN NATRIURETIC PEPTIDE: B Natriuretic Peptide: 292.7 pg/mL — ABNORMAL HIGH (ref 0.0–100.0)

## 2023-10-11 LAB — TROPONIN I (HIGH SENSITIVITY)
Troponin I (High Sensitivity): 19 ng/L — ABNORMAL HIGH (ref <18)
Troponin I (High Sensitivity): 20 ng/L — ABNORMAL HIGH (ref <18)

## 2023-10-11 LAB — RESP PANEL BY RT-PCR (RSV, FLU A&B, COVID)  RVPGX2
Influenza A by PCR: NEGATIVE
Influenza B by PCR: NEGATIVE
Resp Syncytial Virus by PCR: NEGATIVE
SARS Coronavirus 2 by RT PCR: NEGATIVE

## 2023-10-11 LAB — D-DIMER, QUANTITATIVE: D-Dimer, Quant: 1.07 ug{FEU}/mL — ABNORMAL HIGH (ref 0.00–0.50)

## 2023-10-11 MED ORDER — NYSTATIN 100000 UNIT/GM EX CREA: TOPICAL_CREAM | CUTANEOUS | 0 refills | Status: AC

## 2023-10-11 MED ORDER — NYSTATIN 100000 UNIT/GM EX POWD
Freq: Once | CUTANEOUS | Status: AC
  Filled 2023-10-11: qty 15

## 2023-10-11 MED ORDER — IOHEXOL 350 MG/ML SOLN
75.0000 mL | Freq: Once | INTRAVENOUS | Status: AC | PRN
Start: 2023-10-11 — End: 2023-10-11
  Administered 2023-10-11: 75 mL via INTRAVENOUS

## 2023-10-11 MED ORDER — FUROSEMIDE 20 MG PO TABS: 20.0000 mg | ORAL_TABLET | Freq: Every day | ORAL | 0 refills | Status: DC

## 2023-10-11 NOTE — Discharge Instructions (Signed)
 You have been evaluated for your symptoms.  CT scan did show some edema in your lungs which likely contribute to your shortness of breath.  Please take Lasix  p.o. daily for the next 4 days to help alleviate the fluid retention.  You also have evidence of yeast infection involving your skin.  Apply nystatin  cream twice daily to affected area for the next week as treatment.  Follow-up closely with your doctor for further care.

## 2023-10-11 NOTE — ED Triage Notes (Signed)
 Patient arrives via [facility] for shob from home. Dyspnea with exertion x10 days. PCP referred her here. Hypertensive 160/90. No chest pain. Left sided lower flank area when laying. CBG 109. 96 on room air, lung sounds clear. Placed on 2L [place], improvement with that. 12 lead-- Left bundle. Hx of same. No depression. No N/V. Poor PO intake. Vaginal and rectal discomfort x1 month. Patient endorses concern for colon cancer. Alert and oriented x4.

## 2023-10-11 NOTE — ED Notes (Signed)
 Pt ambulated in hallway with pulse ox remaining 93-100% on room air. Pt walks with walker at baseline. Steady gait. Denies complaints. Pt reports SHOB no different that normal.

## 2023-10-11 NOTE — ED Notes (Signed)
 Daughter Ronal Rummer [phone] would like an update asap

## 2023-10-11 NOTE — ED Provider Notes (Signed)
 [place] EMERGENCY DEPARTMENT AT [facility] Provider Note   CSN: [id number] Arrival date & time: 10/11/23  1435     Patient presents with: Shortness of Breath   Sydney Booth is a 89 y.o. female.   The history is provided by the patient, medical records and the EMS personnel. No language interpreter was used.  Shortness of Breath    89 year old female history of hypertension, GERD, paroxysmal atrial fibrillation, OSA on CPAP, CAD brought here via EMS from home with complaints of shortness of breath.  For the past 10 days patient states she noticed progressive worsening shortness of breath.  Shortness of breath more noticeable with exertion.  For the past 2 to 3 days she also noted some mild discomfort to the left side of her chest.  No report of any fever or chills no lightheadedness or dizziness no productive cough or hemoptysis no nausea vomiting diarrhea or urinary symptoms.  She normally uses CPAP at home to sleep for her sleep apnea.  She denies any prior history of PE or DVT and no history of CHF.  Denies any flulike symptoms.  She does not use oxygen at home but when arrived EMS did place nasal cannula and patient states that makes her feel a bit better.  Patient also mention her blood pressure was elevated today when she checked it at home.  It was 160/90.  States this is much higher than her usual blood pressure.  She is compliant with her medication.  She also endorsed having vaginal and rectal discomfort for the past month but felt she should talk to her primary care doctor about it.  Prior to Admission medications   Medication Sig Start Date End Date Taking? Authorizing Provider  acetaminophen  (TYLENOL ) 325 MG tablet Take 2 tablets (650 mg total) by mouth every 6 (six) hours as needed for moderate pain. 08/09/20   Christopher Savannah, PA-C  Ascorbic Acid (VITAMIN C PO) Take 1,000 mg by mouth in the morning and at bedtime.    [provider]  aspirin  81 MG tablet Take  81 mg by mouth daily.    [provider]  calcium carbonate (OS-CAL) 600 MG TABS Take 600 mg by mouth daily.    [provider]  CINNAMON PO Take 500 mg by mouth 2 (two) times daily.    [provider]  ezetimibe  (ZETIA ) 10 MG tablet TAKE 1 TABLET BY MOUTH DAILY 05/12/22   Lorren Greig PARAS, NP  metoprolol  tartrate (LOPRESSOR ) 25 MG tablet TAKE 1 TABLET BY MOUTH TWICE  DAILY 05/12/22   Lorren Greig PARAS, NP  Omega-3 Fatty Acids (FISH OIL) 1000 MG CPDR Take 1,000 mg by mouth in the morning, at noon, and at bedtime.    [provider]  omeprazole  (PRILOSEC) 40 MG capsule TAKE 1 CAPSULE BY MOUTH DAILY 03/25/22   Lorren Greig PARAS, NP  QUERCETIN PO Take 2 capsules by mouth 3 (three) times daily as needed (Allergies).    [provider]  vitamin B-12 (CYANOCOBALAMIN) 1000 MCG tablet Take 1,000 mcg by mouth daily.    [provider]  VITAMIN D , CHOLECALCIFEROL, PO Take 1 tablet by mouth daily.    [provider]  Zinc  Acetate, Oral, (ZINC  ACETATE PO) Take 1 tablet by mouth.    [provider]    Allergies: Cymbalta  [duloxetine  hcl], Farxiga  [dapagliflozin ], Gabapentin , Statins, and Sulfa antibiotics    Review of Systems  Respiratory:  Positive for shortness of breath.   All  other systems reviewed and are negative.   Updated Vital Signs BP (!) 158/80 (BP Location: Right Arm)   Pulse 70   Temp 97.9 F (36.6 C) (Oral)   Resp 16   Ht 5' (1.524 m)   Wt 90.7 kg   SpO2 98%   BMI 39.06 kg/m   Physical Exam Vitals and nursing note reviewed.  Constitutional:      General: She is not in acute distress.    Appearance: She is well-developed. She is obese.     Comments: Patient is laying bed, wearing supplemental oxygen, speaking in complete sentence and appears to be in no acute respiratory discomfort.  HENT:     Head: Atraumatic.  Eyes:     Conjunctiva/sclera: Conjunctivae normal.  Cardiovascular:     Rate and Rhythm: Normal rate  and regular rhythm.  Pulmonary:     Effort: Pulmonary effort is normal.     Breath sounds: No wheezing, rhonchi or rales.  Chest:     Chest wall: No tenderness.  Abdominal:     Palpations: Abdomen is soft.     Tenderness: There is no abdominal tenderness.  Genitourinary:    Comments: Chaperone present during exam.  Patient does have some macerated skin changes noted to lower abdominal fold suggestive of candidal infection.  Will treat with nystatin  cream.  Vaginal exam without any obvious signs of skin irritation or concerning rash. Musculoskeletal:     Cervical back: Neck supple.     Right lower leg: Edema present.     Left lower leg: Edema present.  Skin:    Findings: No rash.  Neurological:     Mental Status: She is alert. Mental status is at baseline.  Psychiatric:        Mood and Affect: Mood normal.     (all labs ordered are listed, but only abnormal results are displayed) Labs Reviewed  BASIC METABOLIC PANEL WITH GFR - Abnormal; Notable for the following components:      Result Value   Sodium 130 (*)    Chloride 97 (*)    Glucose, Bld 100 (*)    All other components within normal limits  CBC WITH DIFFERENTIAL/PLATELET - Abnormal; Notable for the following components:   Hemoglobin 11.9 (*)    MCH 25.8 (*)    RDW 17.7 (*)    All other components within normal limits  BRAIN NATRIURETIC PEPTIDE - Abnormal; Notable for the following components:   B Natriuretic Peptide 292.7 (*)    All other components within normal limits  D-DIMER, QUANTITATIVE - Abnormal; Notable for the following components:   D-Dimer, Quant 1.07 (*)    All other components within normal limits  TROPONIN I (HIGH SENSITIVITY) - Abnormal; Notable for the following components:   Troponin I (High Sensitivity) 19 (*)    All other components within normal limits  TROPONIN I (HIGH SENSITIVITY) - Abnormal; Notable for the following components:   Troponin I (High Sensitivity) 20 (*)    All other components  within normal limits  RESP PANEL BY RT-PCR (RSV, FLU A&B, COVID)  [id number]    EKG: EKG Interpretation Date/Time:  Monday October 11 2023 15:45:46 EDT Ventricular Rate:  69 PR Interval:  230 QRS Duration:  152 QT Interval:  478 QTC Calculation: 513 R Axis:   8  Text Interpretation: Sinus rhythm Prolonged PR interval Left bundle branch block Confirmed by Patsey Lot [id number]) on 10/11/2023 7:45:00 PM  Radiology: CT ABDOMEN PELVIS W CONTRAST Result Date: 10/11/2023 CLINICAL  DATA:  89 year old with abdominal pain. EXAM: CT ABDOMEN AND PELVIS WITH CONTRAST TECHNIQUE: Multidetector CT imaging of the abdomen and pelvis was performed using the standard protocol following bolus administration of intravenous contrast. RADIATION DOSE REDUCTION: This exam was performed according to the departmental dose-optimization program which includes automated exposure control, adjustment of the mA and/or kV according to patient size and/or use of iterative reconstruction technique. CONTRAST:  75mL OMNIPAQUE IOHEXOL 350 MG/ML SOLN COMPARISON:  None Available. FINDINGS: Lower chest: Assessed on concurrent chest CT, reported separately. Hepatobiliary: No focal liver abnormality is seen. No gallstones, gallbladder wall thickening, or biliary dilatation. Pancreas: Unremarkable. No pancreatic ductal dilatation or surrounding inflammatory changes. Spleen: Normal in size without focal abnormality. Adrenals/Urinary Tract: No adrenal nodule. Eighty no hydronephrosis. No evidence of renal inflammation. No renal calculi or suspicious renal lesion. Urinary bladder is moderately distended. No bladder wall thickening. Stomach/Bowel: Unremarkable appearance of the stomach. No small bowel distension or inflammation. Normal appendix. Small-moderate volume of formed stool in the colon. Scattered colonic diverticula without diverticulitis. Vascular/Lymphatic: Aortic atherosclerosis. No aortic aneurysm. No abdominopelvic adenopathy.  Reproductive: Uterus and bilateral adnexa are unremarkable. Other: No free air, free fluid, or intra-abdominal fluid collection. Musculoskeletal: Scoliosis and degenerative change in the spine. Chronic bilateral hip arthropathy. There are no acute or suspicious osseous abnormalities. IMPRESSION: 1. No acute abnormality in the abdomen/pelvis. 2. Colonic diverticulosis without diverticulitis. Aortic Atherosclerosis (ICD10-I70.0). Electronically Signed   By: Andrea Gasman M.D.   On: 10/11/2023 19:25   CT Angio Chest PE W and/or Wo Contrast Result Date: 10/11/2023 CLINICAL DATA:  Provided history: Pulmonary embolism (PE) suspected, low to intermediate prob, positive D-dimer Shortness of breath. EXAM: CT ANGIOGRAPHY CHEST WITH CONTRAST TECHNIQUE: Multidetector CT imaging of the chest was performed using the standard protocol during bolus administration of intravenous contrast. Multiplanar CT image reconstructions and MIPs were obtained to evaluate the vascular anatomy. RADIATION DOSE REDUCTION: This exam was performed according to the departmental dose-optimization program which includes automated exposure control, adjustment of the mA and/or kV according to patient size and/or use of iterative reconstruction technique. CONTRAST:  75mL OMNIPAQUE IOHEXOL 350 MG/ML SOLN COMPARISON:  Radiograph earlier today.  Chest CT 04/30/2020 FINDINGS: Cardiovascular: There are no filling defects within the pulmonary arteries to suggest pulmonary embolus. Aortic atherosclerosis and tortuosity. No evidence of acute aortic finding allowing for phase of contrast. There are coronary artery and mitral annulus calcifications. The heart is normal in size. No pericardial effusion. Mediastinum/Nodes: No suspicious lymphadenopathy. Shoddy mediastinal nodes are all subcentimeter. Patulous mid esophagus. No esophageal wall thickening. Lungs/Pleura: Heterogeneous pulmonary parenchyma. Areas of ground-glass and diffuse septal thickening. No  confluent opacity. Small left pleural effusion. No pulmonary mass. Upper Abdomen: Assessed fully on concurrent abdominopelvic CT, reported separately. Musculoskeletal: Chronic bilateral shoulder arthropathy with joint effusions. Thoracic spondylosis. Review of the MIP images confirms the above findings. IMPRESSION: 1. No pulmonary embolus. 2. Heterogeneous pulmonary parenchyma with areas of ground-glass and diffuse septal thickening, suspicious for pulmonary edema. Atypical infection could appear similarly. Clinical correlation is advised. 3. Small left pleural effusion. 4. Coronary artery calcifications. Aortic Atherosclerosis (ICD10-I70.0). Electronically Signed   By: Andrea Gasman M.D.   On: 10/11/2023 19:19   DG Chest 2 View Result Date: 10/11/2023 EXAM: 2 VIEW(S) XRAY OF THE CHEST 10/11/2023 04:46:00 PM COMPARISON: 04/19/2023 CLINICAL HISTORY: SOB for 10 days. FINDINGS: LUNGS AND PLEURA: Coarsened interstitial markings without pulmonary edema. No focal pulmonary opacity. No pleural effusion. No pneumothorax. HEART AND MEDIASTINUM: Mild enlargement of the cardiopericardial  silhouette. Atherosclerotic plaque noted. BONES AND SOFT TISSUES: Degenerative changes of bilateral shoulders. No acute osseous abnormality. IMPRESSION: 1. Coarsened interstitial markings without pulmonary edema. 2. Mild enlargement of the cardiopericardial silhouette. 3. Degenerative changes of bilateral shoulders. 4. Atherosclerotic plaque. Electronically signed by: Ryan Salvage MD 10/11/2023 05:20 PM EDT RP Workstation: [id number]     Procedures   Medications Ordered in the ED  nystatin  (MYCOSTATIN /NYSTOP ) topical powder (has no administration in time range)  iohexol (OMNIPAQUE) 350 MG/ML injection 75 mL (75 mLs Intravenous Contrast Given 10/11/23 1842)                                    Medical Decision Making Amount and/or Complexity of Data Reviewed Labs: ordered. Radiology: ordered.  Risk Prescription drug  management.   BP (!) 158/80 (BP Location: Right Arm)   Pulse 70   Temp 97.9 F (36.6 C) (Oral)   Resp 16   Ht 5' (1.524 m)   Wt 90.7 kg   SpO2 98%   BMI 39.06 kg/m   75:62 PM  89 year old female history of hypertension, GERD, paroxysmal atrial fibrillation, OSA on CPAP, CAD brought here via EMS from home with complaints of shortness of breath.  For the past 10 days patient states she noticed progressive worsening shortness of breath.  Shortness of breath more noticeable with exertion.  For the past 2 to 3 days she also noted some mild discomfort to the left side of her chest.  No report of any fever or chills no lightheadedness or dizziness no productive cough or hemoptysis no nausea vomiting diarrhea or urinary symptoms.  She normally uses CPAP at home to sleep for her sleep apnea.  She denies any prior history of PE or DVT and no history of CHF.  Denies any flulike symptoms.  She does not use oxygen at home but when arrived EMS did place nasal cannula and patient states that makes her feel a bit better.  Patient also mention her blood pressure was elevated today when she checked it at home.  It was 160/90.  States this is much higher than her usual blood pressure.  She is compliant with her medication.  She also endorsed having vaginal and rectal discomfort for the past month but felt she should talk to her primary care doctor about it.  On exam patient is wearing supplemental oxygen and she appears to be in no acute respiratory discomfort.  She is able to speak in complete sentences.  Her lungs are clear.  Some peripheral edema noted to bilateral extremities but difficult to appreciate due to body habitus.  There are no wheezes or rales on exam.  EMR reviewed, patient has an echocardiogram that was done in April of this year show an EF of 60 to 65%  -Labs ordered, independently viewed and interpreted by me.  Labs remarkable for initial trop of 19, D-dimer elevated at 1.07 and BNP is  292.7 -The patient was maintained on a cardiac monitor.  I personally viewed and interpreted the cardiac monitored which showed an underlying rhythm of: NSR -Imaging independently viewed and interpreted by me and I agree with radiologist's interpretation.  Result remarkable for chest cta along with abd/pelvis CT without concerning finding.  Some evidence of pleural edema or possible atypical infection however pt without fever or cough to suggest pneumonia -This patient presents to the ED for concern of sob, this involves an extensive number  of treatment options, and is a complaint that carries with it a high risk of complications and morbidity.  The differential diagnosis includes pna, pe, acs, pleurisy, copd -Co morbidities that complicate the patient evaluation includes chf, afib, osa, gerd -Treatment includes supplemental O2 -Reevaluation of the patient after these medicines showed that the patient improved -PCP office notes or outside notes reviewed -Discussion with attending Dr. Patsey.  -Escalation to admission/observation considered: patients feels much better, is comfortable with discharge, and will follow up with PCP -Prescription medication considered, patient comfortable with nystatin  and lasix  -Social Determinant of Health considered       Final diagnoses:  Acute congestive heart failure, unspecified heart failure type [facility])  Candidiasis    ED Discharge Orders          Ordered    furosemide  (LASIX ) 20 MG tablet  Daily        10/11/23 2039    nystatin  cream (MYCOSTATIN )        10/11/23 2039               Nivia Colon, PA-C 10/11/23 2040    Patsey Lot, MD 10/11/23 2231

## 2023-10-21 ENCOUNTER — Ambulatory Visit: Attending: Cardiology | Admitting: Emergency Medicine

## 2023-10-21 ENCOUNTER — Other Ambulatory Visit: Payer: Self-pay

## 2023-10-21 ENCOUNTER — Encounter: Payer: Self-pay | Admitting: Emergency Medicine

## 2023-10-21 VITALS — BP 138/82 | HR 69 | Ht 60.0 in | Wt 205.4 lb

## 2023-10-21 DIAGNOSIS — Z79899 Other long term (current) drug therapy: Secondary | ICD-10-CM | POA: Diagnosis present

## 2023-10-21 DIAGNOSIS — I2510 Atherosclerotic heart disease of native coronary artery without angina pectoris: Secondary | ICD-10-CM | POA: Diagnosis present

## 2023-10-21 DIAGNOSIS — G4733 Obstructive sleep apnea (adult) (pediatric): Secondary | ICD-10-CM | POA: Diagnosis present

## 2023-10-21 DIAGNOSIS — I471 Supraventricular tachycardia, unspecified: Secondary | ICD-10-CM | POA: Diagnosis present

## 2023-10-21 DIAGNOSIS — I350 Nonrheumatic aortic (valve) stenosis: Secondary | ICD-10-CM | POA: Insufficient documentation

## 2023-10-21 DIAGNOSIS — R55 Syncope and collapse: Secondary | ICD-10-CM | POA: Diagnosis present

## 2023-10-21 DIAGNOSIS — I10 Essential (primary) hypertension: Secondary | ICD-10-CM | POA: Insufficient documentation

## 2023-10-21 DIAGNOSIS — E782 Mixed hyperlipidemia: Secondary | ICD-10-CM | POA: Insufficient documentation

## 2023-10-21 DIAGNOSIS — I340 Nonrheumatic mitral (valve) insufficiency: Secondary | ICD-10-CM | POA: Diagnosis present

## 2023-10-21 DIAGNOSIS — I5032 Chronic diastolic (congestive) heart failure: Secondary | ICD-10-CM | POA: Insufficient documentation

## 2023-10-21 MED ORDER — POTASSIUM CHLORIDE CRYS ER 10 MEQ PO TBCR: EXTENDED_RELEASE_TABLET | ORAL | 3 refills | Status: DC

## 2023-10-21 MED ORDER — POTASSIUM CHLORIDE CRYS ER 10 MEQ PO TBCR: EXTENDED_RELEASE_TABLET | ORAL | 3 refills | Status: AC

## 2023-10-21 MED ORDER — FUROSEMIDE 20 MG PO TABS: 20.0000 mg | ORAL_TABLET | Freq: Every day | ORAL | 3 refills | Status: DC

## 2023-10-21 MED ORDER — FUROSEMIDE 20 MG PO TABS: 20.0000 mg | ORAL_TABLET | Freq: Every day | ORAL | 3 refills | Status: AC

## 2023-10-21 NOTE — Progress Notes (Signed)
 Cardiology Office Note:    Date:  10/21/2023  ID:  Sydney Booth, DOB 12-06-1931, MRN [id number] PCP: Rexanne Ingle, MD  [facility] Providers Cardiologist:  Wilbert Bihari, MD       Patient Profile:       Chief Complaint: ED follow-up History of Present Illness:  Sydney Booth is a 89 y.o. female with visit-pertinent history of hypertension, hyperlipidemia, OSA on CPAP, coronary calcium on CT, chronic diastolic heart failure  TTE 12/2020 with LVEF 60 to 65%, normal RV, moderate LAE, mild to moderate RAE, mild MR, severe MAC.  Myoview  12/2020 with normal perfusion, normal EF.  Seen in clinic on 07/27/2022 by Dr. Hobart.  Noted she did not tolerate SGLT2i due to dizziness and constipation.  She was doing well without significant chest pains.  Has chronic dyspnea else unchanged.  Her medication management was continued.  She was admitted to the hospital 04/19/2023 for syncope.  MRI of the brain did not show acute findings.  Her EEG did not show seizure.  EKG did not show any signs of ischemic changes.  Echocardiogram 04/2023 showed LVEF 60 to 65%, no RWMA, grade 2 DD, RV function and size normal, left atrial size moderately dilated, mild mitral valve regurgitation, mild mitral stenosis, severe MAC, mild aortic stenosis with mean gradient 13 mmHg.  Syncope thought to be likely orthostatic.  She was discharged home in stable condition.  She was last seen in clinic on 06/29/2023 by Dr. Bihari.  She was doing well without acute concerns.  No medication changes were made.  No further episodes of syncope.  ZIO monitor was ordered showing predominant rhythm was sinus rhythm with average heart rate of 68 bpm.  There were 57 episodes of SVT consistent with nonsustained atrial tachycardia with longest episode lasting 14 beats and fastest heart rate of 158 bpm.  Most recently she was seen in the ED on 10/11/2023.  She was brought to the emergency room via EMS with complaints of shortness of  breath for the past 10 days.  Shortness of breath have been present over the past 10 days.  BNP was elevated at 292.7.  D-dimer was elevated at 1.07.  High sensitive troponins were 19, 20.  CT angio chest PE showed no pulmonary embolus and small left pleural effusion.  CT abdomen pelvis without acute abnormalities.  She was discharged home on Lasix  20 mg daily.   Discussed the use of AI scribe software for clinical note transcription with the patient, who gave verbal consent to proceed.  History of Present Illness Sydney Booth is a 89 year old female with diastolic heart failure who presents for follow-up after a recent emergency department visit for shortness of breath. She is accompanied by her daughter.  Today she is doing well without acute concerns or complaints.  Reports her shortness of breath is chronic however has improved since starting Lasix .  Lasix  was prescribed for four days, leading to some improvement.  She denies any chest pains or exertional symptoms.  She reports chronic LEE that is unchanged.  No recent weight gain, orthopnea, or PND.  No further episodes of syncope or presyncope.  She uses a CPAP machine for sleep apnea and recently received a new, more comfortable mask.   Review of systems:  Please see the history of present illness. All other systems are reviewed and otherwise negative.      Studies Reviewed:        Zio 06/29/2023   Predominant rhythm  was normal sinus rhythm with an average heart rate of 68 bpm and ranged from 51-100 and   There were 57 episodes of SVT consistent with nonsustained atrial tachycardia with the longest episode lasting 14 beats and the fastest heart rate 158 bpm   Rare PACs, atrial couplets and triplets   Rare PVCs with PVC load less than 1%.  Ventricular bigeminy was present     Patch Wear Time:  13 days and 23 hours (2025-06-24T08:55:37-0400 to 2025-07-08T08:55:29-0400)   Patient had a min HR of 51 bpm, max HR of 158 bpm, and avg  HR of 68 bpm. Predominant underlying rhythm was Sinus Rhythm. First Degree AV Block was present. Bundle Branch Block/IVCD was present. 57 Supraventricular Tachycardia runs occurred, the run with  the fastest interval lasting 7 beats with a max rate of 158 bpm, the longest lasting 14 beats with an avg rate of 102 bpm. Isolated SVEs were rare (<1.0%), SVE Couplets were rare (<1.0%), and SVE Triplets were rare (<1.0%). Isolated VEs were rare  (<1.0%), and no VE Couplets or VE Triplets were present. Ventricular Bigeminy was present.  Echocardiogram 04/19/2023 1. Left ventricular ejection fraction, by estimation, is 60 to 65%. The  left ventricle has normal function. The left ventricle has no regional  wall motion abnormalities. Left ventricular diastolic parameters are  consistent with Grade II diastolic  dysfunction (pseudonormalization). Elevated left atrial pressure.   2. Right ventricular systolic function is normal. The right ventricular  size is normal.   3. Left atrial size was moderately dilated.   4. The mitral valve is degenerative. Mild mitral valve regurgitation.  Mild mitral stenosis. The mean mitral valve gradient is 4.0 mmHg. Severe  mitral annular calcification.   5. The aortic valve is normal in structure. Aortic valve regurgitation is  not visualized. Mild aortic valve stenosis. Aortic valve mean gradient  measures 13.0 mmHg. Aortic valve Vmax measures 2.26 m/s.   6. The inferior vena cava is normal in size with greater than 50%  respiratory variability, suggesting right atrial pressure of 3 mmHg.   Echocardiogram 08/24/2022  1. Left ventricular ejection fraction, by estimation, is 60 to 65%. The  left ventricle has normal function. The left ventricle has no regional  wall motion abnormalities. There is moderate left ventricular hypertrophy.  Left ventricular diastolic  parameters are consistent with Grade II diastolic dysfunction  (pseudonormalization). Elevated left atrial  pressure.   2. Right ventricular systolic function is normal. The right ventricular  size is normal. There is normal pulmonary artery systolic pressure. The  estimated right ventricular systolic pressure is 31.5 mmHg.   3. Left atrial size was mildly dilated.   4. The mitral valve is degenerative. Moderate mitral valve regurgitation.  Mild mitral stenosis. The mean mitral valve gradient is 4.0 mmHg with  average heart rate of 62 bpm. Severe mitral annular calcification.   5. The aortic valve was not well visualized. Aortic valve regurgitation  is not visualized. Aortic valve sclerosis/calcification is present,  without any evidence of aortic stenosis.   6. The inferior vena cava is normal in size with greater than 50%  respiratory variability, suggesting right atrial pressure of 3 mmHg.   Lexiscan  Myoview  12/27/2020   Findings are consistent with no prior ischemia and no prior myocardial infarction. The study is low risk.   No ST deviation was noted.   Left ventricular function is normal. Nuclear stress EF: 63 %. The left ventricular ejection fraction is normal (55-65%). End diastolic cavity size  is normal. End systolic cavity size is normal.   Prior study not available for comparison.   Findings: Negative for stress induced arrhythmias. Baseline LBBB (pharmacologic test).  There is slight decrease in apical counts in the rest that improves with stress.  No evidence of ischemia or infarction. Risk Assessment/Calculations:              Physical Exam:   VS:  BP 138/82   Pulse 69   Ht 5' (1.524 m)   Wt 205 lb 6.4 oz (93.2 kg)   SpO2 96%   BMI 40.11 kg/m    Wt Readings from Last 3 Encounters:  10/21/23 205 lb 6.4 oz (93.2 kg)  10/11/23 200 lb (90.7 kg)  06/29/23 212 lb 6.4 oz (96.3 kg)    GEN: Well nourished, well developed in no acute distress NECK: No JVD; No carotid bruits CARDIAC: RRR, no murmurs, rubs, gallops RESPIRATORY:  Clear to auscultation without rales, wheezing  or rhonchi  ABDOMEN: Soft, non-tender, non-distended EXTREMITIES:  No edema; No acute deformity      Assessment and Plan:  Chronic diastolic HF Seen in ED on 10/2023 found to be in acute CHF exacerbation and subsequently started on furosemide .  Was not admitted BNP 292.7 and CT chest showed small left pleural effusion - Today she appears euvolemic on exam - Today she reports improvement in shortness of breath which is back to her baseline.  No LEE, weight gain, orthopnea, PND - She is well compensated - Will continue furosemide  20 mg daily and add on potassium supplementation - Continue metoprolol  25 mg twice daily - BNP and BMET today  Coronary artery calcification Lexiscan  Myoview  12/2020 low risk study without ischemia or infarction - Today she is stable without anginal chest pains.  No symptoms to suggest active angina.  Indication further ischemic evaluation at this time - She is statin intolerant - Continue aspirin  81 mg daily, ezetimibe  10 mg daily, and metoprolol  tartrate 25 mg twice daily  Hyperlipidemia, LDL goal <70 LDL 95 in 01/2023 - Statin intolerant - Given advanced age we will not proceed with PCSK9i - Continue Zetia  10 mg daily  Mitral regurgitation Mitral stenosis Echocardiogram 04/2023 showed mild mitral valve regurgitation and mild mitral valve stenosis with mean gradient of 4 mmHg - She is pending follow-up echocardiogram on 04/2024  Aortic stenosis Echocardiogram 04/2023 showed mild aortic stenosis with mean gradient 13 mmHg - Today she is without chest pains, syncope, or dizziness.  Chronic shortness of breath is stable - She is pending follow-up echocardiogram in 04/2024  Pulmonary hypertension Echocardiogram 04/2023 with normal PASP  Obstructive sleep apnea - She remains adherent to CPAP therapy  Hypertension Blood pressure today well-controlled at 138/82 - Continue current antihypertensive regimen  Syncope Admitted 04/2023 for syncope thought to be  orthostatic ZIO 06/2023 showed 57 episodes of SVT with longest episode lasting 14 beats and fastest heart rate 158 bpm - Today she is without further episodes of syncope or presyncope - Stable.  Will continue to clinically monitor  SVT Zio 06/2023 showed 57 episodes of SVT consistent with nonsustained atrial tachycardia -Today she denies any palpitations.  Seems well-controlled - Continue metoprolol  to tartrate 25 mg twice daily      Dispo:  Return in about 6 months (around 04/20/2024).  Signed, Lum LITTIE Louis, NP

## 2023-10-21 NOTE — Patient Instructions (Addendum)
 Medication Instructions:  RESTART YOUR LASIX  20 MG DAILY. START TAKING POTASSIUM CHLORIDE  10 MEQ DAILY WITH YOUR LASIX .  Lab Work: BNP AND BMET TO BE DONE TODAY.  Testing/Procedures: NONE   Follow-Up: At [facility], you and your health needs are our priority.  As part of our continuing mission to provide you with exceptional heart care, our providers are all part of one team.  This team includes your primary Cardiologist (physician) and Advanced Practice Providers or APPs (Physician Assistants and Nurse Practitioners) who all work together to provide you with the care you need, when you need it.  Your next appointment:   6 MONTHS  Provider:   Wilbert Bihari, MD (ONLY)

## 2023-10-22 LAB — BASIC METABOLIC PANEL WITH GFR
BUN/Creatinine Ratio: 14 (ref 12–28)
BUN: 10 mg/dL (ref 10–36)
CO2: 21 mmol/L (ref 20–29)
Calcium: 9.5 mg/dL (ref 8.7–10.3)
Chloride: 94 mmol/L — ABNORMAL LOW (ref 96–106)
Creatinine, Ser: 0.7 mg/dL (ref 0.57–1.00)
Glucose: 89 mg/dL (ref 70–99)
Potassium: 4.8 mmol/L (ref 3.5–5.2)
Sodium: 132 mmol/L — ABNORMAL LOW (ref 134–144)
eGFR: 82 mL/min/1.73 (ref >59)

## 2023-10-22 LAB — BRAIN NATRIURETIC PEPTIDE: BNP: 157.1 pg/mL — ABNORMAL HIGH (ref 0.0–100.0)

## 2023-10-25 ENCOUNTER — Ambulatory Visit: Payer: Self-pay | Admitting: Emergency Medicine

## 2023-11-15 ENCOUNTER — Ambulatory Visit: Admitting: Cardiology

## 2023-12-03 IMAGING — MR MR CERVICAL SPINE W/O CM
4 of 5 series · 27 of 48 positions shown · non-contrast
Comparison: 11/05/2009

CLINICAL DATA: Neck pain, numbness in the fingers for 6-8 months

EXAM:
MRI CERVICAL SPINE WITHOUT CONTRAST
TECHNIQUE: Multiplanar, multisequence MR imaging of the cervical spine was
performed. No intravenous contrast was administered.

[Series 5: T2 · sagittal · 3.0mm · 0.55mm/px · 6 of 15 slices shown (1 of 2)]
[im 1/15]
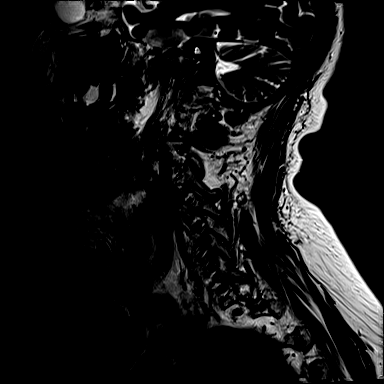
[im 3/15]
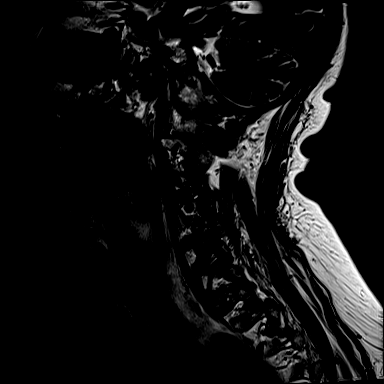
[im 6/15]
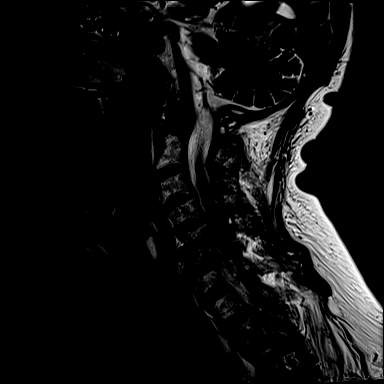
[im 9/15]
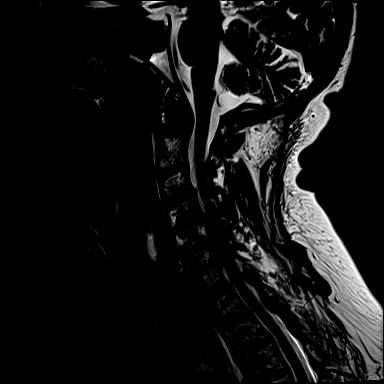
[im 12/15]
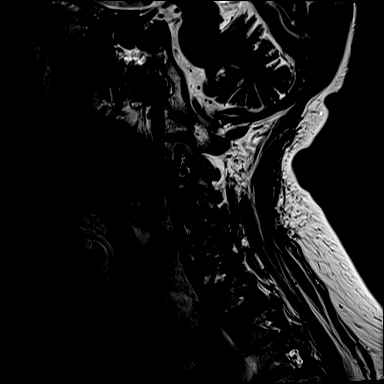
[im 15/15]
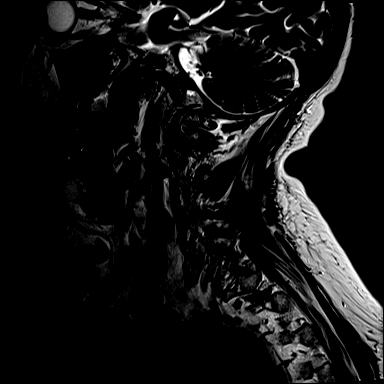

[Series 6: T1 · sagittal · 3.0mm · 0.66mm/px · 7 of 15 slices shown]
[im 1/15]
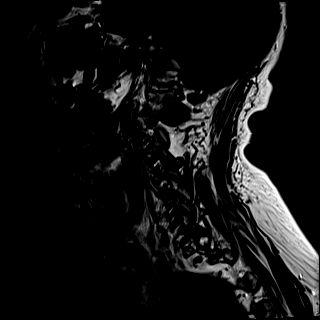
[im 3/15]
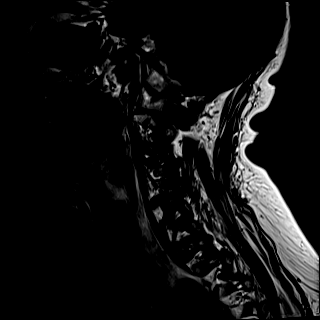
[im 5/15]
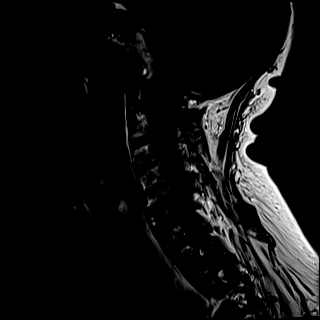
[im 8/15]
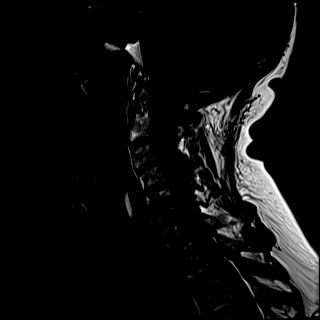
[im 10/15]
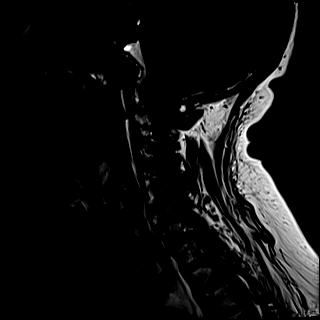
[im 12/15]
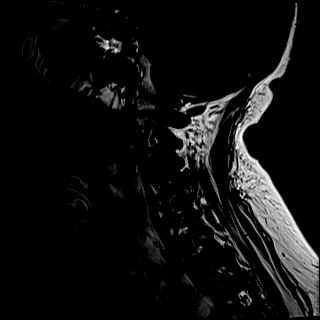
[im 15/15]
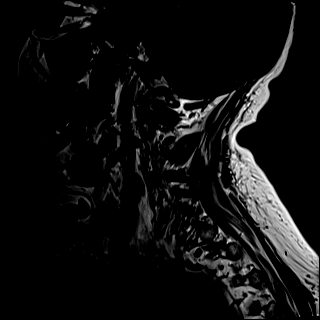

[Series 7: STIR · sagittal · 3.0mm · 0.33mm/px · 6 of 15 slices shown]
[im 1/15]
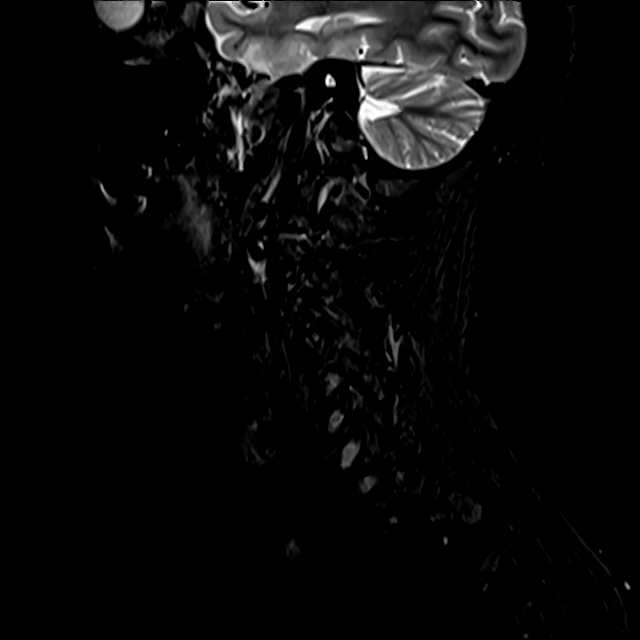
[im 3/15]
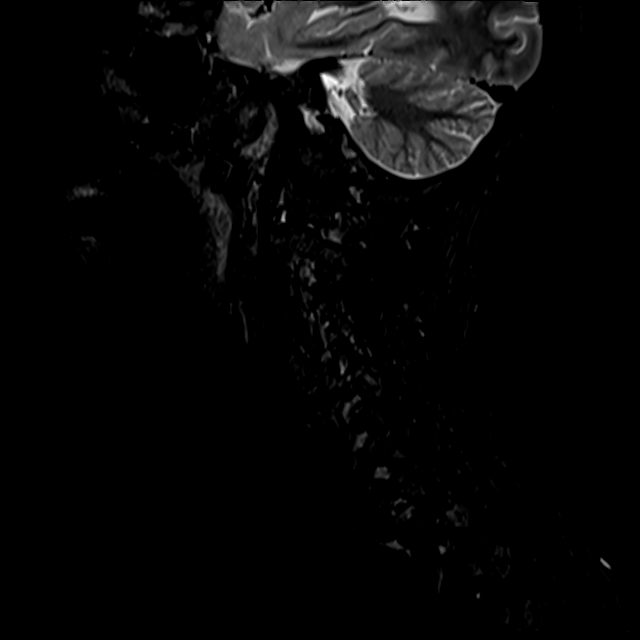
[im 5/15]
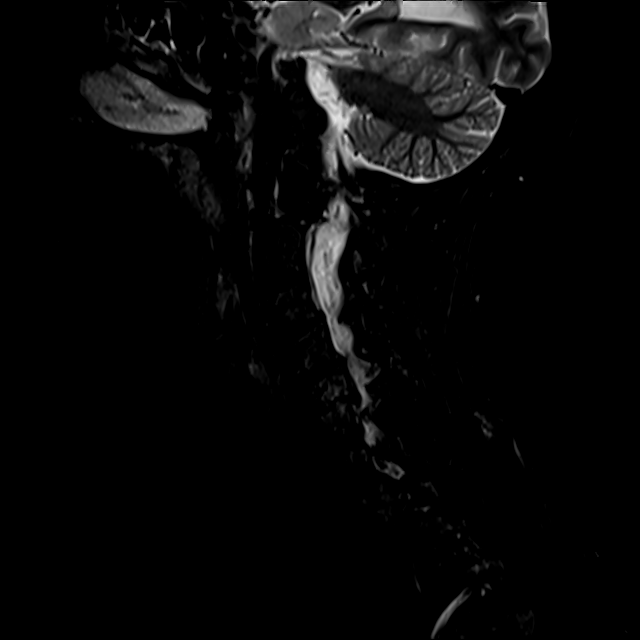
[im 8/15]
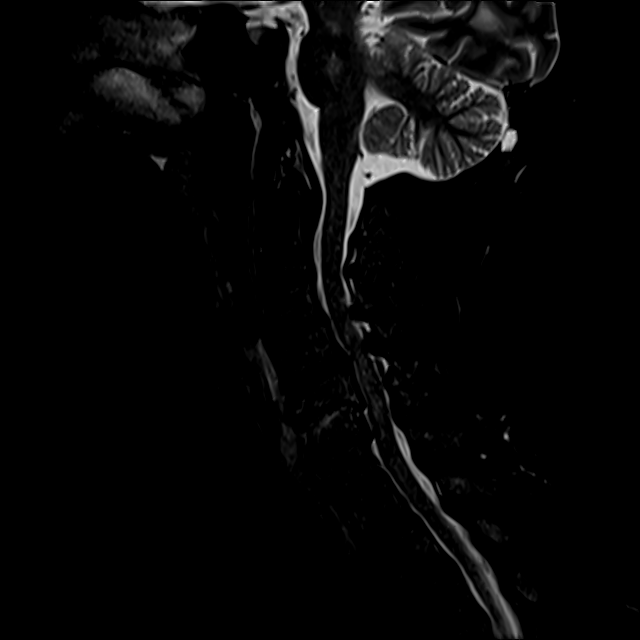
[im 10/15]
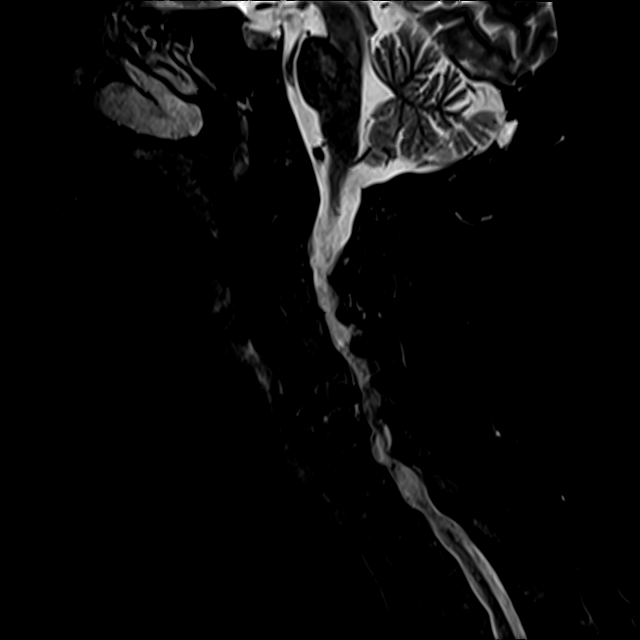
[im 12/15]
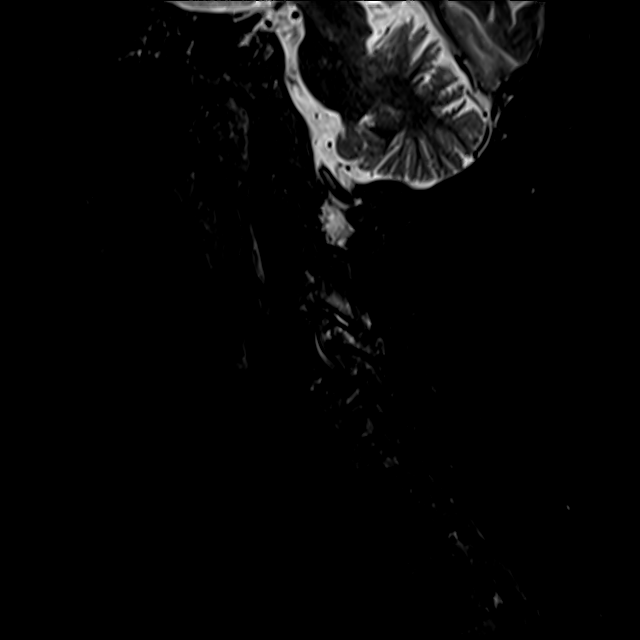

[Series 8: T2 · axial · 3.0mm · 0.50mm/px · z∈[-42,+49]mm · 8 of 30 slices shown (2 of 2)]
[im 1/30]
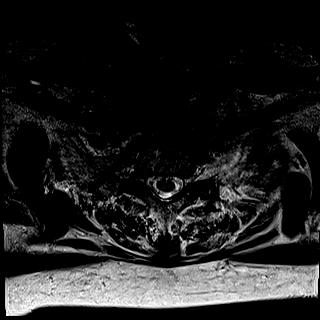
[im 5/30]
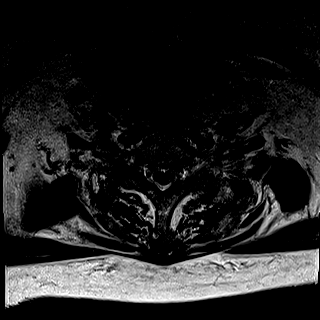
[im 9/30]
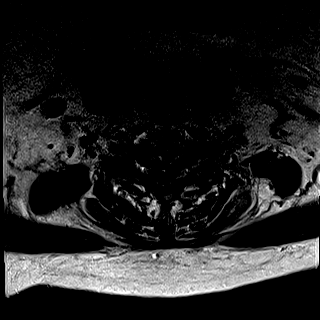
[im 14/30]
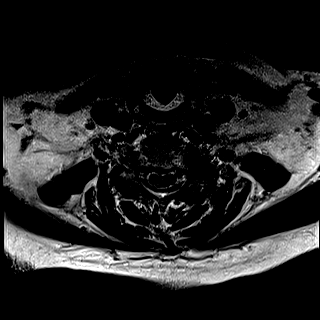
[im 16/30]
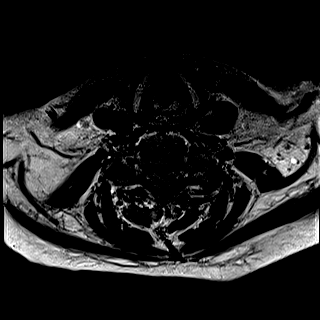
[im 21/30]
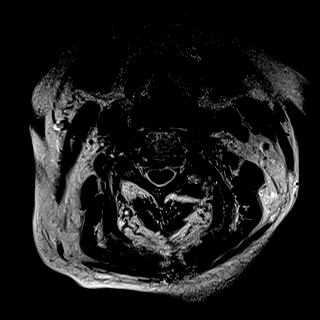
[im 25/30]
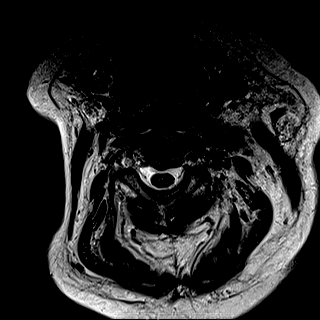
[im 30/30]
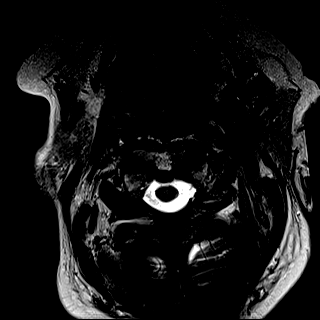

[27 of 48 positions shown; findings below may reference images not displayed]

FINDINGS: Alignment: 3 mm anterolisthesis of C4 on C5. 2 mm anterolisthesis of
C7 on T1. 3 mm retrolisthesis of C5 on C6. minimal anterolisthesis
of T1 on T2 and T2 on T3.

Vertebrae: No acute fracture, evidence of discitis, or bone lesion.

Cord: Normal signal and morphology.

Posterior Fossa, vertebral arteries, paraspinal tissues: Posterior
fossa demonstrates no focal abnormality. Vertebral artery flow voids
are maintained. Paraspinal soft tissues are unremarkable.

Disc levels:

Discs: Degenerative disease with disc height loss at C5-6, C6-7 and
to lesser extent C4-5, T1-2, and T2-3.

C2-3: Mild broad-based disc osteophyte complex. Moderate left facet
arthropathy. Mild left foraminal stenosis. No right foraminal
stenosis. No central canal stenosis.

C3-4: Mild broad-based disc bulge. Mild bilateral facet arthropathy.
Bilateral uncovertebral degenerative changes. Moderate right
foraminal stenosis. No left foraminal stenosis. Mild spinal
stenosis.

C4-5: Broad-based disc bulge with a broad central disc protrusion
deforming the ventral cervical spinal cord. Severe left and mild
right facet arthropathy. Bilateral uncovertebral degenerative
changes. Mild right foraminal stenosis. Severe left foraminal
stenosis. Severe spinal stenosis.

C5-6: No disc protrusion. Mild bilateral facet arthropathy.
Bilateral uncovertebral degenerative changes. Moderate left and
severe right foraminal stenosis. Mild spinal stenosis.

C6-7: Mild broad-based disc bulge. Bilateral uncovertebral
degenerative changes. Mild bilateral facet arthropathy. Mild right
foraminal stenosis. Moderate left foraminal stenosis. Mild spinal
stenosis.

C7-T1: Mild broad-based disc bulge. Mild bilateral facet
arthropathy. No foraminal or central canal stenosis.

T1-2: Mild broad-based disc bulge. Moderate bilateral facet
arthropathy. Mild bilateral foraminal stenosis.

T2-3: Mild broad-based disc bulge. Moderate bilateral facet
arthropathy. Mild right foraminal stenosis. No left foraminal
stenosis.
IMPRESSION: 1. Diffuse cervical spine spondylosis as described above, which has
progressed compared with the prior exam.
2.  No acute osseous injury of the cervical spine.

## 2024-04-28 ENCOUNTER — Other Ambulatory Visit (HOSPITAL_COMMUNITY)
